# Patient Record
Sex: Female | Born: 1950 | Race: White | Hispanic: No | Marital: Married | Smoking: Never smoker
Health system: Southern US, Community
[De-identification: ages and names within clinical notes are randomized; demographics above are authoritative.]

## PROBLEM LIST (undated history)

## (undated) DIAGNOSIS — I429 Cardiomyopathy, unspecified: Secondary | ICD-10-CM

## (undated) DIAGNOSIS — I4891 Unspecified atrial fibrillation: Secondary | ICD-10-CM

## (undated) DIAGNOSIS — M8580 Other specified disorders of bone density and structure, unspecified site: Secondary | ICD-10-CM

## (undated) DIAGNOSIS — E785 Hyperlipidemia, unspecified: Secondary | ICD-10-CM

## (undated) DIAGNOSIS — I10 Essential (primary) hypertension: Secondary | ICD-10-CM

## (undated) HISTORY — PX: AUGMENTATION MAMMAPLASTY: SUR837

## (undated) HISTORY — DX: Hyperlipidemia, unspecified: E78.5

## (undated) HISTORY — DX: Cardiomyopathy, unspecified: I42.9

## (undated) HISTORY — DX: Essential (primary) hypertension: I10

## (undated) HISTORY — DX: Other specified disorders of bone density and structure, unspecified site: M85.80

## (undated) HISTORY — DX: Unspecified atrial fibrillation: I48.91

## (undated) HISTORY — PX: BREAST EXCISIONAL BIOPSY: SUR124

---

## 1998-09-06 ENCOUNTER — Other Ambulatory Visit: Admission: RE | Admit: 1998-09-06 | Discharge: 1998-09-06 | Payer: Self-pay | Admitting: Obstetrics & Gynecology

## 1999-10-18 ENCOUNTER — Encounter: Admission: RE | Admit: 1999-10-18 | Discharge: 1999-10-18 | Payer: Self-pay | Admitting: Obstetrics & Gynecology

## 1999-10-18 ENCOUNTER — Encounter: Payer: Self-pay | Admitting: Obstetrics & Gynecology

## 1999-11-15 ENCOUNTER — Other Ambulatory Visit: Admission: RE | Admit: 1999-11-15 | Discharge: 1999-11-15 | Payer: Self-pay | Admitting: Obstetrics & Gynecology

## 1999-11-22 ENCOUNTER — Encounter: Payer: Self-pay | Admitting: Obstetrics & Gynecology

## 1999-11-22 ENCOUNTER — Encounter: Admission: RE | Admit: 1999-11-22 | Discharge: 1999-11-22 | Payer: Self-pay | Admitting: Obstetrics & Gynecology

## 2000-10-30 ENCOUNTER — Encounter: Admission: RE | Admit: 2000-10-30 | Discharge: 2000-10-30 | Payer: Self-pay | Admitting: Obstetrics & Gynecology

## 2000-10-30 ENCOUNTER — Encounter: Payer: Self-pay | Admitting: Obstetrics & Gynecology

## 2000-12-04 ENCOUNTER — Other Ambulatory Visit: Admission: RE | Admit: 2000-12-04 | Discharge: 2000-12-04 | Payer: Self-pay | Admitting: Obstetrics & Gynecology

## 2002-01-07 ENCOUNTER — Encounter: Admission: RE | Admit: 2002-01-07 | Discharge: 2002-01-07 | Payer: Self-pay | Admitting: Obstetrics & Gynecology

## 2002-01-07 ENCOUNTER — Encounter: Payer: Self-pay | Admitting: Obstetrics & Gynecology

## 2002-02-25 ENCOUNTER — Other Ambulatory Visit: Admission: RE | Admit: 2002-02-25 | Discharge: 2002-02-25 | Payer: Self-pay | Admitting: Obstetrics & Gynecology

## 2003-03-04 ENCOUNTER — Encounter: Admission: RE | Admit: 2003-03-04 | Discharge: 2003-03-04 | Payer: Self-pay | Admitting: Obstetrics & Gynecology

## 2003-03-09 ENCOUNTER — Other Ambulatory Visit: Admission: RE | Admit: 2003-03-09 | Discharge: 2003-03-09 | Payer: Self-pay | Admitting: Obstetrics & Gynecology

## 2004-03-01 ENCOUNTER — Ambulatory Visit: Payer: Self-pay | Admitting: Internal Medicine

## 2004-03-23 ENCOUNTER — Encounter: Admission: RE | Admit: 2004-03-23 | Discharge: 2004-03-23 | Payer: Self-pay | Admitting: Obstetrics & Gynecology

## 2004-04-05 ENCOUNTER — Other Ambulatory Visit: Admission: RE | Admit: 2004-04-05 | Discharge: 2004-04-05 | Payer: Self-pay | Admitting: Obstetrics & Gynecology

## 2005-04-11 ENCOUNTER — Encounter: Admission: RE | Admit: 2005-04-11 | Discharge: 2005-04-11 | Payer: Self-pay | Admitting: Obstetrics & Gynecology

## 2005-04-19 ENCOUNTER — Other Ambulatory Visit: Admission: RE | Admit: 2005-04-19 | Discharge: 2005-04-19 | Payer: Self-pay | Admitting: Obstetrics & Gynecology

## 2005-12-12 ENCOUNTER — Ambulatory Visit: Payer: Self-pay | Admitting: Internal Medicine

## 2005-12-26 ENCOUNTER — Ambulatory Visit: Payer: Self-pay | Admitting: Internal Medicine

## 2006-02-04 ENCOUNTER — Ambulatory Visit: Payer: Self-pay | Admitting: Internal Medicine

## 2006-03-05 ENCOUNTER — Encounter: Payer: Self-pay | Admitting: Internal Medicine

## 2006-03-05 ENCOUNTER — Ambulatory Visit: Payer: Self-pay | Admitting: Internal Medicine

## 2006-03-05 LAB — HM COLONOSCOPY

## 2006-03-07 ENCOUNTER — Ambulatory Visit: Payer: Self-pay | Admitting: Internal Medicine

## 2006-04-17 ENCOUNTER — Encounter: Admission: RE | Admit: 2006-04-17 | Discharge: 2006-04-17 | Payer: Self-pay | Admitting: Obstetrics & Gynecology

## 2007-05-01 ENCOUNTER — Encounter: Admission: RE | Admit: 2007-05-01 | Discharge: 2007-05-01 | Payer: Self-pay | Admitting: Obstetrics & Gynecology

## 2008-01-07 ENCOUNTER — Ambulatory Visit: Payer: Self-pay | Admitting: Internal Medicine

## 2008-05-19 ENCOUNTER — Encounter: Admission: RE | Admit: 2008-05-19 | Discharge: 2008-05-19 | Payer: Self-pay | Admitting: Obstetrics & Gynecology

## 2008-10-28 ENCOUNTER — Ambulatory Visit: Payer: Self-pay | Admitting: Internal Medicine

## 2008-10-28 DIAGNOSIS — M25559 Pain in unspecified hip: Secondary | ICD-10-CM | POA: Insufficient documentation

## 2009-06-09 ENCOUNTER — Encounter: Admission: RE | Admit: 2009-06-09 | Discharge: 2009-06-09 | Payer: Self-pay | Admitting: Obstetrics & Gynecology

## 2010-04-25 NOTE — Procedures (Signed)
Summary: Colonoscopy Report/[facility]  Colonoscopy Report/[facility]   Imported By: Maryln Gottron 09/09/2009 08:34:36  _____________________________________________________________________  External Attachment:    Type:   Image     Comment:   External Document

## 2010-05-04 ENCOUNTER — Other Ambulatory Visit: Payer: Self-pay | Admitting: Obstetrics & Gynecology

## 2010-05-04 DIAGNOSIS — Z1231 Encounter for screening mammogram for malignant neoplasm of breast: Secondary | ICD-10-CM

## 2010-06-14 ENCOUNTER — Ambulatory Visit
Admission: RE | Admit: 2010-06-14 | Discharge: 2010-06-14 | Disposition: A | Discharge status: Home / Self Care | Payer: BC Managed Care – PPO | Source: Ambulatory Visit | Attending: Obstetrics & Gynecology | Admitting: Obstetrics & Gynecology

## 2010-06-14 DIAGNOSIS — Z1231 Encounter for screening mammogram for malignant neoplasm of breast: Secondary | ICD-10-CM

## 2010-08-11 NOTE — Assessment & Plan Note (Signed)
[facility]                              [facility] OFFICE NOTE   Shelly Bowers, Shelly Bowers                    MRN:          [id number]  DATE:12/26/2005                            DOB:          12/25/1950    A 60 year old female seen today for a wellness exam.  She enjoys excellent  health and receives gynecologic care from Dr. Aldona Bar.  Does have a history of  mild hypercholesterolemia, and statin therapy has been offered in the past.   Past medical history is fairly unremarkable.  Has had a breast adenoma  resected and a childhood tonsillectomy, otherwise no hospital admissions.  She is on hormone replacement therapy.   FAMILY HISTORY:  Noncontributory.  Both parents are still doing reasonably  well.  One brother remains healthy.   SOCIAL HISTORY:  Married.  No children.  Nonsmoker.   PHYSICAL EXAMINATION:  GENERAL:  Exam revealed a well-developed, mildly  overweight female.  VITAL SIGNS:  Blood pressure 130/90.  Home blood pressure monitoring by her  husband, who has hypertension, is usually in a low-normal range.  HEENT:  Fundi, ears, nose, and throat clear.  NECK:  No bruits or adenopathy.  CHEST:  Clear.  BREASTS:  Negative.  CARDIOVASCULAR:  Normal heart sounds.  No murmurs.  ABDOMEN:  Benign.  No organomegaly.  No bruits.  EXTREMITIES:  Full peripheral pulses.   IMPRESSION:  Mild hypercholesterolemia, mild obesity, menopausal syndrome.   DISPOSITION:  Options were discussed, including statin therapy.  This she  will consider.  I have also set her up for a screening colonoscopy.            ______________________________  Gordy Savers, MD     PFK/MedQ  DD:  12/26/2005  DT:  12/27/2005  Job #:  [id number]

## 2011-03-07 ENCOUNTER — Ambulatory Visit (INDEPENDENT_AMBULATORY_CARE_PROVIDER_SITE_OTHER): Payer: BC Managed Care – PPO | Admitting: Internal Medicine

## 2011-03-07 DIAGNOSIS — Z23 Encounter for immunization: Secondary | ICD-10-CM

## 2011-03-14 ENCOUNTER — Ambulatory Visit (INDEPENDENT_AMBULATORY_CARE_PROVIDER_SITE_OTHER): Payer: BC Managed Care – PPO | Admitting: Internal Medicine

## 2011-03-14 ENCOUNTER — Encounter: Payer: Self-pay | Admitting: Internal Medicine

## 2011-03-14 VITALS — BP 110/70 | Temp 98.5°F | Ht 67.0 in | Wt 175.0 lb

## 2011-03-14 DIAGNOSIS — Z0000 Encounter for general adult medical examination without abnormal findings: Secondary | ICD-10-CM

## 2011-03-14 DIAGNOSIS — M19049 Primary osteoarthritis, unspecified hand: Secondary | ICD-10-CM

## 2011-03-14 DIAGNOSIS — Z2911 Encounter for prophylactic immunotherapy for respiratory syncytial virus (RSV): Secondary | ICD-10-CM

## 2011-03-14 DIAGNOSIS — Z23 Encounter for immunization: Secondary | ICD-10-CM

## 2011-03-14 MED ORDER — TRIAMCINOLONE ACETONIDE 0.1 % EX CREA: TOPICAL_CREAM | Freq: Two times a day (BID) | CUTANEOUS | Status: AC

## 2011-03-14 MED ORDER — TRIAMCINOLONE ACETONIDE 0.1 % EX CREA: TOPICAL_CREAM | Freq: Two times a day (BID) | CUTANEOUS | Status: DC

## 2011-03-14 NOTE — Patient Instructions (Signed)
Limit your sodium (Salt) intake    It is important that you exercise regularly, at least 20 minutes 3 to 4 times per week.  If you develop chest pain or shortness of breath seek  medical attention.  Return in one year for follow-up  Annual gynecologic followup

## 2011-03-15 ENCOUNTER — Other Ambulatory Visit (INDEPENDENT_AMBULATORY_CARE_PROVIDER_SITE_OTHER): Payer: BC Managed Care – PPO

## 2011-03-15 ENCOUNTER — Encounter: Payer: Self-pay | Admitting: Internal Medicine

## 2011-03-15 DIAGNOSIS — Z0000 Encounter for general adult medical examination without abnormal findings: Secondary | ICD-10-CM | POA: Insufficient documentation

## 2011-03-15 LAB — CBC WITH DIFFERENTIAL/PLATELET
Basophils Absolute: 0 10*3/uL (ref 0.0–0.1)
Basophils Relative: 0.6 % (ref 0.0–3.0)
Eosinophils Absolute: 0.2 10*3/uL (ref 0.0–0.7)
Eosinophils Relative: 4.1 % (ref 0.0–5.0)
HCT: 37.4 % (ref 36.0–46.0)
Hemoglobin: 12.7 g/dL (ref 12.0–15.0)
Lymphocytes Relative: 33.6 % (ref 12.0–46.0)
Lymphs Abs: 1.6 10*3/uL (ref 0.7–4.0)
MCHC: 34 g/dL (ref 30.0–36.0)
MCV: 98.9 fl (ref 78.0–100.0)
Monocytes Absolute: 0.4 10*3/uL (ref 0.1–1.0)
Monocytes Relative: 8.1 % (ref 3.0–12.0)
Neutro Abs: 2.5 10*3/uL (ref 1.4–7.7)
Neutrophils Relative %: 53.6 % (ref 43.0–77.0)
Platelets: 189 10*3/uL (ref 150.0–400.0)
RBC: 3.78 Mil/uL — ABNORMAL LOW (ref 3.87–5.11)
RDW: 13.6 % (ref 11.5–14.6)
WBC: 4.7 10*3/uL (ref 4.5–10.5)

## 2011-03-15 LAB — LIPID PANEL
Cholesterol: 296 mg/dL — ABNORMAL HIGH (ref 0–200)
HDL: 65.1 mg/dL (ref >39.00)
Total CHOL/HDL Ratio: 5
Triglycerides: 119 mg/dL (ref 0.0–149.0)
VLDL: 23.8 mg/dL (ref 0.0–40.0)

## 2011-03-15 LAB — COMPREHENSIVE METABOLIC PANEL
ALT: 18 U/L (ref 0–35)
AST: 21 U/L (ref 0–37)
Albumin: 4.4 g/dL (ref 3.5–5.2)
Alkaline Phosphatase: 57 U/L (ref 39–117)
BUN: 16 mg/dL (ref 6–23)
CO2: 26 mEq/L (ref 19–32)
Calcium: 9.5 mg/dL (ref 8.4–10.5)
Chloride: 107 mEq/L (ref 96–112)
Creatinine, Ser: 0.7 mg/dL (ref 0.4–1.2)
GFR: 88.39 mL/min (ref >60.00)
Glucose, Bld: 88 mg/dL (ref 70–99)
Potassium: 4.5 mEq/L (ref 3.5–5.1)
Sodium: 141 mEq/L (ref 135–145)
Total Bilirubin: 0.8 mg/dL (ref 0.3–1.2)
Total Protein: 6.9 g/dL (ref 6.0–8.3)

## 2011-03-15 LAB — LDL CHOLESTEROL, DIRECT: Direct LDL: 198.4 mg/dL

## 2011-03-15 LAB — TSH: TSH: 1.39 u[IU]/mL (ref 0.35–5.50)

## 2011-03-15 NOTE — Progress Notes (Signed)
  Subjective:    Patient ID: Shelly Bowers, female    DOB: 08/24/1950, 60 y.o.   MRN: [id number]  HPI 60 year old patient who is seen today for followup she has a history of left hip pain which has been stable. She has been treated with Celebrex in the past she does obtain regular gynecologic followup. No major concerns or complaints today      Review of Systems  Constitutional: Negative.   HENT: Negative for hearing loss, congestion, sore throat, rhinorrhea, dental problem, sinus pressure and tinnitus.   Eyes: Negative for pain, discharge and visual disturbance.  Respiratory: Negative for cough and shortness of breath.   Cardiovascular: Negative for chest pain, palpitations and leg swelling.  Gastrointestinal: Negative for nausea, vomiting, abdominal pain, diarrhea, constipation, blood in stool and abdominal distention.  Genitourinary: Negative for dysuria, urgency, frequency, hematuria, flank pain, vaginal bleeding, vaginal discharge, difficulty urinating, vaginal pain and pelvic pain.  Musculoskeletal: Negative for joint swelling, arthralgias and gait problem.  Skin: Negative for rash.  Neurological: Negative for dizziness, syncope, speech difficulty, weakness, numbness and headaches.  Hematological: Negative for adenopathy.  Psychiatric/Behavioral: Negative for behavioral problems, dysphoric mood and agitation. The patient is not nervous/anxious.        Objective:   Physical Exam  Constitutional: She is oriented to person, place, and time. She appears well-developed and well-nourished.  HENT:  Head: Normocephalic.  Right Ear: External ear normal.  Left Ear: External ear normal.  Mouth/Throat: Oropharynx is clear and moist.  Eyes: Conjunctivae and EOM are normal. Pupils are equal, round, and reactive to light.  Neck: Normal range of motion. Neck supple. No thyromegaly present.  Cardiovascular: Normal rate, regular rhythm, normal heart sounds and intact distal pulses.     Pulmonary/Chest: Effort normal and breath sounds normal.  Abdominal: Soft. Bowel sounds are normal. She exhibits no mass. There is no tenderness.  Musculoskeletal: Normal range of motion.  Lymphadenopathy:    She has no cervical adenopathy.  Neurological: She is alert and oriented to person, place, and time.  Skin: Skin is warm and dry. No rash noted.  Psychiatric: She has a normal mood and affect. Her behavior is normal.          Assessment & Plan:  Health maintenance exam We'll continue gynecologic followup Celebrex refilled Return here in one year or when necessary

## 2011-03-16 NOTE — Progress Notes (Signed)
Quick Note:  Attempt to call- VM - LMTCB if question- gave dr. Vernon Prey instructions and repeat lab in4 months ______

## 2011-05-28 ENCOUNTER — Other Ambulatory Visit: Payer: Self-pay | Admitting: Obstetrics & Gynecology

## 2011-05-28 DIAGNOSIS — Z1231 Encounter for screening mammogram for malignant neoplasm of breast: Secondary | ICD-10-CM

## 2011-06-20 ENCOUNTER — Ambulatory Visit
Admission: RE | Admit: 2011-06-20 | Discharge: 2011-06-20 | Disposition: A | Discharge status: Home / Self Care | Payer: BC Managed Care – PPO | Source: Ambulatory Visit | Attending: Obstetrics & Gynecology | Admitting: Obstetrics & Gynecology

## 2011-06-20 DIAGNOSIS — Z1231 Encounter for screening mammogram for malignant neoplasm of breast: Secondary | ICD-10-CM

## 2011-11-01 ENCOUNTER — Telehealth: Payer: Self-pay | Admitting: Internal Medicine

## 2011-11-01 DIAGNOSIS — L03039 Cellulitis of unspecified toe: Secondary | ICD-10-CM

## 2011-11-01 NOTE — Telephone Encounter (Signed)
Patient called stating that she need a referral to a Podiatrist as her great toenails are separating from her nail bed and there is some fluid present. Please advise.

## 2011-11-01 NOTE — Telephone Encounter (Signed)
Order done

## 2012-01-23 ENCOUNTER — Ambulatory Visit (INDEPENDENT_AMBULATORY_CARE_PROVIDER_SITE_OTHER): Payer: BC Managed Care – PPO | Admitting: Internal Medicine

## 2012-01-23 DIAGNOSIS — Z23 Encounter for immunization: Secondary | ICD-10-CM

## 2012-01-24 ENCOUNTER — Ambulatory Visit: Payer: BC Managed Care – PPO | Admitting: Internal Medicine

## 2012-06-30 ENCOUNTER — Other Ambulatory Visit: Payer: Self-pay

## 2012-06-30 DIAGNOSIS — Z1231 Encounter for screening mammogram for malignant neoplasm of breast: Secondary | ICD-10-CM

## 2012-07-24 ENCOUNTER — Ambulatory Visit
Admission: RE | Admit: 2012-07-24 | Discharge: 2012-07-24 | Disposition: A | Discharge status: Home / Self Care | Payer: BC Managed Care – PPO | Source: Ambulatory Visit

## 2012-07-24 DIAGNOSIS — Z1231 Encounter for screening mammogram for malignant neoplasm of breast: Secondary | ICD-10-CM

## 2013-02-12 ENCOUNTER — Ambulatory Visit (INDEPENDENT_AMBULATORY_CARE_PROVIDER_SITE_OTHER): Payer: BC Managed Care – PPO

## 2013-02-12 DIAGNOSIS — Z23 Encounter for immunization: Secondary | ICD-10-CM

## 2013-04-14 ENCOUNTER — Other Ambulatory Visit (INDEPENDENT_AMBULATORY_CARE_PROVIDER_SITE_OTHER): Payer: Medicare Other

## 2013-04-14 DIAGNOSIS — I10 Essential (primary) hypertension: Secondary | ICD-10-CM

## 2013-04-14 DIAGNOSIS — E785 Hyperlipidemia, unspecified: Secondary | ICD-10-CM

## 2013-04-14 DIAGNOSIS — E039 Hypothyroidism, unspecified: Secondary | ICD-10-CM

## 2013-04-14 LAB — BASIC METABOLIC PANEL
BUN: 13 mg/dL (ref 6–23)
CO2: 29 mEq/L (ref 19–32)
Calcium: 9.8 mg/dL (ref 8.4–10.5)
Chloride: 102 mEq/L (ref 96–112)
Creatinine, Ser: 0.7 mg/dL (ref 0.4–1.2)
GFR: 83.71 mL/min (ref >60.00)
Glucose, Bld: 90 mg/dL (ref 70–99)
Potassium: 5.5 mEq/L — ABNORMAL HIGH (ref 3.5–5.1)
Sodium: 140 mEq/L (ref 135–145)

## 2013-04-14 LAB — POCT URINALYSIS DIPSTICK
Bilirubin, UA: NEGATIVE
Blood, UA: NEGATIVE
Glucose, UA: NEGATIVE
Ketones, UA: NEGATIVE
Nitrite, UA: NEGATIVE
Protein, UA: NEGATIVE
Spec Grav, UA: 1.015
Urobilinogen, UA: 1
pH, UA: 7

## 2013-04-14 LAB — CBC WITH DIFFERENTIAL/PLATELET
Basophils Absolute: 0 10*3/uL (ref 0.0–0.1)
Basophils Relative: 0.4 % (ref 0.0–3.0)
Eosinophils Absolute: 0.1 10*3/uL (ref 0.0–0.7)
Eosinophils Relative: 2.8 % (ref 0.0–5.0)
HCT: 40.7 % (ref 36.0–46.0)
Hemoglobin: 13.7 g/dL (ref 12.0–15.0)
Lymphocytes Relative: 30.7 % (ref 12.0–46.0)
Lymphs Abs: 1.4 10*3/uL (ref 0.7–4.0)
MCHC: 33.7 g/dL (ref 30.0–36.0)
MCV: 99.2 fl (ref 78.0–100.0)
Monocytes Absolute: 0.5 10*3/uL (ref 0.1–1.0)
Monocytes Relative: 10.6 % (ref 3.0–12.0)
Neutro Abs: 2.5 10*3/uL (ref 1.4–7.7)
Neutrophils Relative %: 55.5 % (ref 43.0–77.0)
Platelets: 172 10*3/uL (ref 150.0–400.0)
RBC: 4.1 Mil/uL (ref 3.87–5.11)
RDW: 13.4 % (ref 11.5–14.6)
WBC: 4.6 10*3/uL (ref 4.5–10.5)

## 2013-04-14 LAB — TSH: TSH: 1.51 u[IU]/mL (ref 0.35–5.50)

## 2013-04-14 LAB — LIPID PANEL
Cholesterol: 265 mg/dL — ABNORMAL HIGH (ref 0–200)
HDL: 57.9 mg/dL (ref >39.00)
Total CHOL/HDL Ratio: 5
Triglycerides: 130 mg/dL (ref 0.0–149.0)
VLDL: 26 mg/dL (ref 0.0–40.0)

## 2013-04-14 LAB — HEPATIC FUNCTION PANEL
ALT: 28 U/L (ref 0–35)
AST: 30 U/L (ref 0–37)
Albumin: 4.3 g/dL (ref 3.5–5.2)
Alkaline Phosphatase: 101 U/L (ref 39–117)
Bilirubin, Direct: 0.1 mg/dL (ref 0.0–0.3)
Total Bilirubin: 1.1 mg/dL (ref 0.3–1.2)
Total Protein: 7.4 g/dL (ref 6.0–8.3)

## 2013-04-14 LAB — LDL CHOLESTEROL, DIRECT: Direct LDL: 191.9 mg/dL

## 2013-04-20 ENCOUNTER — Encounter: Payer: Self-pay | Admitting: Internal Medicine

## 2013-04-20 ENCOUNTER — Ambulatory Visit (INDEPENDENT_AMBULATORY_CARE_PROVIDER_SITE_OTHER): Payer: BC Managed Care – PPO | Admitting: Internal Medicine

## 2013-04-20 VITALS — BP 122/80 | HR 63 | Temp 98.5°F | Resp 20 | Ht 64.75 in | Wt 168.0 lb

## 2013-04-20 DIAGNOSIS — M25559 Pain in unspecified hip: Secondary | ICD-10-CM

## 2013-04-20 DIAGNOSIS — Z0000 Encounter for general adult medical examination without abnormal findings: Secondary | ICD-10-CM

## 2013-04-20 DIAGNOSIS — E785 Hyperlipidemia, unspecified: Secondary | ICD-10-CM | POA: Insufficient documentation

## 2013-04-20 NOTE — Progress Notes (Signed)
Pre-visit discussion using our clinic review tool. No additional management support is needed unless otherwise documented below in the visit note.  

## 2013-04-20 NOTE — Progress Notes (Signed)
Subjective:    Patient ID: Shelly Bowers, female    DOB: 1950-08-15, 63 y.o.   MRN: [id number]  HPI 63 -year-old patient who is seen today for a preventive health examination. She is followed annually by Dr. Stann Mainland and also by orthopedic surgery. Her main complaint today is worsening left hip pain. She does have a history of osteoarthritis. She remains on Celebrex daily.  Colonoscopy 07/08/05  Family history father died at age 80 history of COPD and atrial fibrillation Mother age 81 with senile dementia one brother age 73 with atrial fibrillation  History reviewed. No pertinent past medical history.  History   Social History  . Marital Status: Married    Spouse Name: N/A    Number of Children: N/A  . Years of Education: N/A   Occupational History  . Not on file.   Social History Main Topics  . Smoking status: Never Smoker   . Smokeless tobacco: Never Used  . Alcohol Use: Yes  . Drug Use: Not on file  . Sexual Activity: Not on file   Other Topics Concern  . Not on file   Social History Narrative  . No narrative on file    History reviewed. No pertinent past surgical history.  No family history on file.  Allergies  Allergen Reactions  . Codeine Sulfate     REACTION: unspecified    Current Outpatient Prescriptions on File Prior to Visit  Medication Sig Dispense Refill  . CELEBREX 200 MG capsule Take 200 mg by mouth 2 (two) times daily as needed.       Marland Kitchen ibuprofen (ADVIL,MOTRIN) 200 MG tablet Take 200 mg by mouth every 6 (six) hours as needed.         No current facility-administered medications on file prior to visit.    BP 122/80  Pulse 63  Temp(Src) 98.5 F (36.9 C) (Oral)  Resp 20  Ht 5' 4.75" (1.645 m)  Wt 168 lb (76.204 kg)  BMI 28.16 kg/m2  SpO2 97%  1. Risk factors, based on past  M,S,F history- no cardiovascular risk factors except for her dyslipidemia  2.  Physical activities:  Limited by arthritis especially left hip pain  3.   Depression/mood: No history depression and mood disorder  4.  Hearing: No deficits  5.  ADL's: Independent in all aspects of daily living  6.  Fall risk: Increase to 2 left hip pain  7.  Home safety: No problems identified  8.  Height weight, and visual acuity; height and weight stable no change in visual acuity 9.  Counseling: Heart healthy diet and regular exercise regimen encouraged  10. Lab orders based on risk factors: Laboratory profile reviewed. Dyslipidemia and possible treatment discussed at length  11. Referral : Followup orthopedics an annual gynecologic examinations  12. Care plan: As above. Patient does not wish to start statin therapy at this time  13. Cognitive assessment: Alert and oriented with normal affect. No cognitive dysfunction       Review of Systems  Constitutional: Negative.  Negative for fever, appetite change, fatigue and unexpected weight change.  HENT: Negative for congestion, dental problem, ear pain, hearing loss, mouth sores, nosebleeds, rhinorrhea, sinus pressure, sore throat, tinnitus, trouble swallowing and voice change.   Eyes: Negative for photophobia, pain, discharge, redness and visual disturbance.  Respiratory: Negative for cough, chest tightness and shortness of breath.   Cardiovascular: Negative for chest pain, palpitations and leg swelling.  Gastrointestinal: Negative for nausea, vomiting, abdominal pain, diarrhea, constipation,  blood in stool, abdominal distention and rectal pain.  Genitourinary: Negative for dysuria, urgency, frequency, hematuria, flank pain, vaginal bleeding, vaginal discharge, difficulty urinating, genital sores, vaginal pain, menstrual problem and pelvic pain.  Musculoskeletal: Positive for arthralgias, gait problem and joint swelling. Negative for back pain and neck stiffness.  Skin: Negative for rash.  Neurological: Negative for dizziness, syncope, speech difficulty, weakness, light-headedness, numbness and  headaches.  Hematological: Negative for adenopathy. Does not bruise/bleed easily.  Psychiatric/Behavioral: Negative for suicidal ideas, behavioral problems, self-injury, dysphoric mood and agitation. The patient is not nervous/anxious.        Objective:   Physical Exam  Constitutional: She is oriented to person, place, and time. She appears well-developed and well-nourished.  HENT:  Head: Normocephalic and atraumatic.  Right Ear: External ear normal.  Left Ear: External ear normal.  Mouth/Throat: Oropharynx is clear and moist.  Eyes: Conjunctivae and EOM are normal. Pupils are equal, round, and reactive to light.  Neck: Normal range of motion. Neck supple. No JVD present. No thyromegaly present.  Cardiovascular: Normal rate, regular rhythm, normal heart sounds and intact distal pulses.   No murmur heard. Pulmonary/Chest: Effort normal and breath sounds normal. She has no wheezes. She has no rales.  Abdominal: Soft. Bowel sounds are normal. She exhibits no distension and no mass. There is no tenderness. There is no rebound and no guarding.  Musculoskeletal: Normal range of motion. She exhibits no edema and no tenderness.  Lymphadenopathy:    She has no cervical adenopathy.  Neurological: She is alert and oriented to person, place, and time. She has normal reflexes. No cranial nerve deficit. She exhibits normal muscle tone. Coordination normal.  Skin: Skin is warm and dry. No rash noted.  Psychiatric: She has a normal mood and affect. Her behavior is normal.          Assessment & Plan:  Health maintenance exam We'll continue gynecologic followup Celebrex refilled Return here in one year or when necessary Dyslipidemia.  The option of starting statin therapy discussed with LDL cholesterol greater than 190. She wishes to defer at this time

## 2013-04-20 NOTE — Patient Instructions (Addendum)
It is important that you exercise regularly, at least 20 minutes 3 to 4 times per week.  If you develop chest pain or shortness of breath seek  medical attention.  Return in one year for follow-up Osteoarthritis Osteoarthritis is a disease that causes soreness and swelling (inflammation) of a joint. It occurs when the cartilage at the affected joint wears down. Cartilage acts as a cushion, covering the ends of bones where they meet to form a joint. Osteoarthritis is the most common form of arthritis. It often occurs in older people. The joints affected most often by this condition include those in the:  Ends of the fingers.  Thumbs.  Neck.  Lower back.  Knees.  Hips. CAUSES  Over time, the cartilage that covers the ends of bones begins to wear away. This causes bone to rub on bone, producing pain and stiffness in the affected joints.  RISK FACTORS Certain factors can increase your chances of having osteoarthritis, including:  Older age.  Excessive body weight.  Overuse of joints. SIGNS AND SYMPTOMS   Pain, swelling, and stiffness in the joint.  Over time, the joint may lose its normal shape.  Small deposits of bone (osteophytes) may grow on the edges of the joint.  Bits of bone or cartilage can break off and float inside the joint space. This may cause more pain and damage. DIAGNOSIS  Your health care provider will do a physical exam and ask about your symptoms. Various tests may be ordered, such as:  X-rays of the affected joint.  An MRI scan.  Blood tests to rule out other types of arthritis.  Joint fluid tests. This involves using a needle to draw fluid from the joint and examining the fluid under a microscope. TREATMENT  Goals of treatment are to control pain and improve joint function. Treatment plans may include:  A prescribed exercise program that allows for rest and joint relief.  A weight control plan.  Pain relief techniques, such as:  Properly  applied heat and cold.  Electric pulses delivered to nerve endings under the skin (transcutaneous electrical nerve stimulation, TENS).  Massage.  Certain nutritional supplements.  Medicines to control pain, such as:  Acetaminophen.  Nonsteroidal anti-inflammatory drugs (NSAIDs), such as naproxen.  Narcotic or central-acting agents, such as tramadol.  Corticosteroids. These can be given orally or as an injection.  Surgery to reposition the bones and relieve pain (osteotomy) or to remove loose pieces of bone and cartilage. Joint replacement may be needed in advanced states of osteoarthritis. HOME CARE INSTRUCTIONS   Only take over-the-counter or prescription medicines as directed by your health care provider. Take all medicines exactly as instructed.  Maintain a healthy weight. Follow your health care provider's instructions for weight control. This may include dietary instructions.  Exercise as directed. Your health care provider can recommend specific types of exercise. These may include:  Strengthening exercises These are done to strengthen the muscles that support joints affected by arthritis. They can be performed with weights or with exercise bands to add resistance.  Aerobic activities These are exercises, such as brisk walking or low-impact aerobics, that get your heart pumping.  Range-of-motion activities These keep your joints limber.  Balance and agility exercises These help you maintain daily living skills.  Rest your affected joints as directed by your health care provider.  Follow up with your health care provider as directed. SEEK MEDICAL CARE IF:   Your skin turns red.  You develop a rash in addition to  your joint pain.  You have worsening joint pain. SEEK IMMEDIATE MEDICAL CARE IF:  You have a significant loss of weight or appetite.  You have a fever along with joint or muscle aches.  You have night sweats. [place]  of Arthritis and Musculoskeletal and Skin Diseases: www.niams.SouthExposed.es [employer] on Aging: http://kim-miller.com/ American College of Rheumatology: www.rheumatology.org Document Released: 03/12/2005 Document Revised: 12/31/2012 Document Reviewed: 11/17/2012 [facility] Patient Information 2014 [place], [place].

## 2013-06-10 ENCOUNTER — Other Ambulatory Visit: Payer: Self-pay

## 2013-06-10 DIAGNOSIS — Z1231 Encounter for screening mammogram for malignant neoplasm of breast: Secondary | ICD-10-CM

## 2013-06-17 ENCOUNTER — Ambulatory Visit (INDEPENDENT_AMBULATORY_CARE_PROVIDER_SITE_OTHER): Payer: BC Managed Care – PPO | Admitting: *Deleted

## 2013-06-17 DIAGNOSIS — Z23 Encounter for immunization: Secondary | ICD-10-CM

## 2013-07-29 ENCOUNTER — Encounter (INDEPENDENT_AMBULATORY_CARE_PROVIDER_SITE_OTHER): Payer: Self-pay

## 2013-07-29 ENCOUNTER — Ambulatory Visit
Admission: RE | Admit: 2013-07-29 | Discharge: 2013-07-29 | Disposition: A | Discharge status: Home / Self Care | Payer: BC Managed Care – PPO | Source: Ambulatory Visit

## 2013-07-29 DIAGNOSIS — Z1231 Encounter for screening mammogram for malignant neoplasm of breast: Secondary | ICD-10-CM

## 2013-09-11 ENCOUNTER — Ambulatory Visit (INDEPENDENT_AMBULATORY_CARE_PROVIDER_SITE_OTHER): Payer: BC Managed Care – PPO | Admitting: Internal Medicine

## 2013-09-11 ENCOUNTER — Telehealth: Payer: Self-pay | Admitting: Internal Medicine

## 2013-09-11 ENCOUNTER — Encounter: Payer: Self-pay | Admitting: Internal Medicine

## 2013-09-11 VITALS — BP 179/91 | HR 66 | Temp 99.2°F | Ht 64.75 in | Wt 172.0 lb

## 2013-09-11 DIAGNOSIS — T148 Other injury of unspecified body region: Secondary | ICD-10-CM

## 2013-09-11 DIAGNOSIS — W57XXXA Bitten or stung by nonvenomous insect and other nonvenomous arthropods, initial encounter: Secondary | ICD-10-CM

## 2013-09-11 NOTE — Telephone Encounter (Signed)
Call back attempted at 1645 on 6-19, Pt was already at Office waiting to be seen.

## 2013-09-11 NOTE — Progress Notes (Signed)
   Subjective:    Patient ID: Shelly Bowers, female    DOB: [birth year], 63 y.o.   MRN: [id number]  HPI  63 year old patient, who presents with a chief complaint of numerous insect bites, primarily over the anterior abdominal area, and upper inner thighs.  Her husband has had similar issues.  They have done some research and believes these are caused by mites.  She was concerned about a lesion in the umbilical area.  The lesions are quite pruritic  No past medical history on file.  History   Social History  . Marital Status: Married    Spouse Name: N/A    Number of Children: N/A  . Years of Education: N/A   Occupational History  . Not on file.   Social History Main Topics  . Smoking status: Never Smoker   . Smokeless tobacco: Never Used  . Alcohol Use: Yes     Comment: occ  . Drug Use: No  . Sexual Activity: Not on file   Other Topics Concern  . Not on file   Social History Narrative  . No narrative on file    No past surgical history on file.  No family history on file.  Allergies  Allergen Reactions  . Codeine Sulfate     REACTION: unspecified    Current Outpatient Prescriptions on File Prior to Visit  Medication Sig Dispense Refill  . CELEBREX 200 MG capsule Take 200 mg by mouth 2 (two) times daily as needed.       Marland Kitchen ibuprofen (ADVIL,MOTRIN) 200 MG tablet Take 200 mg by mouth every 6 (six) hours as needed.         No current facility-administered medications on file prior to visit.    BP 179/91  Pulse 66  Temp(Src) 99.2 F (37.3 C)  Ht 5' 4.75" (1.645 m)  Wt 172 lb (78.019 kg)  BMI 28.83 kg/m2       Review of Systems  Skin: Positive for rash.       Objective:   Physical Exam  Constitutional: She appears well-nourished. No distress.  Skin:  Scattered erythematous maculopapular lesions with some scaling in and around the umbilicus and upper inner thighs           Assessment & Plan:   Multiple insect bites.  Local skin care  discussed

## 2013-09-11 NOTE — Progress Notes (Signed)
Pre visit review using our clinic review tool, if applicable. No additional management support is needed unless otherwise documented below in the visit note. 

## 2013-09-11 NOTE — Telephone Encounter (Signed)
Noted  

## 2013-09-11 NOTE — Patient Instructions (Signed)
Call or return to clinic prn if these symptoms worsen or fail to improve as anticipated.

## 2013-12-04 ENCOUNTER — Ambulatory Visit (INDEPENDENT_AMBULATORY_CARE_PROVIDER_SITE_OTHER): Payer: BC Managed Care – PPO | Admitting: Physician Assistant

## 2013-12-04 ENCOUNTER — Encounter: Payer: Self-pay | Admitting: Physician Assistant

## 2013-12-04 VITALS — BP 100/80 | HR 72 | Temp 98.9°F | Resp 18 | Wt 168.1 lb

## 2013-12-04 DIAGNOSIS — A088 Other specified intestinal infections: Secondary | ICD-10-CM

## 2013-12-04 DIAGNOSIS — A084 Viral intestinal infection, unspecified: Secondary | ICD-10-CM

## 2013-12-04 NOTE — Progress Notes (Signed)
Subjective:    Patient ID: Shelly Bowers, female    DOB: [birth year], 62 y.o.   MRN: [id number]  Diarrhea  This is a new problem. The current episode started yesterday. The problem occurs more than 10 times per day. The problem has been unchanged. The stool consistency is described as watery. The patient states that diarrhea awakens her from sleep. Associated symptoms include abdominal pain (general, cramping, from upper to lower, moving around, relieved with BM.), bloating, chills, a fever (102 F last night, took ibuprofen, which resolved temperature), headaches (dull headache), myalgias and sweats. Pertinent negatives include no arthralgias, coughing, increased  flatus, URI, vomiting or weight loss. Nothing aggravates the symptoms. There are no known risk factors. She has tried increased fluids (ibuprofen, gingerale) for the symptoms. The treatment provided mild relief. There is no history of bowel resection, inflammatory bowel disease, irritable bowel syndrome, malabsorption, a recent abdominal surgery or short gut syndrome.      Review of Systems  Constitutional: Positive for fever (102 F last night, took ibuprofen, which resolved temperature) and chills. Negative for weight loss.  Respiratory: Negative for cough and shortness of breath.   Cardiovascular: Negative for chest pain.  Gastrointestinal: Positive for abdominal pain (general, cramping, from upper to lower, moving around, relieved with BM.), diarrhea and bloating. Negative for nausea, vomiting and flatus.  Musculoskeletal: Positive for myalgias. Negative for arthralgias.  Neurological: Positive for headaches (dull headache). Negative for syncope.  All other systems reviewed and are negative.    History reviewed. No pertinent past medical history.  History   Social History  . Marital Status: Married    Spouse Name: N/A    Number of Children: N/A  . Years of Education: N/A   Occupational History  . Not on file.    Social History Main Topics  . Smoking status: Never Smoker   . Smokeless tobacco: Never Used  . Alcohol Use: Yes     Comment: occ  . Drug Use: No  . Sexual Activity: Not on file   Other Topics Concern  . Not on file   Social History Narrative  . No narrative on file    History reviewed. No pertinent past surgical history.  No family history on file.  Allergies  Allergen Reactions  . Codeine Sulfate     REACTION: unspecified    Current Outpatient Prescriptions on File Prior to Visit  Medication Sig Dispense Refill  . CELEBREX 200 MG capsule Take 200 mg by mouth 2 (two) times daily as needed.       Marland Kitchen ibuprofen (ADVIL,MOTRIN) 200 MG tablet Take 200 mg by mouth every 6 (six) hours as needed.         No current facility-administered medications on file prior to visit.    EXAM: BP 100/80  Pulse 72  Temp(Src) 98.9 F (37.2 C) (Oral)  Resp 18  Wt 168 lb 1.6 oz (76.25 kg)     Objective:   Physical Exam  Nursing note and vitals reviewed. Constitutional: She is oriented to person, place, and time. She appears well-developed and well-nourished. No distress.  HENT:  Head: Normocephalic and atraumatic.  Eyes: Conjunctivae and EOM are normal.  Neck: Normal range of motion.  Cardiovascular: Normal rate, regular rhythm and intact distal pulses.   Pulmonary/Chest: Effort normal and breath sounds normal. No respiratory distress. She has no wheezes. She has no rales. She exhibits no tenderness.  Abdominal: Soft. Bowel sounds are normal. She exhibits no distension and no mass. There  is no tenderness. There is no rebound and no guarding.  Musculoskeletal: She exhibits no edema.  Neurological: She is alert and oriented to person, place, and time.  Skin: Skin is warm and dry. She is not diaphoretic. No pallor.  Psychiatric: She has a normal mood and affect. Her behavior is normal. Judgment and thought content normal.     Lab Results  Component Value Date   WBC 4.6 04/14/2013    HGB 13.7 04/14/2013   HCT 40.7 04/14/2013   PLT 172.0 04/14/2013   GLUCOSE 90 04/14/2013   CHOL 265* 04/14/2013   TRIG 130.0 04/14/2013   HDL 57.90 04/14/2013   LDLDIRECT 191.9 04/14/2013   ALT 28 04/14/2013   AST 30 04/14/2013   NA 140 04/14/2013   K 5.5* 04/14/2013   CL 102 04/14/2013   CREATININE 0.7 04/14/2013   BUN 13 04/14/2013   CO2 29 04/14/2013   TSH 1.51 04/14/2013        Assessment & Plan:  Shelly Bowers was seen today for diarrhea.  Diagnoses and associated orders for this visit:  Viral enteritis Comments: BRAT diet, Push fluids, electrolytes, okay to try immodium. Rest. Watchful waiting.    Return precautions provided, and patient handout on diarrhea.  Plan to follow up as needed, or for worsening or persistent symptoms despite treatment.  Patient Instructions  Continue to use Tylenol or ibuprofen to help your fever symptoms.  You can try using over-the-counter Imodium for a few days to help your diarrhea symptoms.  Make sure that you pushing fluid hydration with water and also occasionally makes an electrolyte solution.  Try to maintain a bland diet such as the brat diet.  If emergency symptoms discussed during visit developed, seek medical attention immediately.  Followup as needed, or for worsening or persistent symptoms despite treatment.

## 2013-12-04 NOTE — Patient Instructions (Addendum)
Continue to use Tylenol or ibuprofen to help your fever symptoms.  You can try using over-the-counter Imodium for a few days to help your diarrhea symptoms.  Make sure that you pushing fluid hydration with water and also occasionally makes an electrolyte solution.  Try to maintain a bland diet such as the brat diet.  If emergency symptoms discussed during visit developed, seek medical attention immediately.  Followup as needed, or for worsening or persistent symptoms despite treatment.    Diarrhea Diarrhea is watery poop (stool). It can make you feel weak, tired, thirsty, or give you a dry mouth (signs of dehydration). Watery poop is a sign of another problem, most often an infection. It often lasts 2-3 days. It can last longer if it is a sign of something serious. Take care of yourself as told by your doctor. HOME CARE   Drink 1 cup (8 ounces) of fluid each time you have watery poop.  Do not drink the following fluids:  Those that contain simple sugars (fructose, glucose, galactose, lactose, sucrose, maltose).  Sports drinks.  Fruit juices.  Whole milk products.  Sodas.  Drinks with caffeine (coffee, tea, soda) or alcohol.  Oral rehydration solution may be used if the doctor says it is okay. You may make your own solution. Follow this recipe:   - teaspoon table salt.   teaspoon baking soda.   teaspoon salt substitute containing potassium chloride.  1 tablespoons sugar.  1 liter (34 ounces) of water.  Avoid the following foods:  High fiber foods, such as raw fruits and vegetables.  Nuts, seeds, and whole grain breads and cereals.   Those that are sweetened with sugar alcohols (xylitol, sorbitol, mannitol).  Try eating the following foods:  Starchy foods, such as rice, toast, pasta, low-sugar cereal, oatmeal, baked potatoes, crackers, and bagels.  Bananas.  Applesauce.  Eat probiotic-rich foods, such as yogurt and milk products that are fermented.  Wash  your hands well after each time you have watery poop.  Only take medicine as told by your doctor.  Take a warm bath to help lessen burning or pain from having watery poop. GET HELP RIGHT AWAY IF:   You cannot drink fluids without throwing up (vomiting).  You keep throwing up.  You have blood in your poop, or your poop looks black and tarry.  You do not pee (urinate) in 6-8 hours, or there is only a small amount of very dark pee.  You have belly (abdominal) pain that gets worse or stays in the same spot (localizes).  You are weak, dizzy, confused, or light-headed.  You have a very bad headache.  Your watery poop gets worse or does not get better.  You have a fever or lasting symptoms for more than 2-3 days.  You have a fever and your symptoms suddenly get worse. MAKE SURE YOU:   Understand these instructions.  Will watch your condition.  Will get help right away if you are not doing well or get worse. Document Released: 08/29/2007 Document Revised: 07/27/2013 Document Reviewed: 11/18/2011 [facility] Patient Information 2015 [place], [place]. This information is not intended to replace advice given to you by your health care provider. Make sure you discuss any questions you have with your health care provider.

## 2014-01-27 ENCOUNTER — Ambulatory Visit (INDEPENDENT_AMBULATORY_CARE_PROVIDER_SITE_OTHER): Payer: BC Managed Care – PPO | Admitting: *Deleted

## 2014-01-27 ENCOUNTER — Ambulatory Visit: Payer: BC Managed Care – PPO | Admitting: Internal Medicine

## 2014-01-27 DIAGNOSIS — Z23 Encounter for immunization: Secondary | ICD-10-CM

## 2014-04-28 ENCOUNTER — Other Ambulatory Visit: Payer: BC Managed Care – PPO

## 2014-04-29 ENCOUNTER — Other Ambulatory Visit (INDEPENDENT_AMBULATORY_CARE_PROVIDER_SITE_OTHER): Payer: BLUE CROSS/BLUE SHIELD

## 2014-04-29 DIAGNOSIS — Z0000 Encounter for general adult medical examination without abnormal findings: Secondary | ICD-10-CM

## 2014-04-29 LAB — BASIC METABOLIC PANEL
BUN: 11 mg/dL (ref 6–23)
CO2: 28 mEq/L (ref 19–32)
Calcium: 9.5 mg/dL (ref 8.4–10.5)
Chloride: 105 mEq/L (ref 96–112)
Creatinine, Ser: 0.69 mg/dL (ref 0.40–1.20)
GFR: 90.45 mL/min (ref >60.00)
Glucose, Bld: 86 mg/dL (ref 70–99)
Potassium: 4.2 mEq/L (ref 3.5–5.1)
Sodium: 140 mEq/L (ref 135–145)

## 2014-04-29 LAB — POCT URINALYSIS DIP (MANUAL ENTRY)
Bilirubin, UA: NEGATIVE
Blood, UA: NEGATIVE
Glucose, UA: NEGATIVE
Ketones, POC UA: NEGATIVE
Nitrite, UA: NEGATIVE
Protein Ur, POC: NEGATIVE
Spec Grav, UA: 1.015
Urobilinogen, UA: 0.2
pH, UA: 6

## 2014-04-29 LAB — LIPID PANEL
Cholesterol: 247 mg/dL — ABNORMAL HIGH (ref 0–200)
HDL: 61.2 mg/dL (ref >39.00)
LDL Cholesterol: 159 mg/dL — ABNORMAL HIGH (ref 0–99)
NonHDL: 185.8
Total CHOL/HDL Ratio: 4
Triglycerides: 133 mg/dL (ref 0.0–149.0)
VLDL: 26.6 mg/dL (ref 0.0–40.0)

## 2014-04-29 LAB — CBC WITH DIFFERENTIAL/PLATELET
Basophils Absolute: 0 10*3/uL (ref 0.0–0.1)
Basophils Relative: 0.4 % (ref 0.0–3.0)
Eosinophils Absolute: 0.1 10*3/uL (ref 0.0–0.7)
Eosinophils Relative: 2.5 % (ref 0.0–5.0)
HCT: 37.3 % (ref 36.0–46.0)
Hemoglobin: 12.7 g/dL (ref 12.0–15.0)
Lymphocytes Relative: 31 % (ref 12.0–46.0)
Lymphs Abs: 1.5 10*3/uL (ref 0.7–4.0)
MCHC: 33.9 g/dL (ref 30.0–36.0)
MCV: 98.8 fl (ref 78.0–100.0)
Monocytes Absolute: 0.5 10*3/uL (ref 0.1–1.0)
Monocytes Relative: 10.7 % (ref 3.0–12.0)
Neutro Abs: 2.7 10*3/uL (ref 1.4–7.7)
Neutrophils Relative %: 55.4 % (ref 43.0–77.0)
Platelets: 163 10*3/uL (ref 150.0–400.0)
RBC: 3.78 Mil/uL — ABNORMAL LOW (ref 3.87–5.11)
RDW: 13.6 % (ref 11.5–15.5)
WBC: 4.8 10*3/uL (ref 4.0–10.5)

## 2014-04-29 LAB — HEPATIC FUNCTION PANEL
ALT: 14 U/L (ref 0–35)
AST: 17 U/L (ref 0–37)
Albumin: 4.2 g/dL (ref 3.5–5.2)
Alkaline Phosphatase: 68 U/L (ref 39–117)
Bilirubin, Direct: 0.1 mg/dL (ref 0.0–0.3)
Total Bilirubin: 0.8 mg/dL (ref 0.2–1.2)
Total Protein: 6.7 g/dL (ref 6.0–8.3)

## 2014-04-29 LAB — TSH: TSH: 1.63 u[IU]/mL (ref 0.35–4.50)

## 2014-05-07 ENCOUNTER — Encounter: Payer: Medicare Other | Admitting: Internal Medicine

## 2014-06-15 ENCOUNTER — Ambulatory Visit (INDEPENDENT_AMBULATORY_CARE_PROVIDER_SITE_OTHER): Payer: BLUE CROSS/BLUE SHIELD | Admitting: Internal Medicine

## 2014-06-15 ENCOUNTER — Encounter: Payer: Self-pay | Admitting: Internal Medicine

## 2014-06-15 VITALS — BP 150/86 | HR 66 | Temp 98.4°F | Resp 18 | Ht 64.5 in | Wt 172.0 lb

## 2014-06-15 DIAGNOSIS — E785 Hyperlipidemia, unspecified: Secondary | ICD-10-CM

## 2014-06-15 DIAGNOSIS — Z0000 Encounter for general adult medical examination without abnormal findings: Secondary | ICD-10-CM

## 2014-06-15 NOTE — Patient Instructions (Signed)
Limit your sodium (Salt) intake    It is important that you exercise regularly, at least 20 minutes 3 to 4 times per week.  If you develop chest pain or shortness of breath seek  medical attention.  Return in one year for follow-up   Please check your blood pressure on a regular basis.  If it is consistently greater than 150/90, please make an office appointment.   

## 2014-06-15 NOTE — Progress Notes (Signed)
Subjective:    Patient ID: Shelly Bowers, female    DOB: 09-Dec-1950, 64 y.o.   MRN: [id number]  HPI   Subjective:    Patient ID: Shelly Bowers, female    DOB: 04-03-1950, 64 y.o.   MRN: [id number]  HPI 80 -year-old patient who is seen today for a preventive health examination. She is followed annually by Dr. Stann Mainland and also by orthopedic surgery. Her main complaint today is bilateral foot pain.  She does have a history of osteoarthritis. She remains on Celebrex daily.  Colonoscopy Jul 13, 2005  Family history father died at age 68 history of COPD and atrial fibrillation Mother age 65 with senile dementia one brother age 21 with atrial fibrillation  No past medical history on file.  History   Social History  . Marital Status: Married    Spouse Name: N/A  . Number of Children: N/A  . Years of Education: N/A   Occupational History  . Not on file.   Social History Main Topics  . Smoking status: Never Smoker   . Smokeless tobacco: Never Used  . Alcohol Use: Yes     Comment: occ  . Drug Use: No  . Sexual Activity: Not on file   Other Topics Concern  . Not on file   Social History Narrative    No past surgical history on file.  No family history on file.  Allergies  Allergen Reactions  . Codeine Sulfate     REACTION: unspecified    Current Outpatient Prescriptions on File Prior to Visit  Medication Sig Dispense Refill  . CELEBREX 200 MG capsule Take 200 mg by mouth 2 (two) times daily as needed.     Marland Kitchen ibuprofen (ADVIL,MOTRIN) 200 MG tablet Take 200 mg by mouth every 6 (six) hours as needed.       No current facility-administered medications on file prior to visit.    BP 150/86 mmHg  Pulse 66  Temp(Src) 98.4 F (36.9 C) (Oral)  Resp 18  Ht 5' 4.5" (1.638 m)  Wt 172 lb (78.019 kg)  BMI 29.08 kg/m2  SpO2 98%  1. Risk factors, based on past  M,S,F history- no cardiovascular risk factors except for her dyslipidemia  2.  Physical activities:  Limited by  arthritis especially left hip pain  3.  Depression/mood: No history depression and mood disorder  4.  Hearing: No deficits  5.  ADL's: Independent in all aspects of daily living  6.  Fall risk: Increase to 2 left hip pain  7.  Home safety: No problems identified  8.  Height weight, and visual acuity; height and weight stable no change in visual acuity 9.  Counseling: Heart healthy diet and regular exercise regimen encouraged  10. Lab orders based on risk factors: Laboratory profile reviewed. Dyslipidemia and possible treatment discussed at length  11. Referral : Followup orthopedics an annual gynecologic examinations  12. Care plan: Continue heart healthy diet and efforts at weight loss  13. Cognitive assessment: Alert and oriented with normal affect. No cognitive dysfunction  14.  Preventive services will include annual health assessment and also annual gynecologic evaluation. Annual  Eye examination encouraged..   Patient was provided with a written and personalized care plan  15.  Provider list includes primary care ophthalmology gynecology and orthopedic surgery      Review of Systems  Constitutional: Negative.  Negative for fever, appetite change, fatigue and unexpected weight change.  HENT: Negative for congestion, dental problem, ear pain, hearing loss, mouth  sores, nosebleeds, rhinorrhea, sinus pressure, sore throat, tinnitus, trouble swallowing and voice change.   Eyes: Negative for photophobia, pain, discharge, redness and visual disturbance.  Respiratory: Negative for cough, chest tightness and shortness of breath.   Cardiovascular: Negative for chest pain, palpitations and leg swelling.  Gastrointestinal: Negative for nausea, vomiting, abdominal pain, diarrhea, constipation, blood in stool, abdominal distention and rectal pain.  Genitourinary: Negative for dysuria, urgency, frequency, hematuria, flank pain, vaginal bleeding, vaginal discharge, difficulty urinating,  genital sores, vaginal pain, menstrual problem and pelvic pain.  Musculoskeletal: Positive for arthralgias, gait problem and joint swelling. Negative for back pain and neck stiffness.  Skin: Negative for rash.  Neurological: Negative for dizziness, syncope, speech difficulty, weakness, light-headedness, numbness and headaches.  Hematological: Negative for adenopathy. Does not bruise/bleed easily.  Psychiatric/Behavioral: Negative for suicidal ideas, behavioral problems, self-injury, dysphoric mood and agitation. The patient is not nervous/anxious.        Objective:   Physical Exam  Constitutional: She is oriented to person, place, and time. She appears well-developed and well-nourished.  HENT:  Head: Normocephalic and atraumatic.  Right Ear: External ear normal.  Left Ear: External ear normal.  Mouth/Throat: Oropharynx is clear and moist.  Eyes: Conjunctivae and EOM are normal. Pupils are equal, round, and reactive to light.  Neck: Normal range of motion. Neck supple. No JVD present. No thyromegaly present.  Cardiovascular: Normal rate, regular rhythm, normal heart sounds and intact distal pulses.   No murmur heard. Pulmonary/Chest: Effort normal and breath sounds normal. She has no wheezes. She has no rales.  Abdominal: Soft. Bowel sounds are normal. She exhibits no distension and no mass. There is no tenderness. There is no rebound and no guarding.  Musculoskeletal: Normal range of motion. She exhibits no edema and no tenderness.  Lymphadenopathy:    She has no cervical adenopathy.  Neurological: She is alert and oriented to person, place, and time. She has normal reflexes. No cranial nerve deficit. She exhibits normal muscle tone. Coordination normal.  Skin: Skin is warm and dry. No rash noted.  Psychiatric: She has a normal mood and affect. Her behavior is normal.          Assessment & Plan:  Health maintenance exam We'll continue gynecologic followup Celebrex  refilled Return here in one year or when necessary Dyslipidemia.  Lipid profile has improved over the years   Review of Systems    as above Objective:   Physical Exam  As above      Assessment & Plan:  As above

## 2014-07-02 ENCOUNTER — Other Ambulatory Visit: Payer: Self-pay

## 2014-07-02 DIAGNOSIS — Z1231 Encounter for screening mammogram for malignant neoplasm of breast: Secondary | ICD-10-CM

## 2014-07-07 ENCOUNTER — Ambulatory Visit (INDEPENDENT_AMBULATORY_CARE_PROVIDER_SITE_OTHER): Payer: BLUE CROSS/BLUE SHIELD | Admitting: *Deleted

## 2014-07-07 DIAGNOSIS — Z23 Encounter for immunization: Secondary | ICD-10-CM

## 2014-08-03 ENCOUNTER — Ambulatory Visit
Admission: RE | Admit: 2014-08-03 | Discharge: 2014-08-03 | Disposition: A | Discharge status: Home / Self Care | Payer: BLUE CROSS/BLUE SHIELD | Source: Ambulatory Visit

## 2014-08-03 DIAGNOSIS — Z1231 Encounter for screening mammogram for malignant neoplasm of breast: Secondary | ICD-10-CM

## 2014-09-20 ENCOUNTER — Ambulatory Visit: Payer: BLUE CROSS/BLUE SHIELD | Admitting: Podiatry

## 2014-10-05 ENCOUNTER — Encounter: Payer: Self-pay | Admitting: Podiatry

## 2014-10-05 ENCOUNTER — Ambulatory Visit (INDEPENDENT_AMBULATORY_CARE_PROVIDER_SITE_OTHER): Payer: Medicare HMO | Admitting: Podiatry

## 2014-10-05 ENCOUNTER — Ambulatory Visit (INDEPENDENT_AMBULATORY_CARE_PROVIDER_SITE_OTHER): Payer: Medicare HMO

## 2014-10-05 VITALS — BP 146/85 | HR 55 | Resp 12

## 2014-10-05 DIAGNOSIS — R52 Pain, unspecified: Secondary | ICD-10-CM

## 2014-10-05 DIAGNOSIS — M779 Enthesopathy, unspecified: Secondary | ICD-10-CM | POA: Diagnosis not present

## 2014-10-05 DIAGNOSIS — R609 Edema, unspecified: Secondary | ICD-10-CM

## 2014-10-05 MED ORDER — TRIAMCINOLONE ACETONIDE 10 MG/ML IJ SUSP
10.0000 mg | Freq: Once | INTRAMUSCULAR | Status: AC
  Administered 2014-10-05: 10 mg

## 2014-10-05 NOTE — Progress Notes (Signed)
   Subjective:    Patient ID: Shelly Bowers, female    DOB: 23-Jun-1950, 64 y.o.   MRN: [id number]  HPI Patient has swelling and inflammation in L foot which she has had for 5 months. Pain is primarily on the top of foot/ankle and it hurts patient when she bends her foot.   Review of Systems  Skin: Positive for color change.  All other systems reviewed and are negative.      Objective:   Physical Exam        Assessment & Plan:

## 2014-10-05 NOTE — Progress Notes (Signed)
Subjective:     Patient ID: Shelly Bowers, female   DOB: 11-02-1950, 64 y.o.   MRN: [id number]  HPI patient presents stating she's been having some pain in the outside of her left ankle for about 5 months and does not remember specific injury. Patient states that this is been going on and getting gradually worse   Review of Systems  All other systems reviewed and are negative.      Objective:   Physical Exam  Constitutional: She is oriented to person, place, and time.  Cardiovascular: Intact distal pulses.   Musculoskeletal: Normal range of motion.  Neurological: She is oriented to person, place, and time.  Skin: Skin is warm.  Nursing note and vitals reviewed.  Neurovascular status intact muscle strength adequate range of motion within normal limits with patient noted to have pain in the left lateral foot around the peroneal brevis insertion base of fifth metatarsal and slightly dorsal to this area with +1 pitting edema noted    Assessment:     Neurovascular status intact muscle strength adequate with range of motion subtalar midtarsal joint within normal limits. Patient's noted to have inflammation in the lateral side of the left foot around the base of the fifth metatarsal with +1 pitting edema surrounding this area    Plan:     H&P and x-rays reviewed with patient. Injected the lateral complex and base of peroneal 3 mg Kenalog 5 mill grams Xylocaine and dispensed a compressive stocking for reducing the edema

## 2015-02-25 ENCOUNTER — Ambulatory Visit (INDEPENDENT_AMBULATORY_CARE_PROVIDER_SITE_OTHER): Payer: Medicare HMO

## 2015-02-25 DIAGNOSIS — Z23 Encounter for immunization: Secondary | ICD-10-CM

## 2015-04-18 ENCOUNTER — Other Ambulatory Visit: Payer: Self-pay | Admitting: Physical Medicine and Rehabilitation

## 2015-04-18 DIAGNOSIS — M545 Low back pain: Secondary | ICD-10-CM

## 2015-04-23 ENCOUNTER — Inpatient Hospital Stay: Admission: RE | Admit: 2015-04-23 | Payer: Medicare HMO | Source: Ambulatory Visit

## 2015-04-28 ENCOUNTER — Ambulatory Visit
Admission: RE | Admit: 2015-04-28 | Discharge: 2015-04-28 | Disposition: A | Discharge status: Home / Self Care | Payer: Medicare HMO | Source: Ambulatory Visit | Attending: Physical Medicine and Rehabilitation | Admitting: Physical Medicine and Rehabilitation

## 2015-04-28 DIAGNOSIS — M545 Low back pain: Secondary | ICD-10-CM

## 2015-04-29 ENCOUNTER — Other Ambulatory Visit: Payer: Medicare HMO

## 2015-06-10 ENCOUNTER — Other Ambulatory Visit: Payer: Self-pay

## 2015-06-10 ENCOUNTER — Other Ambulatory Visit (INDEPENDENT_AMBULATORY_CARE_PROVIDER_SITE_OTHER): Payer: Medicare HMO

## 2015-06-10 DIAGNOSIS — Z1231 Encounter for screening mammogram for malignant neoplasm of breast: Secondary | ICD-10-CM

## 2015-06-10 DIAGNOSIS — Z0000 Encounter for general adult medical examination without abnormal findings: Secondary | ICD-10-CM | POA: Diagnosis not present

## 2015-06-10 LAB — POC URINALSYSI DIPSTICK (AUTOMATED)
Bilirubin, UA: NEGATIVE
Blood, UA: NEGATIVE
Glucose, UA: NEGATIVE
Ketones, UA: NEGATIVE
Leukocytes, UA: NEGATIVE
Nitrite, UA: NEGATIVE
Protein, UA: NEGATIVE
Spec Grav, UA: 1.015
Urobilinogen, UA: 0.2
pH, UA: 6.5

## 2015-06-10 LAB — HEPATIC FUNCTION PANEL
ALT: 20 U/L (ref 0–35)
AST: 22 U/L (ref 0–37)
Albumin: 4.3 g/dL (ref 3.5–5.2)
Alkaline Phosphatase: 78 U/L (ref 39–117)
Bilirubin, Direct: 0.2 mg/dL (ref 0.0–0.3)
Total Bilirubin: 0.7 mg/dL (ref 0.2–1.2)
Total Protein: 6.8 g/dL (ref 6.0–8.3)

## 2015-06-10 LAB — LIPID PANEL
Cholesterol: 281 mg/dL — ABNORMAL HIGH (ref 0–200)
HDL: 69.5 mg/dL (ref >39.00)
LDL Cholesterol: 180 mg/dL — ABNORMAL HIGH (ref 0–99)
NonHDL: 211.82
Total CHOL/HDL Ratio: 4
Triglycerides: 159 mg/dL — ABNORMAL HIGH (ref 0.0–149.0)
VLDL: 31.8 mg/dL (ref 0.0–40.0)

## 2015-06-10 LAB — CBC WITH DIFFERENTIAL/PLATELET
Basophils Absolute: 0 10*3/uL (ref 0.0–0.1)
Basophils Relative: 0.6 % (ref 0.0–3.0)
Eosinophils Absolute: 0.1 10*3/uL (ref 0.0–0.7)
Eosinophils Relative: 2.2 % (ref 0.0–5.0)
HCT: 38.2 % (ref 36.0–46.0)
Hemoglobin: 12.9 g/dL (ref 12.0–15.0)
Lymphocytes Relative: 35.9 % (ref 12.0–46.0)
Lymphs Abs: 1.5 10*3/uL (ref 0.7–4.0)
MCHC: 33.9 g/dL (ref 30.0–36.0)
MCV: 100.7 fl — ABNORMAL HIGH (ref 78.0–100.0)
Monocytes Absolute: 0.5 10*3/uL (ref 0.1–1.0)
Monocytes Relative: 10.8 % (ref 3.0–12.0)
Neutro Abs: 2.1 10*3/uL (ref 1.4–7.7)
Neutrophils Relative %: 50.5 % (ref 43.0–77.0)
Platelets: 195 10*3/uL (ref 150.0–400.0)
RBC: 3.79 Mil/uL — ABNORMAL LOW (ref 3.87–5.11)
RDW: 13.9 % (ref 11.5–15.5)
WBC: 4.2 10*3/uL (ref 4.0–10.5)

## 2015-06-10 LAB — BASIC METABOLIC PANEL
BUN: 10 mg/dL (ref 6–23)
CO2: 26 mEq/L (ref 19–32)
Calcium: 9.6 mg/dL (ref 8.4–10.5)
Chloride: 102 mEq/L (ref 96–112)
Creatinine, Ser: 0.74 mg/dL (ref 0.40–1.20)
GFR: 83.16 mL/min (ref >60.00)
Glucose, Bld: 87 mg/dL (ref 70–99)
Potassium: 4.1 mEq/L (ref 3.5–5.1)
Sodium: 139 mEq/L (ref 135–145)

## 2015-06-10 LAB — TSH: TSH: 2.7 u[IU]/mL (ref 0.35–4.50)

## 2015-06-17 ENCOUNTER — Ambulatory Visit (INDEPENDENT_AMBULATORY_CARE_PROVIDER_SITE_OTHER): Payer: Medicare HMO | Admitting: Internal Medicine

## 2015-06-17 ENCOUNTER — Encounter: Payer: Self-pay | Admitting: Internal Medicine

## 2015-06-17 VITALS — BP 150/90 | HR 64 | Temp 98.1°F | Resp 20 | Ht 64.5 in | Wt 182.0 lb

## 2015-06-17 DIAGNOSIS — Z0000 Encounter for general adult medical examination without abnormal findings: Secondary | ICD-10-CM

## 2015-06-17 DIAGNOSIS — E785 Hyperlipidemia, unspecified: Secondary | ICD-10-CM | POA: Diagnosis not present

## 2015-06-17 NOTE — Progress Notes (Signed)
Pre visit review using our clinic review tool, if applicable. No additional management support is needed unless otherwise documented below in the visit note. 

## 2015-06-17 NOTE — Patient Instructions (Signed)
It is important that you exercise regularly, at least 20 minutes 3 to 4 times per week.  If you develop chest pain or shortness of breath seek  medical attention.  Limit your sodium (Salt) intake  Return in one year for follow-up  Fat and Cholesterol Restricted Diet High levels of fat and cholesterol in your blood may lead to various health problems, such as diseases of the heart, blood vessels, gallbladder, liver, and pancreas. Fats are concentrated sources of energy that come in various forms. Certain types of fat, including saturated fat, may be harmful in excess. Cholesterol is a substance needed by your body in small amounts. Your body makes all the cholesterol it needs. Excess cholesterol comes from the food you eat. When you have high levels of cholesterol and saturated fat in your blood, health problems can develop because the excess fat and cholesterol will gather along the walls of your blood vessels, causing them to narrow. Choosing the right foods will help you control your intake of fat and cholesterol. This will help keep the levels of these substances in your blood within normal limits and reduce your risk of disease. WHAT IS MY PLAN? Your health care provider recommends that you:  Get no more than __________ % of the total calories in your daily diet from fat.  Limit your intake of saturated fat to less than ______% of your total calories each day.  Limit the amount of cholesterol in your diet to less than _________mg per day. WHAT TYPES OF FAT SHOULD I CHOOSE?  Choose healthy fats more often. Choose monounsaturated and polyunsaturated fats, such as olive and canola oil, flaxseeds, walnuts, almonds, and seeds.  Eat more omega-3 fats. Good choices include salmon, mackerel, sardines, tuna, flaxseed oil, and ground flaxseeds. Aim to eat fish at least two times a week.  Limit saturated fats. Saturated fats are primarily found in animal products, such as meats, butter, and cream.  Plant sources of saturated fats include palm oil, palm kernel oil, and coconut oil.  Avoid foods with partially hydrogenated oils in them. These contain trans fats. Examples of foods that contain trans fats are stick margarine, some tub margarines, cookies, crackers, and other baked goods. WHAT GENERAL GUIDELINES DO I NEED TO FOLLOW? These guidelines for healthy eating will help you control your intake of fat and cholesterol:  Check food labels carefully to identify foods with trans fats or high amounts of saturated fat.  Fill one half of your plate with vegetables and green salads.  Fill one fourth of your plate with whole grains. Look for the word "whole" as the first word in the ingredient list.  Fill one fourth of your plate with lean protein foods.  Limit fruit to two servings a day. Choose fruit instead of juice.  Eat more foods that contain soluble fiber. Examples of foods that contain this type of fiber are apples, broccoli, carrots, beans, peas, and barley. Aim to get 20-30 g of fiber per day.  Eat more home-cooked food and less restaurant, buffet, and fast food.  Limit or avoid alcohol.  Limit foods high in starch and sugar.  Limit fried foods.  Cook foods using methods other than frying. Baking, boiling, grilling, and broiling are all great options.  Lose weight if you are overweight. Losing just 5-10% of your initial body weight can help your overall health and prevent diseases such as diabetes and heart disease. WHAT FOODS CAN I EAT? Grains Whole grains, such as whole wheat or  whole grain breads, crackers, cereals, and pasta. Unsweetened oatmeal, bulgur, barley, quinoa, or brown rice. Corn or whole wheat flour tortillas. Vegetables Fresh or frozen vegetables (raw, steamed, roasted, or grilled). Green salads. Fruits All fresh, canned (in natural juice), or frozen fruits. Meat and Other Protein Products Ground beef (85% or leaner), grass-fed beef, or beef trimmed of  fat. Skinless chicken or [place]. Ground chicken or [place]. Pork trimmed of fat. All fish and seafood. Eggs. Dried beans, peas, or lentils. Unsalted nuts or seeds. Unsalted canned or dry beans. Dairy Low-fat dairy products, such as skim or 1% milk, 2% or reduced-fat cheeses, low-fat ricotta or cottage cheese, or plain low-fat yogurt. Fats and Oils Tub margarines without trans fats. Light or reduced-fat mayonnaise and salad dressings. Avocado. Olive, canola, sesame, or safflower oils. Natural peanut or almond butter (choose ones without added sugar and oil). The items listed above may not be a complete list of recommended foods or beverages. Contact your dietitian for more options. WHAT FOODS ARE NOT RECOMMENDED? Grains White bread. White pasta. White rice. Cornbread. Bagels, pastries, and croissants. Crackers that contain trans fat. Vegetables White potatoes. Corn. Creamed or fried vegetables. Vegetables in a cheese sauce. Fruits Dried fruits. Canned fruit in light or heavy syrup. Fruit juice. Meat and Other Protein Products Fatty cuts of meat. Ribs, chicken wings, bacon, sausage, bologna, salami, chitterlings, fatback, hot dogs, bratwurst, and packaged luncheon meats. Liver and organ meats. Dairy Whole or 2% milk, cream, half-and-half, and cream cheese. Whole milk cheeses. Whole-fat or sweetened yogurt. Full-fat cheeses. Nondairy creamers and whipped toppings. Processed cheese, cheese spreads, or cheese curds. Sweets and Desserts Corn syrup, sugars, honey, and molasses. Candy. Jam and jelly. Syrup. Sweetened cereals. Cookies, pies, cakes, donuts, muffins, and ice cream. Fats and Oils Butter, stick margarine, lard, shortening, ghee, or bacon fat. Coconut, palm kernel, or palm oils. Beverages Alcohol. Sweetened drinks (such as sodas, lemonade, and fruit drinks or punches). The items listed above may not be a complete list of foods and beverages to avoid. Contact your dietitian for more  information.   This information is not intended to replace advice given to you by your health care provider. Make sure you discuss any questions you have with your health care provider.   Document Released: 03/12/2005 Document Revised: 04/02/2014 Document Reviewed: 06/10/2013 Elsevier Interactive Patient Education [employer]. Menopause is a normal process in which your reproductive ability comes to an end. This process happens gradually over a span of months to years, usually between the ages of 70 and 7. Menopause is complete when you have missed 12 consecutive menstrual periods. It is important to talk with your health care provider about some of the most common conditions that affect postmenopausal women, such as heart disease, cancer, and bone loss (osteoporosis). Adopting a healthy lifestyle and getting preventive care can help to promote your health and wellness. Those actions can also lower your chances of developing some of these common conditions. WHAT SHOULD I KNOW ABOUT MENOPAUSE? During menopause, you may experience a number of symptoms, such as:  Moderate-to-severe hot flashes.  Night sweats.  Decrease in sex drive.  Mood swings.  Headaches.  Tiredness.  Irritability.  Memory problems.  Insomnia. Choosing to treat or not to treat menopausal changes is an individual decision that you make with your health care provider. WHAT SHOULD I KNOW ABOUT HORMONE REPLACEMENT THERAPY AND SUPPLEMENTS? Hormone therapy products are effective for treating symptoms that are associated with menopause, such as hot flashes and  night sweats. Hormone replacement carries certain risks, especially as you become older. If you are thinking about using estrogen or estrogen with progestin treatments, discuss the benefits and risks with your health care provider. WHAT SHOULD I KNOW ABOUT HEART DISEASE AND STROKE? Heart disease, heart attack, and stroke become more likely as you age. This may  be due, in part, to the hormonal changes that your body experiences during menopause. These can affect how your body processes dietary fats, triglycerides, and cholesterol. Heart attack and stroke are both medical emergencies. There are many things that you can do to help prevent heart disease and stroke:  Have your blood pressure checked at least every 1-2 years. High blood pressure causes heart disease and increases the risk of stroke.  If you are 53-32 years old, ask your health care provider if you should take aspirin to prevent a heart attack or a stroke.  Do not use any tobacco products, including cigarettes, chewing tobacco, or electronic cigarettes. If you need help quitting, ask your health care provider.  It is important to eat a healthy diet and maintain a healthy weight.  Be sure to include plenty of vegetables, fruits, low-fat dairy products, and lean protein.  Avoid eating foods that are high in solid fats, added sugars, or salt (sodium).  Get regular exercise. This is one of the most important things that you can do for your health.  Try to exercise for at least 150 minutes each week. The type of exercise that you do should increase your heart rate and make you sweat. This is known as moderate-intensity exercise.  Try to do strengthening exercises at least twice each week. Do these in addition to the moderate-intensity exercise.  Know your numbers.Ask your health care provider to check your cholesterol and your blood glucose. Continue to have your blood tested as directed by your health care provider. WHAT SHOULD I KNOW ABOUT CANCER SCREENING? There are several types of cancer. Take the following steps to reduce your risk and to catch any cancer development as early as possible. Breast Cancer  Practice breast self-awareness.  This means understanding how your breasts normally appear and feel.  It also means doing regular breast self-exams. Let your health care provider  know about any changes, no matter how small.  If you are 14 or older, have a clinician do a breast exam (clinical breast exam or CBE) every year. Depending on your age, family history, and medical history, it may be recommended that you also have a yearly breast X-ray (mammogram).  If you have a family history of breast cancer, talk with your health care provider about genetic screening.  If you are at high risk for breast cancer, talk with your health care provider about having an MRI and a mammogram every year.  Breast cancer (BRCA) gene test is recommended for women who have family members with BRCA-related cancers. Results of the assessment will determine the need for genetic counseling and BRCA1 and for BRCA2 testing. BRCA-related cancers include these types:  Breast. This occurs in males or females.  Ovarian.  Tubal. This may also be called fallopian tube cancer.  Cancer of the abdominal or pelvic lining (peritoneal cancer).  Prostate.  Pancreatic. Cervical, Uterine, and Ovarian Cancer Your health care provider may recommend that you be screened regularly for cancer of the pelvic organs. These include your ovaries, uterus, and vagina. This screening involves a pelvic exam, which includes checking for microscopic changes to the surface of your cervix (  Pap test).  For women ages 21-65, health care providers may recommend a pelvic exam and a Pap test every three years. For women ages 17-65, they may recommend the Pap test and pelvic exam, combined with testing for human papilloma virus (HPV), every five years. Some types of HPV increase your risk of cervical cancer. Testing for HPV may also be done on women of any age who have unclear Pap test results.  Other health care providers may not recommend any screening for nonpregnant women who are considered low risk for pelvic cancer and have no symptoms. Ask your health care provider if a screening pelvic exam is right for you.  If you  have had past treatment for cervical cancer or a condition that could lead to cancer, you need Pap tests and screening for cancer for at least 20 years after your treatment. If Pap tests have been discontinued for you, your risk factors (such as having a new sexual partner) need to be reassessed to determine if you should start having screenings again. Some women have medical problems that increase the chance of getting cervical cancer. In these cases, your health care provider may recommend that you have screening and Pap tests more often.  If you have a family history of uterine cancer or ovarian cancer, talk with your health care provider about genetic screening.  If you have vaginal bleeding after reaching menopause, tell your health care provider.  There are currently no reliable tests available to screen for ovarian cancer. Lung Cancer Lung cancer screening is recommended for adults 42-62 years old who are at high risk for lung cancer because of a history of smoking. A yearly low-dose CT scan of the lungs is recommended if you:  Currently smoke.  Have a history of at least 30 pack-years of smoking and you currently smoke or have quit within the past 15 years. A pack-year is smoking an average of one pack of cigarettes per day for one year. Yearly screening should:  Continue until it has been 15 years since you quit.  Stop if you develop a health problem that would prevent you from having lung cancer treatment. Colorectal Cancer  This type of cancer can be detected and can often be prevented.  Routine colorectal cancer screening usually begins at age 27 and continues through age 22.  If you have risk factors for colon cancer, your health care provider may recommend that you be screened at an earlier age.  If you have a family history of colorectal cancer, talk with your health care provider about genetic screening.  Your health care provider may also recommend using home test kits to  check for hidden blood in your stool.  A small camera at the end of a tube can be used to examine your colon directly (sigmoidoscopy or colonoscopy). This is done to check for the earliest forms of colorectal cancer.  Direct examination of the colon should be repeated every 5-10 years until age 49. However, if early forms of precancerous polyps or small growths are found or if you have a family history or genetic risk for colorectal cancer, you may need to be screened more often. Skin Cancer  Check your skin from head to toe regularly.  Monitor any moles. Be sure to tell your health care provider:  About any new moles or changes in moles, especially if there is a change in a mole's shape or color.  If you have a mole that is larger than the size  of a pencil eraser.  If any of your family members has a history of skin cancer, especially at a young age, talk with your health care provider about genetic screening.  Always use sunscreen. Apply sunscreen liberally and repeatedly throughout the day.  Whenever you are outside, protect yourself by wearing long sleeves, pants, a wide-brimmed hat, and sunglasses. WHAT SHOULD I KNOW ABOUT OSTEOPOROSIS? Osteoporosis is a condition in which bone destruction happens more quickly than new bone creation. After menopause, you may be at an increased risk for osteoporosis. To help prevent osteoporosis or the bone fractures that can happen because of osteoporosis, the following is recommended:  If you are 52-64 years old, get at least 1,000 mg of calcium and at least 600 mg of vitamin D per day.  If you are older than age 80 but younger than age 87, get at least 1,200 mg of calcium and at least 600 mg of vitamin D per day.  If you are older than age 107, get at least 1,200 mg of calcium and at least 800 mg of vitamin D per day. Smoking and excessive alcohol intake increase the risk of osteoporosis. Eat foods that are rich in calcium and vitamin D, and do  weight-bearing exercises several times each week as directed by your health care provider. WHAT SHOULD I KNOW ABOUT HOW MENOPAUSE AFFECTS [place]? Depression may occur at any age, but it is more common as you become older. Common symptoms of depression include:  Low or sad mood.  Changes in sleep patterns.  Changes in appetite or eating patterns.  Feeling an overall lack of motivation or enjoyment of activities that you previously enjoyed.  Frequent crying spells. Talk with your health care provider if you think that you are experiencing depression. WHAT SHOULD I KNOW ABOUT IMMUNIZATIONS? It is important that you get and maintain your immunizations. These include:  Tetanus, diphtheria, and pertussis (Tdap) booster vaccine.  Influenza every year before the flu season begins.  Pneumonia vaccine.  Shingles vaccine. Your health care provider may also recommend other immunizations.   This information is not intended to replace advice given to you by your health care provider. Make sure you discuss any questions you have with your health care provider.   Document Released: 05/04/2005 Document Revised: 04/02/2014 Document Reviewed: 11/12/2013 Elsevier Interactive Patient Education [employer].

## 2015-06-17 NOTE — Progress Notes (Signed)
Subjective:    Patient ID: Shelly Bowers, female    DOB: Jul 29, 1950, 65 y.o.   MRN: [id number]  HPI    Subjective:    Patient ID: Shelly Bowers, female    DOB: May 21, 1950, 65 y.o.   MRN: [id number]  HPI 65 -year-old patient who is seen today for a preventive health examination. She is followed annually by Dr. Stann Mainland and also by orthopedic surgery. She does have a history of osteoarthritis. She remains on Celebrex daily.  Colonoscopy July 20, 2005  Family history father died at age 26 history of COPD and atrial fibrillation Mother age 47 with senile dementia one brother age 41 with atrial fibrillation  No past medical history on file.  Social History   Social History  . Marital Status: Married    Spouse Name: N/A  . Number of Children: N/A  . Years of Education: N/A   Occupational History  . Not on file.   Social History Main Topics  . Smoking status: Never Smoker   . Smokeless tobacco: Never Used  . Alcohol Use: Yes     Comment: occ  . Drug Use: No  . Sexual Activity: Not on file   Other Topics Concern  . Not on file   Social History Narrative    No past surgical history on file.  No family history on file.  Allergies  Allergen Reactions  . Codeine Sulfate     REACTION: unspecified    Current Outpatient Prescriptions on File Prior to Visit  Medication Sig Dispense Refill  . CELEBREX 200 MG capsule Take 200 mg by mouth 2 (two) times daily as needed.     Marland Kitchen ibuprofen (ADVIL,MOTRIN) 200 MG tablet Take 200 mg by mouth every 6 (six) hours as needed.       No current facility-administered medications on file prior to visit.    BP 150/90 mmHg  Pulse 64  Temp(Src) 98.1 F (36.7 C) (Oral)  Resp 20  Ht 5' 4.5" (1.638 m)  Wt 182 lb (82.555 kg)  BMI 30.77 kg/m2  SpO2 98%  1. Risk factors, based on past  M,S,F history- no cardiovascular risk factors except for her dyslipidemia  2.  Physical activities:  Limited by arthritis especially left hip pain  3.   Depression/mood: No history depression and mood disorder  4.  Hearing: No deficits  5.  ADL's: Independent in all aspects of daily living  6.  Fall risk: Increase to 2 left hip pain  7.  Home safety: No problems identified  8.  Height weight, and visual acuity; height and weight stable no change in visual acuity 9.  Counseling: Heart healthy diet and regular exercise regimen encouraged  10. Lab orders based on risk factors: Laboratory profile reviewed. Dyslipidemia and possible treatment discussed at length  11. Referral : Followup orthopedics an annual gynecologic examinations  12. Care plan: Continue heart healthy diet and efforts at weight loss  13. Cognitive assessment: Alert and oriented with normal affect. No cognitive dysfunction  14.  Preventive services will include annual health assessment and also annual gynecologic evaluation. Annual  Eye examination encouraged..   Patient was provided with a written and personalized care plan  15.  Provider list includes primary care ophthalmology gynecology and orthopedic surgery      Review of Systems  Constitutional: Negative.  Negative for fever, appetite change, fatigue and unexpected weight change.  HENT: Negative for congestion, dental problem, ear pain, hearing loss, mouth sores, nosebleeds, rhinorrhea, sinus pressure, sore throat,  tinnitus, trouble swallowing and voice change.   Eyes: Negative for photophobia, pain, discharge, redness and visual disturbance.  Respiratory: Negative for cough, chest tightness and shortness of breath.   Cardiovascular: Negative for chest pain, palpitations and leg swelling.  Gastrointestinal: Negative for nausea, vomiting, abdominal pain, diarrhea, constipation, blood in stool, abdominal distention and rectal pain.  Genitourinary: Negative for dysuria, urgency, frequency, hematuria, flank pain, vaginal bleeding, vaginal discharge, difficulty urinating, genital sores, vaginal pain, menstrual  problem and pelvic pain.  Musculoskeletal: Positive for arthralgias, gait problem and joint swelling. Negative for back pain and neck stiffness.  Skin: Negative for rash.  Neurological: Negative for dizziness, syncope, speech difficulty, weakness, light-headedness, numbness and headaches.  Hematological: Negative for adenopathy. Does not bruise/bleed easily.  Psychiatric/Behavioral: Negative for suicidal ideas, behavioral problems, self-injury, dysphoric mood and agitation. The patient is not nervous/anxious.        Objective:   Physical Exam  Constitutional: She is oriented to person, place, and time. She appears well-developed and well-nourished.  HENT:  Head: Normocephalic and atraumatic.  Right Ear: External ear normal.  Left Ear: External ear normal.  Mouth/Throat: Oropharynx is clear and moist.  Eyes: Conjunctivae and EOM are normal. Pupils are equal, round, and reactive to light.  Neck: Normal range of motion. Neck supple. No JVD present. No thyromegaly present.  Cardiovascular: Normal rate, regular rhythm, normal heart sounds and intact distal pulses.   No murmur heard. Pulmonary/Chest: Effort normal and breath sounds normal. She has no wheezes. She has no rales.  Abdominal: Soft. Bowel sounds are normal. She exhibits no distension and no mass. There is no tenderness. There is no rebound and no guarding.  Musculoskeletal: Normal range of motion. She exhibits no edema and no tenderness.  Lymphadenopathy:    She has no cervical adenopathy.  Neurological: She is alert and oriented to person, place, and time. She has normal reflexes. No cranial nerve deficit. She exhibits normal muscle tone. Coordination normal.  Skin: Skin is warm and dry. No rash noted.  Psychiatric: She has a normal mood and affect. Her behavior is normal.          Assessment & Plan:  Health maintenance exam We'll continue gynecologic followup Celebrex refilled Return here in one year or when  necessary Dyslipidemia.  We'll continue heart healthy diet  Review of Systems     as above Objective:   Physical Exam  Constitutional:  Blood pressure 130/72  Musculoskeletal:  Osteoarthritic changes involving the small joints of the hands    As above      Assessment & Plan:  As above

## 2015-07-11 ENCOUNTER — Ambulatory Visit (INDEPENDENT_AMBULATORY_CARE_PROVIDER_SITE_OTHER): Payer: Medicare HMO | Admitting: Adult Health

## 2015-07-11 ENCOUNTER — Telehealth: Payer: Self-pay | Admitting: Internal Medicine

## 2015-07-11 ENCOUNTER — Encounter: Payer: Self-pay | Admitting: Adult Health

## 2015-07-11 VITALS — BP 142/80 | Temp 98.0°F | Wt 180.0 lb

## 2015-07-11 DIAGNOSIS — R079 Chest pain, unspecified: Secondary | ICD-10-CM

## 2015-07-11 DIAGNOSIS — R002 Palpitations: Secondary | ICD-10-CM | POA: Diagnosis not present

## 2015-07-11 NOTE — Progress Notes (Signed)
   Subjective:    Patient ID: Shelly Bowers, female    DOB: Dec 26, 1950, 65 y.o.   MRN: [id number]  HPI  65 year old female who presents to the office today for palpitations and tightness in her chest. The first episode was Sunday April 2nd and this was palpitations without tightness, the palpitations woke her out of a sound sleep.   The last episode was last night and she woke up around 2 am with palpitations and chest tightness. This lasted about two hours. She has not had any episodes since. She denies any radiating pain, nausea, or numbness during these events.    Her father had a history of a fib.   Drinks two glasses of coffee in the morning but no other caffeine during the day.    Review of Systems  Constitutional: Negative.   HENT: Negative.   Respiratory: Positive for chest tightness. Negative for cough, shortness of breath and wheezing.   Cardiovascular: Positive for palpitations. Negative for leg swelling.  All other systems reviewed and are negative.  No past medical history on file.  Social History   Social History  . Marital Status: Married    Spouse Name: N/A  . Number of Children: N/A  . Years of Education: N/A   Occupational History  . Not on file.   Social History Main Topics  . Smoking status: Never Smoker   . Smokeless tobacco: Never Used  . Alcohol Use: Yes     Comment: occ  . Drug Use: No  . Sexual Activity: Not on file   Other Topics Concern  . Not on file   Social History Narrative    No past surgical history on file.  No family history on file.  Allergies  Allergen Reactions  . Codeine Sulfate     REACTION: unspecified    Current Outpatient Prescriptions on File Prior to Visit  Medication Sig Dispense Refill  . CELEBREX 200 MG capsule Take 200 mg by mouth 2 (two) times daily as needed.     Marland Kitchen ibuprofen (ADVIL,MOTRIN) 200 MG tablet Take 200 mg by mouth every 6 (six) hours as needed.       No current facility-administered  medications on file prior to visit.    BP 142/80 mmHg  Temp(Src) 98 F (36.7 C) (Oral)  Wt 180 lb (81.647 kg)       Objective:   Physical Exam  Constitutional: She is oriented to person, place, and time. She appears well-developed and well-nourished. No distress.  Cardiovascular: Normal rate, regular rhythm, normal heart sounds and intact distal pulses.  Exam reveals no gallop and no friction rub.   No murmur heard. Pulmonary/Chest: Effort normal and breath sounds normal. No respiratory distress. She has no wheezes. She has no rales. She exhibits no tenderness.  Neurological: She is alert and oriented to person, place, and time.  Skin: Skin is warm and dry. No rash noted. She is not diaphoretic. No erythema. No pallor.  Psychiatric: She has a normal mood and affect. Her behavior is normal. Judgment and thought content normal.  Nursing note and vitals reviewed.      Assessment & Plan:   1. Palpitations - EKG 12-Lead- NSR, Rate 64. Left atrial enlargement. There were no other EKGs to compare this to.  - Echocardiogram; Future - Holter monitor - 48 hour; Future - Follow up with any additional chest pain/palpitation.  - Consider referral to cardiology and/or stress test.   Dorothyann Peng, NP

## 2015-07-11 NOTE — Telephone Encounter (Signed)
Pt has appt with Tommi Rumps today at 2:45.

## 2015-07-11 NOTE — Patient Instructions (Addendum)
It was great meeting you today!  As discussed I think you are having palpitations.   I have ordered a holter monitor and echo cardiogram.   Follow up with Dr. Raliegh Ip if needed  Palpitations A palpitation is the feeling that your heartbeat is irregular or is faster than normal. It may feel like your heart is fluttering or skipping a beat. Palpitations are usually not a serious problem. However, in some cases, you may need further medical evaluation. CAUSES  Palpitations can be caused by:  Smoking.  Caffeine or other stimulants, such as diet pills or energy drinks.  Alcohol.  Stress and anxiety.  Strenuous physical activity.  Fatigue.  Certain medicines.  Heart disease, especially if you have a history of irregular heart rhythms (arrhythmias), such as atrial fibrillation, atrial flutter, or supraventricular tachycardia.  An improperly working pacemaker or defibrillator. DIAGNOSIS  To find the cause of your palpitations, your health care provider will take your medical history and perform a physical exam. Your health care provider may also have you take a test called an ambulatory electrocardiogram (ECG). An ECG records your heartbeat patterns over a 24-hour period. You may also have other tests, such as:  Transthoracic echocardiogram (TTE). During echocardiography, sound waves are used to evaluate how blood flows through your heart.  Transesophageal echocardiogram (TEE).  Cardiac monitoring. This allows your health care provider to monitor your heart rate and rhythm in real time.  Holter monitor. This is a portable device that records your heartbeat and can help diagnose heart arrhythmias. It allows your health care provider to track your heart activity for several days, if needed.  Stress tests by exercise or by giving medicine that makes the heart beat faster. TREATMENT  Treatment of palpitations depends on the cause of your symptoms and can vary greatly. Most cases of  palpitations do not require any treatment other than time, relaxation, and monitoring your symptoms. Other causes, such as atrial fibrillation, atrial flutter, or supraventricular tachycardia, usually require further treatment. HOME CARE INSTRUCTIONS   Avoid:  Caffeinated coffee, tea, soft drinks, diet pills, and energy drinks.  Chocolate.  Alcohol.  Stop smoking if you smoke.  Reduce your stress and anxiety. Things that can help you relax include:  A method of controlling things in your body, such as your heartbeats, with your mind (biofeedback).  Yoga.  Meditation.  Physical activity such as swimming, jogging, or walking.  Get plenty of rest and sleep. SEEK MEDICAL CARE IF:   You continue to have a fast or irregular heartbeat beyond 24 hours.  Your palpitations occur more often. SEEK IMMEDIATE MEDICAL CARE IF:  You have chest pain or shortness of breath.  You have a severe headache.  You feel dizzy or you faint. MAKE SURE YOU:  Understand these instructions.  Will watch your condition.  Will get help right away if you are not doing well or get worse.   This information is not intended to replace advice given to you by your health care provider. Make sure you discuss any questions you have with your health care provider.   Document Released: 03/09/2000 Document Revised: 03/17/2013 Document Reviewed: 05/11/2011 Elsevier Interactive Patient Education [employer].

## 2015-07-11 NOTE — Telephone Encounter (Signed)
Pt can be seen at 4:00 pm you will have to have Amanda or Normal open the slot.

## 2015-07-11 NOTE — Telephone Encounter (Signed)
Pt concern about tightness of chest have had 2 different episodes- 4/1 6:00am and 4/15 2:00am took baby ASA (heart health ASA).  Can we work this pt in today?

## 2015-07-12 LAB — COLOGUARD: Cologuard: NEGATIVE

## 2015-07-26 ENCOUNTER — Ambulatory Visit (HOSPITAL_COMMUNITY): Payer: Medicare HMO | Attending: Internal Medicine

## 2015-07-26 ENCOUNTER — Other Ambulatory Visit: Payer: Self-pay

## 2015-07-26 DIAGNOSIS — R079 Chest pain, unspecified: Secondary | ICD-10-CM

## 2015-07-26 DIAGNOSIS — I517 Cardiomegaly: Secondary | ICD-10-CM | POA: Insufficient documentation

## 2015-07-26 DIAGNOSIS — I5189 Other ill-defined heart diseases: Secondary | ICD-10-CM | POA: Diagnosis not present

## 2015-07-26 DIAGNOSIS — Z8249 Family history of ischemic heart disease and other diseases of the circulatory system: Secondary | ICD-10-CM | POA: Diagnosis not present

## 2015-07-26 DIAGNOSIS — I340 Nonrheumatic mitral (valve) insufficiency: Secondary | ICD-10-CM | POA: Diagnosis not present

## 2015-07-26 DIAGNOSIS — R002 Palpitations: Secondary | ICD-10-CM | POA: Diagnosis present

## 2015-07-26 DIAGNOSIS — I071 Rheumatic tricuspid insufficiency: Secondary | ICD-10-CM | POA: Insufficient documentation

## 2015-07-28 ENCOUNTER — Encounter: Payer: Self-pay | Admitting: Internal Medicine

## 2015-08-08 ENCOUNTER — Ambulatory Visit
Admission: RE | Admit: 2015-08-08 | Discharge: 2015-08-08 | Disposition: A | Discharge status: Home / Self Care | Payer: Medicare HMO | Source: Ambulatory Visit

## 2015-08-08 DIAGNOSIS — Z1231 Encounter for screening mammogram for malignant neoplasm of breast: Secondary | ICD-10-CM

## 2015-09-06 ENCOUNTER — Ambulatory Visit (INDEPENDENT_AMBULATORY_CARE_PROVIDER_SITE_OTHER): Payer: Medicare HMO | Admitting: Internal Medicine

## 2015-09-06 ENCOUNTER — Encounter: Payer: Self-pay | Admitting: Internal Medicine

## 2015-09-06 VITALS — BP 130/82 | HR 62 | Temp 98.6°F | Ht 64.5 in | Wt 176.0 lb

## 2015-09-06 DIAGNOSIS — G4700 Insomnia, unspecified: Secondary | ICD-10-CM | POA: Diagnosis not present

## 2015-09-06 MED ORDER — TEMAZEPAM 15 MG PO CAPS: 15.0000 mg | ORAL_CAPSULE | Freq: Every evening | ORAL | Status: DC | PRN

## 2015-09-06 NOTE — Progress Notes (Signed)
   Subjective:    Patient ID: Shelly Bowers, female    DOB: 08-14-1950, 65 y.o.   MRN: [id number]  HPI  65 year old patient who presents with a two-week history of insomnia.  She occasionally has difficulty falling asleep but frequent awakening is her main concern.  At times she is bothered by pain in the left hip area. She generally tries to go to sleep between 10 PM and 10:30 and often sleeps till 8:00 or later.  She was employed.  She basically got a Tate half hours of sleep per night.  No daytime napping.  No excessive caffeine load.  No stressors that she is aware of  No past medical history on file.   Social History   Social History  . Marital Status: Married    Spouse Name: N/A  . Number of Children: N/A  . Years of Education: N/A   Occupational History  . Not on file.   Social History Main Topics  . Smoking status: Never Smoker   . Smokeless tobacco: Never Used  . Alcohol Use: Yes     Comment: occ  . Drug Use: No  . Sexual Activity: Not on file   Other Topics Concern  . Not on file   Social History Narrative    No past surgical history on file.  No family history on file.  Allergies  Allergen Reactions  . Codeine Sulfate     REACTION: unspecified    Current Outpatient Prescriptions on File Prior to Visit  Medication Sig Dispense Refill  . CELEBREX 200 MG capsule Take 200 mg by mouth 2 (two) times daily as needed.     Marland Kitchen ibuprofen (ADVIL,MOTRIN) 200 MG tablet Take 200 mg by mouth every 6 (six) hours as needed.       No current facility-administered medications on file prior to visit.    BP 130/82 mmHg  Pulse 62  Temp(Src) 98.6 F (37 C) (Oral)  Ht 5' 4.5" (1.638 m)  Wt 176 lb (79.833 kg)  BMI 29.75 kg/m2  SpO2 97%     Review of Systems  Constitutional: Negative.   HENT: Negative for congestion, dental problem, hearing loss, rhinorrhea, sinus pressure, sore throat and tinnitus.   Eyes: Negative for pain, discharge and visual disturbance.    Respiratory: Negative for cough and shortness of breath.   Cardiovascular: Negative for chest pain, palpitations and leg swelling.  Gastrointestinal: Negative for nausea, vomiting, abdominal pain, diarrhea, constipation, blood in stool and abdominal distention.  Genitourinary: Negative for dysuria, urgency, frequency, hematuria, flank pain, vaginal bleeding, vaginal discharge, difficulty urinating, vaginal pain and pelvic pain.  Musculoskeletal: Negative for joint swelling, arthralgias and gait problem.  Skin: Negative for rash.  Neurological: Negative for dizziness, syncope, speech difficulty, weakness, numbness and headaches.  Hematological: Negative for adenopathy.  Psychiatric/Behavioral: Positive for sleep disturbance. Negative for behavioral problems, dysphoric mood and agitation. The patient is not nervous/anxious.        Objective:   Physical Exam  Constitutional: She appears well-developed and well-nourished. No distress.  Blood pressure well controlled          Assessment & Plan:   Insomnia.  Sleep hygiene issues discussed.  She will attempt to limit her sleep cycle to 8 hours Will treat with temazepam which has been helpful over the past couple of weeks Trial of melatonin  Nyoka Cowden, MD

## 2015-09-06 NOTE — Progress Notes (Signed)
Pre visit review using our clinic review tool, if applicable. No additional management support is needed unless otherwise documented below in the visit note. 

## 2015-09-06 NOTE — Patient Instructions (Signed)
Insomnia Insomnia is a sleep disorder that makes it difficult to fall asleep or to stay asleep. Insomnia can cause tiredness (fatigue), low energy, difficulty concentrating, mood swings, and poor performance at work or school.  There are three different ways to classify insomnia:  Difficulty falling asleep.  Difficulty staying asleep.  Waking up too early in the morning. Any type of insomnia can be long-term (chronic) or short-term (acute). Both are common. Short-term insomnia usually lasts for three months or less. Chronic insomnia occurs at least three times a week for longer than three months. CAUSES  Insomnia may be caused by another condition, situation, or substance, such as:  Anxiety.  Certain medicines.  Gastroesophageal reflux disease (GERD) or other gastrointestinal conditions.  Asthma or other breathing conditions.  Restless legs syndrome, sleep apnea, or other sleep disorders.  Chronic pain.  Menopause. This may include hot flashes.  Stroke.  Abuse of alcohol, tobacco, or illegal drugs.  Depression.  Caffeine.   Neurological disorders, such as Alzheimer disease.  An overactive thyroid (hyperthyroidism). The cause of insomnia may not be known. RISK FACTORS Risk factors for insomnia include:  Gender. Women are more commonly affected than men.  Age. Insomnia is more common as you get older.  Stress. This may involve your professional or personal life.  Income. Insomnia is more common in people with lower income.  Lack of exercise.   Irregular work schedule or night shifts.  Traveling between different time zones. SIGNS AND SYMPTOMS If you have insomnia, trouble falling asleep or trouble staying asleep is the main symptom. This may lead to other symptoms, such as:  Feeling fatigued.  Feeling nervous about going to sleep.  Not feeling rested in the morning.  Having trouble concentrating.  Feeling irritable, anxious, or depressed. TREATMENT   Treatment for insomnia depends on the cause. If your insomnia is caused by an underlying condition, treatment will focus on addressing the condition. Treatment may also include:   Medicines to help you sleep.  Counseling or therapy.  Lifestyle adjustments. HOME CARE INSTRUCTIONS   Take medicines only as directed by your health care provider.  Keep regular sleeping and waking hours. Avoid naps.  Keep a sleep diary to help you and your health care provider figure out what could be causing your insomnia. Include:   When you sleep.  When you wake up during the night.  How well you sleep.   How rested you feel the next day.  Any side effects of medicines you are taking.  What you eat and drink.   Make your bedroom a comfortable place where it is easy to fall asleep:  Put up shades or special blackout curtains to block light from outside.  Use a white noise machine to block noise.  Keep the temperature cool.   Exercise regularly as directed by your health care provider. Avoid exercising right before bedtime.  Use relaxation techniques to manage stress. Ask your health care provider to suggest some techniques that may work well for you. These may include:  Breathing exercises.  Routines to release muscle tension.  Visualizing peaceful scenes.  Cut back on alcohol, caffeinated beverages, and cigarettes, especially close to bedtime. These can disrupt your sleep.  Do not overeat or eat spicy foods right before bedtime. This can lead to digestive discomfort that can make it hard for you to sleep.  Limit screen use before bedtime. This includes:  Watching TV.  Using your smartphone, tablet, and computer.  Stick to a routine. This   can help you fall asleep faster. Try to do a quiet activity, brush your teeth, and go to bed at the same time each night.  Get out of bed if you are still awake after 15 minutes of trying to sleep. Keep the lights down, but try reading or  doing a quiet activity. When you feel sleepy, go back to bed.  Make sure that you drive carefully. Avoid driving if you feel very sleepy.  Keep all follow-up appointments as directed by your health care provider. This is important. SEEK MEDICAL CARE IF:   You are tired throughout the day or have trouble in your daily routine due to sleepiness.  You continue to have sleep problems or your sleep problems get worse. SEEK IMMEDIATE MEDICAL CARE IF:   You have serious thoughts about hurting yourself or someone else.   This information is not intended to replace advice given to you by your health care provider. Make sure you discuss any questions you have with your health care provider.   Document Released: 03/09/2000 Document Revised: 12/01/2014 Document Reviewed: 12/11/2013 Elsevier Interactive Patient Education 2016 Elsevier Inc[employer]  

## 2015-12-27 ENCOUNTER — Encounter (HOSPITAL_BASED_OUTPATIENT_CLINIC_OR_DEPARTMENT_OTHER): Payer: Self-pay | Admitting: *Deleted

## 2015-12-27 ENCOUNTER — Encounter (HOSPITAL_BASED_OUTPATIENT_CLINIC_OR_DEPARTMENT_OTHER): Payer: Self-pay | Admitting: Anesthesiology

## 2015-12-27 ENCOUNTER — Ambulatory Visit (HOSPITAL_BASED_OUTPATIENT_CLINIC_OR_DEPARTMENT_OTHER)
Admission: RE | Admit: 2015-12-27 | Discharge: 2015-12-27 | Disposition: A | Discharge status: Home / Self Care | Payer: Medicare HMO | Source: Ambulatory Visit | Attending: Orthopedic Surgery | Admitting: Orthopedic Surgery

## 2015-12-27 ENCOUNTER — Encounter (HOSPITAL_BASED_OUTPATIENT_CLINIC_OR_DEPARTMENT_OTHER)
Admission: RE | Disposition: A | Discharge status: Home / Self Care | Payer: Self-pay | Source: Ambulatory Visit | Attending: Orthopedic Surgery

## 2015-12-27 DIAGNOSIS — L03012 Cellulitis of left finger: Secondary | ICD-10-CM | POA: Insufficient documentation

## 2015-12-27 DIAGNOSIS — L980 Pyogenic granuloma: Secondary | ICD-10-CM | POA: Insufficient documentation

## 2015-12-27 HISTORY — PX: INCISION AND DRAINAGE ABSCESS: SHX5864

## 2015-12-27 SURGERY — INCISION AND DRAINAGE, ABSCESS
Anesthesia: LOCAL | Laterality: Left

## 2015-12-27 MED ORDER — HYDROCODONE-ACETAMINOPHEN 5-325 MG PO TABS: 1.0000 | ORAL_TABLET | Freq: Four times a day (QID) | ORAL | 0 refills | Status: DC | PRN

## 2015-12-27 MED ORDER — LIDOCAINE HCL (PF) 1 % IJ SOLN
INTRAMUSCULAR | Status: AC
  Filled 2015-12-27: qty 30

## 2015-12-27 MED ORDER — BUPIVACAINE HCL (PF) 0.25 % IJ SOLN
INTRAMUSCULAR | Status: DC | PRN
  Administered 2015-12-27: 3.5 mL

## 2015-12-27 MED ORDER — LIDOCAINE HCL (PF) 1 % IJ SOLN
INTRAMUSCULAR | Status: DC | PRN
  Administered 2015-12-27: 3.5 mL

## 2015-12-27 MED ORDER — BUPIVACAINE HCL (PF) 0.25 % IJ SOLN
INTRAMUSCULAR | Status: AC
  Filled 2015-12-27: qty 30

## 2015-12-27 SURGICAL SUPPLY — 51 items
BAG DECANTER FOR FLEXI CONT (MISCELLANEOUS) IMPLANT
BLADE MINI RND TIP GREEN BEAV (BLADE) IMPLANT
BLADE SURG 15 STRL LF DISP TIS (BLADE) ×1 IMPLANT
BLADE SURG 15 STRL SS (BLADE) ×2
BNDG CMPR 9X4 STRL LF SNTH (GAUZE/BANDAGES/DRESSINGS)
BNDG COHESIVE 1X5 TAN STRL LF (GAUZE/BANDAGES/DRESSINGS) ×1 IMPLANT
BNDG COHESIVE 3X5 TAN STRL LF (GAUZE/BANDAGES/DRESSINGS) IMPLANT
BNDG ESMARK 4X9 LF (GAUZE/BANDAGES/DRESSINGS) IMPLANT
BNDG GAUZE ELAST 4 BULKY (GAUZE/BANDAGES/DRESSINGS) IMPLANT
CHLORAPREP W/TINT 26ML (MISCELLANEOUS) ×1 IMPLANT
CORDS BIPOLAR (ELECTRODE) ×2 IMPLANT
COVER BACK TABLE 60X90IN (DRAPES) ×2 IMPLANT
COVER MAYO STAND STRL (DRAPES) ×2 IMPLANT
CUFF TOURNIQUET SINGLE 18IN (TOURNIQUET CUFF) ×1 IMPLANT
DRAIN PENROSE 1/2X12 LTX STRL (WOUND CARE) ×1 IMPLANT
DRAPE EXTREMITY T 121X128X90 (DRAPE) ×2 IMPLANT
DRAPE SURG 17X23 STRL (DRAPES) ×2 IMPLANT
GAUZE PACKING IODOFORM 1/4X15 (GAUZE/BANDAGES/DRESSINGS) IMPLANT
GAUZE SPONGE 4X4 12PLY STRL (GAUZE/BANDAGES/DRESSINGS) ×2 IMPLANT
GAUZE XEROFORM 1X8 LF (GAUZE/BANDAGES/DRESSINGS) ×2 IMPLANT
GLOVE BIOGEL PI IND STRL 7.0 (GLOVE) IMPLANT
GLOVE BIOGEL PI IND STRL 8.5 (GLOVE) ×1 IMPLANT
GLOVE BIOGEL PI INDICATOR 7.0 (GLOVE) ×1
GLOVE BIOGEL PI INDICATOR 8.5 (GLOVE) ×1
GLOVE ECLIPSE 6.5 STRL STRAW (GLOVE) ×1 IMPLANT
GLOVE SURG ORTHO 8.0 STRL STRW (GLOVE) ×2 IMPLANT
GLOVE SURG SS PI 7.0 STRL IVOR (GLOVE) ×1 IMPLANT
GOWN STRL REUS W/ TWL LRG LVL3 (GOWN DISPOSABLE) ×1 IMPLANT
GOWN STRL REUS W/TWL LRG LVL3 (GOWN DISPOSABLE) ×2
GOWN STRL REUS W/TWL XL LVL3 (GOWN DISPOSABLE) ×2 IMPLANT
LOOP VESSEL MAXI BLUE (MISCELLANEOUS) IMPLANT
NDL PRECISIONGLIDE 27X1.5 (NEEDLE) IMPLANT
NEEDLE PRECISIONGLIDE 27X1.5 (NEEDLE) IMPLANT
NS IRRIG 1000ML POUR BTL (IV SOLUTION) ×2 IMPLANT
PACK BASIN DAY SURGERY FS (CUSTOM PROCEDURE TRAY) ×2 IMPLANT
PAD CAST 3X4 CTTN HI CHSV (CAST SUPPLIES) IMPLANT
PADDING CAST ABS 4INX4YD NS (CAST SUPPLIES) ×1
PADDING CAST ABS COTTON 4X4 ST (CAST SUPPLIES) ×1 IMPLANT
PADDING CAST COTTON 3X4 STRL (CAST SUPPLIES)
SPLINT PLASTER CAST XFAST 3X15 (CAST SUPPLIES) IMPLANT
SPLINT PLASTER XTRA FASTSET 3X (CAST SUPPLIES)
STOCKINETTE 4X48 STRL (DRAPES) ×2 IMPLANT
SUT ETHILON 4 0 PS 2 18 (SUTURE) ×1 IMPLANT
SWAB COLLECTION DEVICE MRSA (MISCELLANEOUS) IMPLANT
SWAB CULTURE ESWAB REG 1ML (MISCELLANEOUS) IMPLANT
SYR BULB 3OZ (MISCELLANEOUS) ×2 IMPLANT
SYR CONTROL 10ML LL (SYRINGE) IMPLANT
TOWEL OR 17X24 6PK STRL BLUE (TOWEL DISPOSABLE) ×2 IMPLANT
TRAY DSU PREP LF (CUSTOM PROCEDURE TRAY) ×1 IMPLANT
TUBE FEEDING 5FR 15 INCH (TUBING) IMPLANT
UNDERPAD 30X30 (UNDERPADS AND DIAPERS) ×2 IMPLANT

## 2015-12-27 NOTE — Brief Op Note (Signed)
12/27/2015  4:11 PM  PATIENT:  Shelly Bowers  65 y.o. female  PRE-OPERATIVE DIAGNOSIS:  infected left thumb  POST-OPERATIVE DIAGNOSIS:  * No post-op diagnosis entered *  PROCEDURE:  Procedure(s): INCISION AND DRAINAGE ABSCESS (Left)  SURGEON:  Surgeon(s) and Role:    * Daryll Brod, MD - Primary  PHYSICIAN ASSISTANT:   ASSISTANTS: none   ANESTHESIA:   local  EBL:  No intake/output data recorded.  BLOOD ADMINISTERED:none  DRAINS: none   LOCAL MEDICATIONS USED:  BUPIVICAINE  and XYLOCAINE   SPECIMEN:  Excision  DISPOSITION OF SPECIMEN:  PATHOLOGY  COUNTS:  YES  TOURNIQUET:  * No tourniquets in log *  DICTATION: .Other Dictation: Dictation Number [id number]  PLAN OF CARE: Discharge to home after PACU  PATIENT DISPOSITION:  PACU - hemodynamically stable.

## 2015-12-27 NOTE — H&P (Signed)
  Shelly Bowers is an 65 y.o. female.   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 She states that this is been present for 2 weeks. She states that she may have a foreign body in the ulnar aspect of the thumb or may have pulled back a cuticle. She has had no previous medical treatment for this predicament. She had telephoned our office today concerned about the progression of the thumb pain and swelling and requested to be seen.  Past medical history reveals allergy to codeine with a history of rash. Her only medication is Celebrex that she takes 1 or 2 p.o. q. day as needed. Does have a history of arthritis. There is no history of hypertension diabetes or thyroid disease or heart disease. Only surgery was bilateral great toe surgery and left small finger surgery. The patient does not smoke. She consumes 1-2 alcohol based beverages per day.(Dasnoit)      History reviewed. No pertinent past medical history.  History reviewed. No pertinent surgical history.  History reviewed. No pertinent family history. Social History:  reports that she has never smoked. She has never used smokeless tobacco. She reports that she drinks alcohol. She reports that she does not use drugs.  Allergies:  Allergies  Allergen Reactions  . Codeine Sulfate     REACTION: unspecified    Medications Prior to Admission  Medication Sig Dispense Refill  . CELEBREX 200 MG capsule Take 200 mg by mouth 2 (two) times daily as needed.     Marland Kitchen ibuprofen (ADVIL,MOTRIN) 200 MG tablet Take 200 mg by mouth every 6 (six) hours as needed.      . temazepam (RESTORIL) 15 MG capsule Take 1 capsule (15 mg total) by mouth at bedtime as needed for sleep. 30 capsule 0    No results found for this or any previous visit (from the past 48 hour(s)).  No results found.   Pertinent items  are noted in HPI.  Blood pressure (!) 176/78, pulse 67, temperature 98.6 F (37 C), temperature source Oral, resp. rate 18, SpO2 99 %.  General appearance: alert, cooperative and appears stated age Head: Normocephalic, without obvious abnormality Neck: no JVD Resp: clear to auscultation bilaterally Cardio: regular rate and rhythm, S1, S2 normal, no murmur, click, rub or gallop GI: soft, non-tender; bowel sounds normal; no masses,  no organomegaly Extremities: mass left thumb Pulses: 2+ and symmetric Skin: Skin color, texture, turgor normal. No rashes or lesions Neurologic: Grossly normal Incision/Wound: thumb  Assessment/Plan Pyogenic granuloma left thumb Plan: excision with partial nail removal  Shelly Bowers R 12/27/2015, 1:23 PM

## 2015-12-27 NOTE — Discharge Instructions (Signed)

## 2015-12-27 NOTE — Op Note (Signed)
Dictation Number [id number]

## 2015-12-28 ENCOUNTER — Encounter (HOSPITAL_BASED_OUTPATIENT_CLINIC_OR_DEPARTMENT_OTHER): Payer: Self-pay | Admitting: Orthopedic Surgery

## 2015-12-28 NOTE — Op Note (Signed)
NAMEOASIS, FREDRICH NO.:  [id number]  MEDICAL RECORD NO.:  [id number]  LOCATION:                                 FACILITY:  PHYSICIAN:  Daryll Brod, M.D.       DATE OF BIRTH:  06-24-1950  DATE OF PROCEDURE:  12/27/2015 DATE OF DISCHARGE:                              OPERATIVE REPORT   PREOPERATIVE DIAGNOSIS:  Pyogenic granuloma and paronychia of the left thumb.  POSTOPERATIVE DIAGNOSIS:  Pyogenic granuloma and paronychia of the left thumb.  OPERATION:  Removal of partial nail with excision of pyogenic granuloma and closure of left thumb.  SURGEON:  Daryll Brod, M.D.  ANESTHESIA:  Metacarpal block.  HISTORY:  The patient is a 65 year old female with a history of a hangnail which she attempted to remove.  This has grown into a mass on the ulnar aspect of her nail with significant pain and overgrowing of the nail plate.  The pressure causes increased discomfort for her.  She is advised to have this excised along the portion of the nail plate. Pre, peri and postoperative course have been discussed along with risks and complications.  She is aware there is no guarantee with the surgery, possibility of infection, recurrence of injury to arteries, nerves, tendons, incomplete relief of symptoms and dystrophy and recurrence. Preoperative area the patient is seen, the extremity marked by both patient and surgeon.  PROCEDURE IN DETAIL:  The patient was brought to the operating room, where a metacarpal block was given with 0.25% bupivacaine and 1% Xylocaine both without epinephrine, 7 mL was used.  She was prepped using Betadine scrub and solution with the left arm free.  A Penrose drain was used for tourniquet control at the base of the thumb after adequate anesthesia was afforded.  This allowed undercutting of the nail plate with a periosteal elevator.  This allowed the ulnar aspect of the nail plate to be elevated and removed.  The pyogenic granuloma was  then excised.  The area was irrigated, the nail fold was repaired with interrupted 6-0 chromic sutures.  The size of the pyogenic granuloma was approximately 1.5 cm in length.  The patient tolerated the procedure well.  A sterile compressive dressing and splint was applied.  She was taken to the recovery room for observation in satisfactory condition. She will be discharged home to return to the [facility] in 1 week on hydrocodone and Norco 5/325.          ______________________________ Daryll Brod, M.D.    GK/MEDQ  D:  12/27/2015  T:  12/28/2015  Job:  [id number]

## 2016-01-04 DIAGNOSIS — L929 Granulomatous disorder of the skin and subcutaneous tissue, unspecified: Secondary | ICD-10-CM | POA: Insufficient documentation

## 2016-01-11 ENCOUNTER — Ambulatory Visit (INDEPENDENT_AMBULATORY_CARE_PROVIDER_SITE_OTHER): Payer: Medicare HMO

## 2016-01-11 DIAGNOSIS — Z23 Encounter for immunization: Secondary | ICD-10-CM

## 2016-03-06 ENCOUNTER — Encounter: Payer: Self-pay | Admitting: Internal Medicine

## 2016-03-28 ENCOUNTER — Ambulatory Visit (INDEPENDENT_AMBULATORY_CARE_PROVIDER_SITE_OTHER): Payer: Medicare HMO | Admitting: Podiatry

## 2016-03-28 ENCOUNTER — Encounter: Payer: Self-pay | Admitting: Podiatry

## 2016-03-28 ENCOUNTER — Ambulatory Visit (INDEPENDENT_AMBULATORY_CARE_PROVIDER_SITE_OTHER): Payer: Medicare HMO

## 2016-03-28 VITALS — BP 169/82 | HR 61 | Resp 16

## 2016-03-28 DIAGNOSIS — M722 Plantar fascial fibromatosis: Secondary | ICD-10-CM

## 2016-03-28 DIAGNOSIS — R600 Localized edema: Secondary | ICD-10-CM

## 2016-03-28 DIAGNOSIS — S99921A Unspecified injury of right foot, initial encounter: Secondary | ICD-10-CM

## 2016-03-28 MED ORDER — TRIAMCINOLONE ACETONIDE 10 MG/ML IJ SUSP
10.0000 mg | Freq: Once | INTRAMUSCULAR | Status: AC
  Administered 2016-03-28: 10 mg

## 2016-03-29 NOTE — Progress Notes (Signed)
Subjective:     Patient ID: Quinteria Woodmansee, female   DOB: 12/23/1950, 66 y.o.   MRN: [id number]  HPI patient states her right arch is been hurting and she thinks she traumatized it in the last several months   Review of Systems  All other systems reviewed and are negative.      Objective:   Physical Exam  Constitutional: She is oriented to person, place, and time.  Cardiovascular: Intact distal pulses.   Musculoskeletal: Normal range of motion.  Neurological: She is oriented to person, place, and time.  Skin: Skin is warm.  Nursing note and vitals reviewed.  neurovascular status intact muscle strength adequate and inflammation of the distal part of the plantar fascial right with fluid buildup. Patient's found have good digital perfusion is well oriented 3 and no indications of tear of the fascia     Assessment:     Inflamed plantar fascial right distal portion secondary to probable injury with chronic nature to condition and at this time    Plan:     H&P condition reviewed and careful injection administered distal and applied fascial brace and gave instructions on heat ice therapy for this area. Reappoint several weeks to recheck or earlier if needed  X-rays were negative for signs that there is a fracture or a bony contusion

## 2016-04-25 ENCOUNTER — Ambulatory Visit (INDEPENDENT_AMBULATORY_CARE_PROVIDER_SITE_OTHER): Payer: Medicare HMO | Admitting: Podiatry

## 2016-04-25 ENCOUNTER — Encounter: Payer: Self-pay | Admitting: Podiatry

## 2016-04-25 DIAGNOSIS — S99921A Unspecified injury of right foot, initial encounter: Secondary | ICD-10-CM

## 2016-04-25 DIAGNOSIS — M722 Plantar fascial fibromatosis: Secondary | ICD-10-CM | POA: Diagnosis not present

## 2016-04-26 NOTE — Progress Notes (Addendum)
Subjective:     Patient ID: Shelly Bowers, female   DOB: 1950/11/15, 66 y.o.   MRN: [id number]  HPI patient states she injured her right foot and also has plantar fascial symptomatology right   Review of Systems     Objective:   Physical Exam Neurological intact vascular intact with patient found to have edema in the lateral side right foot secondary to injury and mild to moderate discomfort plantar heel    Assessment:     Edematous process with plantar fasciitis    Plan:     Advised on physical therapy ice therapy and dispensed stocking to control compression and edema. Reappoint as needed  X-ray indicated no signs of fracture or other bone pathology

## 2016-05-15 ENCOUNTER — Ambulatory Visit (INDEPENDENT_AMBULATORY_CARE_PROVIDER_SITE_OTHER): Payer: Medicare HMO | Admitting: Family Medicine

## 2016-05-15 ENCOUNTER — Encounter: Payer: Self-pay | Admitting: Family Medicine

## 2016-05-15 ENCOUNTER — Ambulatory Visit (INDEPENDENT_AMBULATORY_CARE_PROVIDER_SITE_OTHER)
Admission: RE | Admit: 2016-05-15 | Discharge: 2016-05-15 | Disposition: A | Discharge status: Home / Self Care | Payer: Medicare HMO | Source: Ambulatory Visit | Attending: Family Medicine | Admitting: Family Medicine

## 2016-05-15 VITALS — BP 140/78 | HR 83 | Temp 98.5°F | Ht 64.5 in | Wt 175.9 lb

## 2016-05-15 DIAGNOSIS — R05 Cough: Secondary | ICD-10-CM

## 2016-05-15 DIAGNOSIS — J988 Other specified respiratory disorders: Secondary | ICD-10-CM

## 2016-05-15 DIAGNOSIS — R059 Cough, unspecified: Secondary | ICD-10-CM

## 2016-05-15 MED ORDER — BENZONATATE 100 MG PO CAPS: 100.0000 mg | ORAL_CAPSULE | Freq: Three times a day (TID) | ORAL | 0 refills | Status: DC | PRN

## 2016-05-15 MED ORDER — PREDNISONE 10 MG PO TABS: ORAL_TABLET | ORAL | 0 refills | Status: DC

## 2016-05-15 NOTE — Progress Notes (Signed)
HPI:  Acute visit for URI: -started: 3-4 days ago -symptoms: congestion, sore throat, deep cough, felt had rattle or wheeze in chest at times -denies:fever, SOB, NVD, tooth pain, ear pain -has tried:  nothing -sick contacts/travel/risks: no reported flu, strep or tick exposure    ROS: See pertinent positives and negatives per HPI.  No past medical history on file.  Past Surgical History:  Procedure Laterality Date  . INCISION AND DRAINAGE ABSCESS Left 12/27/2015   Procedure: INCISION AND DRAINAGE ABSCESS;  Surgeon: Daryll Brod, MD;  Location: [facility];  Service: Orthopedics;  Laterality: Left;    No family history on file.  Social History   Social History  . Marital status: Married    Spouse name: N/A  . Number of children: N/A  . Years of education: N/A   Social History Main Topics  . Smoking status: Never Smoker  . Smokeless tobacco: Never Used  . Alcohol use Yes     Comment: occ  . Drug use: No  . Sexual activity: Not Asked   Other Topics Concern  . None   Social History Narrative  . None     Current Outpatient Prescriptions:  .  CELEBREX 200 MG capsule, Take 200 mg by mouth 2 (two) times daily as needed. , Disp: , Rfl:  .  ibuprofen (ADVIL,MOTRIN) 200 MG tablet, Take 200 mg by mouth every 6 (six) hours as needed.  , Disp: , Rfl:  .  temazepam (RESTORIL) 15 MG capsule, Take 1 capsule (15 mg total) by mouth at bedtime as needed for sleep., Disp: 30 capsule, Rfl: 0 .  benzonatate (TESSALON) 100 MG capsule, Take 1 capsule (100 mg total) by mouth 3 (three) times daily as needed for cough., Disp: 20 capsule, Rfl: 0 .  predniSONE (DELTASONE) 10 MG tablet, 40mg  (4 tablets) x2 days, then 20mg  (2 tablets) x2 days, then 10mg  (1 tab) x 2 days, Disp: 14 tablet, Rfl: 0  EXAM:  Vitals:   05/15/16 1028  BP: 140/78  Pulse: 83  Temp: 98.5 F (36.9 C)    Body mass index is 29.73 kg/m.  GENERAL: vitals reviewed and listed above, alert, oriented,  appears well hydrated and in no acute distress  HEENT: atraumatic, conjunttiva clear, no obvious abnormalities on inspection of external nose and ears, normal appearance of ear canals and TMs, clear nasal congestion, mild post oropharyngeal erythema with PND, no tonsillar edema or exudate, no sinus TTP  NECK: no obvious masses on inspection  LUNGS: clear to auscultation bilaterally, no wheezes, rales or rhonchi, good air movement  CV: HRRR, no peripheral edema  MS: moves all extremities without noticeable abnormality  PSYCH: pleasant and cooperative, no obvious depression or anxiety  ASSESSMENT AND PLAN:  Discussed the following assessment and plan:  Cough - Plan: DG Chest 2 View  Respiratory infection   We discussed potential etiologies, with VURI vs bronchitis being most likely. CXR to exclude other, tessalon for cough, prednisone if not improving.  We discussed treatment side effects, likely course, antibiotic misuse, transmission, and signs of developing a serious illness.  -of course, we advised to return or notify a doctor immediately if symptoms worsen or persist or new concerns arise.    Patient Instructions  BEFORE YOU LEAVE: -xray sheet  Go get the xray today  Plenty of fluids and rest  Tessalon for cough as needed  Prednisone taper per instructions if any further wheezing or if cough continues  Seek care immediately if worsening,  new symptoms, struggling to breath or not improving over the next several days    Colin Benton R., DO

## 2016-05-15 NOTE — Patient Instructions (Signed)
BEFORE YOU LEAVE: -xray sheet  Go get the xray today  Plenty of fluids and rest  Tessalon for cough as needed  Prednisone taper per instructions if any further wheezing or if cough continues  Seek care immediately if worsening, new symptoms, struggling to breath or not improving over the next several days

## 2016-05-15 NOTE — Progress Notes (Signed)
Pre visit review using our clinic review tool, if applicable. No additional management support is needed unless otherwise documented below in the visit note. 

## 2016-05-16 ENCOUNTER — Telehealth: Payer: Self-pay | Admitting: Internal Medicine

## 2016-05-16 NOTE — Telephone Encounter (Signed)
Pt informed that xray results are normal. Pt verbalized understanding

## 2016-05-16 NOTE — Telephone Encounter (Signed)
Pt would like to have her xray results. °

## 2016-06-26 ENCOUNTER — Other Ambulatory Visit: Payer: Medicare HMO

## 2016-07-02 ENCOUNTER — Other Ambulatory Visit: Payer: Self-pay | Admitting: Obstetrics & Gynecology

## 2016-07-02 DIAGNOSIS — Z1231 Encounter for screening mammogram for malignant neoplasm of breast: Secondary | ICD-10-CM

## 2016-07-03 ENCOUNTER — Encounter: Payer: Medicare HMO | Admitting: Internal Medicine

## 2016-07-18 ENCOUNTER — Telehealth: Payer: Self-pay | Admitting: *Deleted

## 2016-07-18 NOTE — Telephone Encounter (Signed)
Pt asked if she could send a picture to Dr. Paulla Dolly showing the swelling in the right foot and wanted to know if she needed to continue the compression sock. Pt states she is now having pain between the 4th and 5th toes of the right foot, and not in the arch. I left message informing pt she could send a picture to my email and I would give to Dr. Paulla Dolly, but I was concerned that she may have a soft corn, or hole or macerated skin between the two toes, if so then I instructed pt to perform epsom salt 1/2 cup in 1 qt warm water and place plain dry gauze between the toes, and we would see her Monday.

## 2016-07-23 ENCOUNTER — Ambulatory Visit (INDEPENDENT_AMBULATORY_CARE_PROVIDER_SITE_OTHER): Payer: Medicare HMO | Admitting: Podiatry

## 2016-07-23 ENCOUNTER — Ambulatory Visit (INDEPENDENT_AMBULATORY_CARE_PROVIDER_SITE_OTHER): Payer: Medicare HMO

## 2016-07-23 DIAGNOSIS — M84374A Stress fracture, right foot, initial encounter for fracture: Secondary | ICD-10-CM

## 2016-07-23 DIAGNOSIS — R52 Pain, unspecified: Secondary | ICD-10-CM

## 2016-07-23 NOTE — Progress Notes (Signed)
Subjective:    Patient ID: Shelly Bowers, female   DOB: 66 y.o.   MRN: [id number]   HPI patient presents stating she is developed a lot of pain in the dorsum of her right foot after working furniture marked and was quite active on her foot    ROS      Objective:  Physical Exam Neurovascular status intact with quite a bit of discomfort on the dorsal lateral aspect of the right foot around the fourth metatarsal distal shaft    Assessment:    Possibility for stress fracture versus inflammatory condition     Plan:   X-ray reviewed with patient and at this time applied Unna boot Ace wrap to reduce the swelling and went ahead and advised on elevation surgical shoe usage and reappoint 2 weeks or earlier if needed  X-ray was not conclusive yet as to whether or not there may be stress fracture present

## 2016-07-25 ENCOUNTER — Ambulatory Visit (INDEPENDENT_AMBULATORY_CARE_PROVIDER_SITE_OTHER): Payer: Medicare HMO | Admitting: Internal Medicine

## 2016-07-25 ENCOUNTER — Encounter: Payer: Self-pay | Admitting: Internal Medicine

## 2016-07-25 VITALS — BP 164/90 | HR 64 | Temp 98.0°F | Ht 65.5 in | Wt 180.4 lb

## 2016-07-25 DIAGNOSIS — Z0000 Encounter for general adult medical examination without abnormal findings: Secondary | ICD-10-CM

## 2016-07-25 DIAGNOSIS — E785 Hyperlipidemia, unspecified: Secondary | ICD-10-CM

## 2016-07-25 LAB — CBC WITH DIFFERENTIAL/PLATELET
Basophils Absolute: 0 10*3/uL (ref 0.0–0.1)
Basophils Relative: 0.4 % (ref 0.0–3.0)
Eosinophils Absolute: 0.1 10*3/uL (ref 0.0–0.7)
Eosinophils Relative: 2.5 % (ref 0.0–5.0)
HCT: 40.2 % (ref 36.0–46.0)
Hemoglobin: 13.4 g/dL (ref 12.0–15.0)
Lymphocytes Relative: 34.1 % (ref 12.0–46.0)
Lymphs Abs: 1.7 10*3/uL (ref 0.7–4.0)
MCHC: 33.4 g/dL (ref 30.0–36.0)
MCV: 102.3 fl — ABNORMAL HIGH (ref 78.0–100.0)
Monocytes Absolute: 0.4 10*3/uL (ref 0.1–1.0)
Monocytes Relative: 9.1 % (ref 3.0–12.0)
Neutro Abs: 2.6 10*3/uL (ref 1.4–7.7)
Neutrophils Relative %: 53.9 % (ref 43.0–77.0)
Platelets: 193 10*3/uL (ref 150.0–400.0)
RBC: 3.93 Mil/uL (ref 3.87–5.11)
RDW: 13.7 % (ref 11.5–15.5)
WBC: 4.8 10*3/uL (ref 4.0–10.5)

## 2016-07-25 LAB — COMPREHENSIVE METABOLIC PANEL
ALT: 14 U/L (ref 0–35)
AST: 17 U/L (ref 0–37)
Albumin: 4.4 g/dL (ref 3.5–5.2)
Alkaline Phosphatase: 68 U/L (ref 39–117)
BUN: 14 mg/dL (ref 6–23)
CO2: 30 mEq/L (ref 19–32)
Calcium: 9.7 mg/dL (ref 8.4–10.5)
Chloride: 103 mEq/L (ref 96–112)
Creatinine, Ser: 0.75 mg/dL (ref 0.40–1.20)
GFR: 81.6 mL/min (ref >60.00)
Glucose, Bld: 93 mg/dL (ref 70–99)
Potassium: 4.6 mEq/L (ref 3.5–5.1)
Sodium: 141 mEq/L (ref 135–145)
Total Bilirubin: 0.7 mg/dL (ref 0.2–1.2)
Total Protein: 6.6 g/dL (ref 6.0–8.3)

## 2016-07-25 LAB — LIPID PANEL
Cholesterol: 277 mg/dL — ABNORMAL HIGH (ref 0–200)
HDL: 64.5 mg/dL (ref >39.00)
LDL Cholesterol: 173 mg/dL — ABNORMAL HIGH (ref 0–99)
NonHDL: 212.36
Total CHOL/HDL Ratio: 4
Triglycerides: 197 mg/dL — ABNORMAL HIGH (ref 0.0–149.0)
VLDL: 39.4 mg/dL (ref 0.0–40.0)

## 2016-07-25 LAB — TSH: TSH: 2.88 u[IU]/mL (ref 0.35–4.50)

## 2016-07-25 MED ORDER — TEMAZEPAM 15 MG PO CAPS: 15.0000 mg | ORAL_CAPSULE | Freq: Every evening | ORAL | 0 refills | Status: DC | PRN

## 2016-07-25 MED ORDER — CELECOXIB 200 MG PO CAPS: 200.0000 mg | ORAL_CAPSULE | Freq: Two times a day (BID) | ORAL | 2 refills | Status: DC | PRN

## 2016-07-25 NOTE — Progress Notes (Signed)
Subjective:    Patient ID: Shelly Bowers, female    DOB: [birth year], 66 y.o.   MRN: [id number]  HPI  66 year old patient who is seen today for an annual health assessment as well as Medicare wellness visit.  She has a history of mild dyslipidemia. Doing quite well today.  She does have a history of insomnia, but this has resolved.  She does have osteoarthritis and does use Celebrex  No past medical history on file.   Social History   Social History  . Marital status: Married    Spouse name: N/A  . Number of children: N/A  . Years of education: N/A   Occupational History  . Not on file.   Social History Main Topics  . Smoking status: Never Smoker  . Smokeless tobacco: Never Used  . Alcohol use Yes     Comment: occ  . Drug use: No  . Sexual activity: Not on file   Other Topics Concern  . Not on file   Social History Narrative  . No narrative on file    Past Surgical History:  Procedure Laterality Date  . INCISION AND DRAINAGE ABSCESS Left 12/27/2015   Procedure: INCISION AND DRAINAGE ABSCESS;  Surgeon: Shelly Brod, MD;  Location: [facility];  Service: Orthopedics;  Laterality: Left;    No family history on file.  Allergies  Allergen Reactions  . Codeine Nausea Only  . Codeine Sulfate     REACTION: unspecified  . Hydrocodone Nausea And Vomiting    Current Outpatient Prescriptions on File Prior to Visit  Medication Sig Dispense Refill  . ibuprofen (ADVIL,MOTRIN) 200 MG tablet Take 200 mg by mouth every 6 (six) hours as needed.       No current facility-administered medications on file prior to visit.     BP (!) 164/90 (BP Location: Left Arm, Patient Position: Sitting, Cuff Size: Normal)   Pulse 64   Temp 98 F (36.7 C) (Oral)   Ht 5' 5.5" (1.664 m)   Wt 180 lb 6.4 oz (81.8 kg)   SpO2 98%   BMI 29.56 kg/m   Medicare wellness visit  1. Risk factors, based on past  M,S,F history.  Cardio vascular risk factors include a history of  dyslipidemia, only  2.  Physical activities:remains quite active.  Does participated in yoga and [place]  3.  Depression/mood:no history of major depression or mood disorder  4.  Hearing:no deficits  5.  ADL's:independent  6.  Fall risk:low  7.  Home safety:no problems identified  8.  Height weight, and visual acuity;height and weight stable no change in visual acuity.    9.  Counseling:continue active lifestyle.  Encourage weight loss  10. Lab orders based on risk factors:laboratory profile will be reviewed, including lipid profile 11. Referral :not appropriate at this time  12. Care plan:continue efforts at aggressive risk factor modification  13. Cognitive assessment: alert and oriented with normal affect no cognitive dysfunction  14. Screening: Patient provided with a written and personalized 5-10 year screening schedule in the AVS.    15. Provider List Update: primary care radiology GI    Review of Systems  Constitutional: Negative.   HENT: Negative for congestion, dental problem, hearing loss, rhinorrhea, sinus pressure, sore throat and tinnitus.   Eyes: Negative for pain, discharge and visual disturbance.  Respiratory: Negative for cough and shortness of breath.   Cardiovascular: Negative for chest pain, palpitations and leg swelling.  Gastrointestinal: Negative for abdominal distention,  abdominal pain, blood in stool, constipation, diarrhea, nausea and vomiting.  Genitourinary: Negative for difficulty urinating, dysuria, flank pain, frequency, hematuria, pelvic pain, urgency, vaginal bleeding, vaginal discharge and vaginal pain.  Musculoskeletal: Negative for arthralgias, gait problem and joint swelling.  Skin: Negative for rash.  Neurological: Negative for dizziness, syncope, speech difficulty, weakness, numbness and headaches.  Hematological: Negative for adenopathy.  Psychiatric/Behavioral: Negative for agitation, behavioral problems and dysphoric mood.  The patient is not nervous/anxious.        Objective:   Physical Exam  Constitutional: She is oriented to person, place, and time. She appears well-developed and well-nourished.  HENT:  Head: Normocephalic and atraumatic.  Right Ear: External ear normal.  Left Ear: External ear normal.  Mouth/Throat: Oropharynx is clear and moist.  Eyes: Conjunctivae and EOM are normal.  Neck: Normal range of motion. Neck supple. No JVD present. No thyromegaly present.  Cardiovascular: Normal rate, regular rhythm, normal heart sounds and intact distal pulses.   No murmur heard. Pulmonary/Chest: Effort normal and breath sounds normal. She has no wheezes. She has no rales.  Abdominal: Soft. Bowel sounds are normal. She exhibits no distension and no mass. There is no tenderness. There is no rebound and no guarding.  Musculoskeletal: Normal range of motion. She exhibits no edema or tenderness.  Right ankle wrapped  Neurological: She is alert and oriented to person, place, and time. She has normal reflexes. No cranial nerve deficit. She exhibits normal muscle tone. Coordination normal.  Skin: Skin is warm and dry. No rash noted.  Psychiatric: She has a normal mood and affect. Her behavior is normal.          Assessment & Plan:   Preventive health examination.  Last colonoscopy 2007; Cologuard 2017.  Consider repeat 1 year Medicare wellness visit Dyslipidemia.  We'll review a lipid profile Exogenous obesity.  Weight loss encouraged  Follow-up one year or as needed  Shelly Bowers

## 2016-07-25 NOTE — Patient Instructions (Addendum)
Limit your sodium (Salt) intake    It is important that you exercise regularly, at least 20 minutes 3 to 4 times per week.  If you develop chest pain or shortness of breath seek  medical attention.  You need to lose weight.  Consider a lower calorie diet and regular exercise.   Health Maintenance for Postmenopausal Women Menopause is a normal process in which your reproductive ability comes to an end. This process happens gradually over a span of months to years, usually between the ages of 46 and 45. Menopause is complete when you have missed 12 consecutive menstrual periods. It is important to talk with your health care provider about some of the most common conditions that affect postmenopausal women, such as heart disease, cancer, and bone loss (osteoporosis). Adopting a healthy lifestyle and getting preventive care can help to promote your health and wellness. Those actions can also lower your chances of developing some of these common conditions. What should I know about menopause? During menopause, you may experience a number of symptoms, such as:  Moderate-to-severe hot flashes.  Night sweats.  Decrease in sex drive.  Mood swings.  Headaches.  Tiredness.  Irritability.  Memory problems.  Insomnia. Choosing to treat or not to treat menopausal changes is an individual decision that you make with your health care provider. What should I know about hormone replacement therapy and supplements? Hormone therapy products are effective for treating symptoms that are associated with menopause, such as hot flashes and night sweats. Hormone replacement carries certain risks, especially as you become older. If you are thinking about using estrogen or estrogen with progestin treatments, discuss the benefits and risks with your health care provider. What should I know about heart disease and stroke? Heart disease, heart attack, and stroke become more likely as you age. This may be due, in  part, to the hormonal changes that your body experiences during menopause. These can affect how your body processes dietary fats, triglycerides, and cholesterol. Heart attack and stroke are both medical emergencies. There are many things that you can do to help prevent heart disease and stroke:  Have your blood pressure checked at least every 1-2 years. High blood pressure causes heart disease and increases the risk of stroke.  If you are 90-18 years old, ask your health care provider if you should take aspirin to prevent a heart attack or a stroke.  Do not use any tobacco products, including cigarettes, chewing tobacco, or electronic cigarettes. If you need help quitting, ask your health care provider.  It is important to eat a healthy diet and maintain a healthy weight.  Be sure to include plenty of vegetables, fruits, low-fat dairy products, and lean protein.  Avoid eating foods that are high in solid fats, added sugars, or salt (sodium).  Get regular exercise. This is one of the most important things that you can do for your health.  Try to exercise for at least 150 minutes each week. The type of exercise that you do should increase your heart rate and make you sweat. This is known as moderate-intensity exercise.  Try to do strengthening exercises at least twice each week. Do these in addition to the moderate-intensity exercise.  Know your numbers.Ask your health care provider to check your cholesterol and your blood glucose. Continue to have your blood tested as directed by your health care provider. What should I know about cancer screening? There are several types of cancer. Take the following steps to reduce your  risk and to catch any cancer development as early as possible. Breast Cancer  Practice breast self-awareness.  This means understanding how your breasts normally appear and feel.  It also means doing regular breast self-exams. Let your health care provider know about  any changes, no matter how small.  If you are 59 or older, have a clinician do a breast exam (clinical breast exam or CBE) every year. Depending on your age, family history, and medical history, it may be recommended that you also have a yearly breast X-ray (mammogram).  If you have a family history of breast cancer, talk with your health care provider about genetic screening.  If you are at high risk for breast cancer, talk with your health care provider about having an MRI and a mammogram every year.  Breast cancer (BRCA) gene test is recommended for women who have family members with BRCA-related cancers. Results of the assessment will determine the need for genetic counseling and BRCA1 and for BRCA2 testing. BRCA-related cancers include these types:  Breast. This occurs in males or females.  Ovarian.  Tubal. This may also be called fallopian tube cancer.  Cancer of the abdominal or pelvic lining (peritoneal cancer).  Prostate.  Pancreatic. Cervical, Uterine, and Ovarian Cancer  Your health care provider may recommend that you be screened regularly for cancer of the pelvic organs. These include your ovaries, uterus, and vagina. This screening involves a pelvic exam, which includes checking for microscopic changes to the surface of your cervix (Pap test).  For women ages 21-65, health care providers may recommend a pelvic exam and a Pap test every three years. For women ages 53-65, they may recommend the Pap test and pelvic exam, combined with testing for human papilloma virus (HPV), every five years. Some types of HPV increase your risk of cervical cancer. Testing for HPV may also be done on women of any age who have unclear Pap test results.  Other health care providers may not recommend any screening for nonpregnant women who are considered low risk for pelvic cancer and have no symptoms. Ask your health care provider if a screening pelvic exam is right for you.  If you have had past  treatment for cervical cancer or a condition that could lead to cancer, you need Pap tests and screening for cancer for at least 20 years after your treatment. If Pap tests have been discontinued for you, your risk factors (such as having a new sexual partner) need to be reassessed to determine if you should start having screenings again. Some women have medical problems that increase the chance of getting cervical cancer. In these cases, your health care provider may recommend that you have screening and Pap tests more often.  If you have a family history of uterine cancer or ovarian cancer, talk with your health care provider about genetic screening.  If you have vaginal bleeding after reaching menopause, tell your health care provider.  There are currently no reliable tests available to screen for ovarian cancer. Lung Cancer  Lung cancer screening is recommended for adults 28-37 years old who are at high risk for lung cancer because of a history of smoking. A yearly low-dose CT scan of the lungs is recommended if you:  Currently smoke.  Have a history of at least 30 pack-years of smoking and you currently smoke or have quit within the past 15 years. A pack-year is smoking an average of one pack of cigarettes per day for one year. Yearly screening  should:  Continue until it has been 15 years since you quit.  Stop if you develop a health problem that would prevent you from having lung cancer treatment. Colorectal Cancer  This type of cancer can be detected and can often be prevented.  Routine colorectal cancer screening usually begins at age 40 and continues through age 75.  If you have risk factors for colon cancer, your health care provider may recommend that you be screened at an earlier age.  If you have a family history of colorectal cancer, talk with your health care provider about genetic screening.  Your health care provider may also recommend using home test kits to check for  hidden blood in your stool.  A small camera at the end of a tube can be used to examine your colon directly (sigmoidoscopy or colonoscopy). This is done to check for the earliest forms of colorectal cancer.  Direct examination of the colon should be repeated every 5-10 years until age 30. However, if early forms of precancerous polyps or small growths are found or if you have a family history or genetic risk for colorectal cancer, you may need to be screened more often. Skin Cancer  Check your skin from head to toe regularly.  Monitor any moles. Be sure to tell your health care provider:  About any new moles or changes in moles, especially if there is a change in a mole's shape or color.  If you have a mole that is larger than the size of a pencil eraser.  If any of your family members has a history of skin cancer, especially at a young age, talk with your health care provider about genetic screening.  Always use sunscreen. Apply sunscreen liberally and repeatedly throughout the day.  Whenever you are outside, protect yourself by wearing long sleeves, pants, a wide-brimmed hat, and sunglasses. What should I know about osteoporosis? Osteoporosis is a condition in which bone destruction happens more quickly than new bone creation. After menopause, you may be at an increased risk for osteoporosis. To help prevent osteoporosis or the bone fractures that can happen because of osteoporosis, the following is recommended:  If you are 2-43 years old, get at least 1,000 mg of calcium and at least 600 mg of vitamin D per day.  If you are older than age 48 but younger than age 73, get at least 1,200 mg of calcium and at least 600 mg of vitamin D per day.  If you are older than age 35, get at least 1,200 mg of calcium and at least 800 mg of vitamin D per day. Smoking and excessive alcohol intake increase the risk of osteoporosis. Eat foods that are rich in calcium and vitamin D, and do weight-bearing  exercises several times each week as directed by your health care provider. What should I know about how menopause affects my mental health? Depression may occur at any age, but it is more common as you become older. Common symptoms of depression include:  Low or sad mood.  Changes in sleep patterns.  Changes in appetite or eating patterns.  Feeling an overall lack of motivation or enjoyment of activities that you previously enjoyed.  Frequent crying spells. Talk with your health care provider if you think that you are experiencing depression. What should I know about immunizations? It is important that you get and maintain your immunizations. These include:  Tetanus, diphtheria, and pertussis (Tdap) booster vaccine.  Influenza every year before the flu season begins.  Pneumonia vaccine.  Shingles vaccine. Your health care provider may also recommend other immunizations. This information is not intended to replace advice given to you by your health care provider. Make sure you discuss any questions you have with your health care provider. Document Released: 05/04/2005 Document Revised: 09/30/2015 Document Reviewed: 12/14/2014 Elsevier Interactive Patient Education  2017 [employer].

## 2016-07-25 NOTE — Progress Notes (Signed)
Pre visit review using our clinic review tool, if applicable. No additional management support is needed unless otherwise documented below in the visit note. 

## 2016-08-06 ENCOUNTER — Ambulatory Visit: Payer: Medicare HMO | Admitting: Podiatry

## 2016-08-08 ENCOUNTER — Ambulatory Visit: Payer: Medicare HMO

## 2016-08-17 ENCOUNTER — Ambulatory Visit
Admission: RE | Admit: 2016-08-17 | Discharge: 2016-08-17 | Disposition: A | Discharge status: Home / Self Care | Payer: Medicare HMO | Source: Ambulatory Visit | Attending: Obstetrics & Gynecology | Admitting: Obstetrics & Gynecology

## 2016-08-17 DIAGNOSIS — Z1231 Encounter for screening mammogram for malignant neoplasm of breast: Secondary | ICD-10-CM | POA: Diagnosis not present

## 2016-09-17 DIAGNOSIS — R69 Illness, unspecified: Secondary | ICD-10-CM | POA: Diagnosis not present

## 2016-11-29 ENCOUNTER — Telehealth: Payer: Self-pay

## 2016-11-29 MED ORDER — MECLIZINE HCL 25 MG PO TABS: 25.0000 mg | ORAL_TABLET | Freq: Three times a day (TID) | ORAL | 0 refills | Status: DC | PRN

## 2016-11-29 NOTE — Telephone Encounter (Signed)
Please call in some meclizine 25 mg #30.  Ask patient take 3 times daily. Office visit tomorrow as scheduled

## 2016-11-29 NOTE — Telephone Encounter (Signed)
Spoke with pt and advised. She will pick up rx tonight and start. She will keep appt tomorrow. Nothing further needed at this time.

## 2016-11-29 NOTE — Telephone Encounter (Signed)
Patient called to report that for several days she has experienced increasing bouts of vertigo. They started at night while she was sleeping, she would wake slightly to turn over in bed and feel very lightheaded and like the room was spinning. She states that it would self-resolve. She has now noticed that even bending over to do things causes the same vertigo symptoms. Today she had bent over twice and had the same symptoms, the second time she also developed sweats and some nausea. She took her blood pressure immediately after second event and it was 171/83 bilateral. She did schedule appt with Dr. Maudie Mercury tomorrow, but wanted to know what you would recommend.   Dr. Raliegh Ip - Please advise. Thanks!

## 2016-11-30 ENCOUNTER — Encounter: Payer: Self-pay | Admitting: Family Medicine

## 2016-11-30 ENCOUNTER — Ambulatory Visit (INDEPENDENT_AMBULATORY_CARE_PROVIDER_SITE_OTHER): Payer: Medicare HMO | Admitting: Family Medicine

## 2016-11-30 VITALS — BP 128/74 | HR 61 | Temp 98.1°F | Ht 65.5 in | Wt 176.8 lb

## 2016-11-30 DIAGNOSIS — R030 Elevated blood-pressure reading, without diagnosis of hypertension: Secondary | ICD-10-CM

## 2016-11-30 DIAGNOSIS — R42 Dizziness and giddiness: Secondary | ICD-10-CM | POA: Diagnosis not present

## 2016-11-30 NOTE — Patient Instructions (Signed)
BEFORE YOU LEAVE: -follow up: 1 month with PCP  -We placed a referral for you as discussed for the vestibular rehab for vertigo. It usually takes about 1-2 weeks to process and schedule this referral. If you have not heard from [place] regarding this appointment in 2 weeks please contact our office.  -use the meclizine only as needed and do not use with other sedating medications  -cut back on alcohol use  -do not drive with vertigo  I am glad you are feeling better! Seek care immediately if worsening, new concerns or you are not improving with treatment.  Dizziness Dizziness is a common problem. It makes you feel unsteady or light-headed. You may feel like you are about to pass out (faint). Dizziness can lead to getting hurt if you stumble or fall. Dizziness can be caused by many things, including:  Medicines.  Not having enough water in your body (dehydration).  Illness.  Follow these instructions at home: Eating and drinking  Drink enough fluid to keep your pee (urine) clear or pale yellow. This helps to keep you from getting dehydrated. Try to drink more clear fluids, such as water.  Do not drink alcohol.  Limit how much caffeine you drink or eat, if your doctor tells you to do that.  Limit how much salt (sodium) you drink or eat, if your doctor tells you to do that. Activity  Avoid making quick movements. ? When you stand up from sitting in a chair, steady yourself until you feel okay. ? In the morning, first sit up on the side of the bed. When you feel okay, stand slowly while you hold onto something. Do this until you know that your balance is fine.  If you need to stand in one place for a long time, move your legs often. Tighten and relax the muscles in your legs while you are standing.  Do not drive or use heavy machinery if you feel dizzy.  Avoid bending down if you feel dizzy. Place items in your home so you can reach them easily without leaning over. Lifestyle  Do  not use any products that contain nicotine or tobacco, such as cigarettes and e-cigarettes. If you need help quitting, ask your doctor.  Try to lower your stress level. You can do this by using methods such as yoga or meditation. Talk with your doctor if you need help. General instructions  Watch your dizziness for any changes.  Take over-the-counter and prescription medicines only as told by your doctor. Talk with your doctor if you think that you are dizzy because of a medicine that you are taking.  Tell a friend or a family member that you are feeling dizzy. If he or she notices any changes in your behavior, have this person call your doctor.  Keep all follow-up visits as told by your doctor. This is important. Contact a doctor if:  Your dizziness does not go away.  Your dizziness or light-headedness gets worse.  You feel sick to your stomach (nauseous).  You have trouble hearing.  You have new symptoms.  You are unsteady on your feet.  You feel like the room is spinning. Get help right away if:  You throw up (vomit) or have watery poop (diarrhea), and you cannot eat or drink anything.  You have trouble: ? Talking. ? Walking. ? Swallowing. ? Using your arms, hands, or legs.  You feel generally weak.  You are not thinking clearly, or you have trouble forming sentences. A  friend or family member may notice this.  You have: ? Chest pain. ? Pain in your belly (abdomen). ? Shortness of breath. ? Sweating.  Your vision changes.  You are bleeding.  You have a very bad headache.  You have neck pain or a stiff neck.  You have a fever. These symptoms may be an emergency. Do not wait to see if the symptoms will go away. Get medical help right away. Call your local emergency services (911 in the [place] Do not drive yourself to the hospital. Summary  Dizziness makes you feel unsteady or light-headed. You may feel like you are about to pass out (faint).  Drink enough  fluid to keep your pee (urine) clear or pale yellow. Do not drink alcohol.  Avoid making quick movements if you feel dizzy.  Watch your dizziness for any changes. This information is not intended to replace advice given to you by your health care provider. Make sure you discuss any questions you have with your health care provider. Document Released: 03/01/2011 Document Revised: 03/29/2016 Document Reviewed: 03/29/2016 Elsevier Interactive Patient Education  2017 [employer].

## 2016-11-30 NOTE — Progress Notes (Signed)
HPI:  Acute visit for vertigo: -intermittent symptoms for 2-3 weeks -Symptoms include brief episodes of vertigo, sometimes accompanied by nausea that are triggered by certain movements - turning the head in bed or bending down while turning her head and -PCP called in meclizine, she has felt better for the last 2 days -She had a bad episode a few days ago and became quite anxious when she checked her blood pressure it was a bit elevated -Denies: fevers, headache, vision changes, weakness, numbness, slurred speech, change in speech, neck pain, falls or syncope, chest pain, shortness of breath -History of palpitations, unchanged  ROS: See pertinent positives and negatives per HPI.  No past medical history on file.  Past Surgical History:  Procedure Laterality Date  . BREAST EXCISIONAL BIOPSY    . INCISION AND DRAINAGE ABSCESS Left 12/27/2015   Procedure: INCISION AND DRAINAGE ABSCESS;  Surgeon: Daryll Brod, MD;  Location: [facility];  Service: Orthopedics;  Laterality: Left;    No family history on file.  Social History   Social History  . Marital status: Married    Spouse name: N/A  . Number of children: N/A  . Years of education: N/A   Social History Main Topics  . Smoking status: Never Smoker  . Smokeless tobacco: Never Used  . Alcohol use Yes     Comment: occ  . Drug use: No  . Sexual activity: Not Asked   Other Topics Concern  . None   Social History Narrative  . None     Current Outpatient Prescriptions:  .  celecoxib (CELEBREX) 200 MG capsule, Take 1 capsule (200 mg total) by mouth 2 (two) times daily as needed., Disp: 90 capsule, Rfl: 2 .  ibuprofen (ADVIL,MOTRIN) 200 MG tablet, Take 200 mg by mouth every 6 (six) hours as needed.  , Disp: , Rfl:  .  meclizine (ANTIVERT) 25 MG tablet, Take 1 tablet (25 mg total) by mouth 3 (three) times daily as needed for dizziness., Disp: 30 tablet, Rfl: 0 .  temazepam (RESTORIL) 15 MG capsule, Take 1 capsule  (15 mg total) by mouth at bedtime as needed for sleep., Disp: 30 capsule, Rfl: 0  EXAM:  Vitals:   11/30/16 1333  BP: 128/74  Pulse: 61  Temp: 98.1 F (36.7 C)    Body mass index is 28.97 kg/m.  GENERAL: vitals reviewed and listed above, alert, oriented, appears well hydrated and in no acute distress  HEENT: atraumatic, conjunttiva clear, no obvious abnormalities on inspection of external nose and ears, normal exam of the ear canals and TMs, inspection of the oropharynx unrevealing  NECK: no obvious masses on inspection, no carotid bruits  LUNGS: clear to auscultation bilaterally, no wheezes, rales or rhonchi, good air movement  CV: HRRR, no peripheral edema  MS: moves all extremities without noticeable abnormality  PSYCH/NEURO: pleasant and cooperative, no obvious depression or anxiety, cranial nerves II through XII grossly intact, finger to nose normal, Marye Round is mildly positive to the left  ASSESSMENT AND PLAN:  Discussed the following assessment and plan:  Vertigo - Plan: Ambulatory referral to Physical Therapy  Elevated BP without diagnosis of hypertension  -we discussed possible serious and likely etiologies, workup and treatment, treatment risks and return precautions - symptoms classic for positional vertigo -after this discussion, Shelly Bowers opted for vestibular rehabilitation, meclizine as needed, no driving with vertigo -Blood pressure looks pretty good today on recheck -follow up advised 1 month to recheck blood pressure and follow up -of  course, we advised Shelly Bowers  to return or notify a doctor immediately if symptoms worsen or persist or new concerns arise.   Patient Instructions  BEFORE YOU LEAVE: -follow up: 1 month with PCP  -We placed a referral for you as discussed for the vestibular rehab for vertigo. It usually takes about 1-2 weeks to process and schedule this referral. If you have not heard from [place] regarding this appointment in 2 weeks please  contact our office.  -use the meclizine only as needed and do not use with other sedating medications  -cut back on alcohol use  -do not drive with vertigo  I am glad you are feeling better! Seek care immediately if worsening, new concerns or you are not improving with treatment.  Dizziness Dizziness is a common problem. It makes you feel unsteady or light-headed. You may feel like you are about to pass out (faint). Dizziness can lead to getting hurt if you stumble or fall. Dizziness can be caused by many things, including:  Medicines.  Not having enough water in your body (dehydration).  Illness.  Follow these instructions at home: Eating and drinking  Drink enough fluid to keep your pee (urine) clear or pale yellow. This helps to keep you from getting dehydrated. Try to drink more clear fluids, such as water.  Do not drink alcohol.  Limit how much caffeine you drink or eat, if your doctor tells you to do that.  Limit how much salt (sodium) you drink or eat, if your doctor tells you to do that. Activity  Avoid making quick movements. ? When you stand up from sitting in a chair, steady yourself until you feel okay. ? In the morning, first sit up on the side of the bed. When you feel okay, stand slowly while you hold onto something. Do this until you know that your balance is fine.  If you need to stand in one place for a long time, move your legs often. Tighten and relax the muscles in your legs while you are standing.  Do not drive or use heavy machinery if you feel dizzy.  Avoid bending down if you feel dizzy. Place items in your home so you can reach them easily without leaning over. Lifestyle  Do not use any products that contain nicotine or tobacco, such as cigarettes and e-cigarettes. If you need help quitting, ask your doctor.  Try to lower your stress level. You can do this by using methods such as yoga or meditation. Talk with your doctor if you need  help. General instructions  Watch your dizziness for any changes.  Take over-the-counter and prescription medicines only as told by your doctor. Talk with your doctor if you think that you are dizzy because of a medicine that you are taking.  Tell a friend or a family member that you are feeling dizzy. If he or she notices any changes in your behavior, have this person call your doctor.  Keep all follow-up visits as told by your doctor. This is important. Contact a doctor if:  Your dizziness does not go away.  Your dizziness or light-headedness gets worse.  You feel sick to your stomach (nauseous).  You have trouble hearing.  You have new symptoms.  You are unsteady on your feet.  You feel like the room is spinning. Get help right away if:  You throw up (vomit) or have watery poop (diarrhea), and you cannot eat or drink anything.  You have trouble: ? Talking. ? Walking. ?  Swallowing. ? Using your arms, hands, or legs.  You feel generally weak.  You are not thinking clearly, or you have trouble forming sentences. A friend or family member may notice this.  You have: ? Chest pain. ? Pain in your belly (abdomen). ? Shortness of breath. ? Sweating.  Your vision changes.  You are bleeding.  You have a very bad headache.  You have neck pain or a stiff neck.  You have a fever. These symptoms may be an emergency. Do not wait to see if the symptoms will go away. Get medical help right away. Call your local emergency services (911 in the [place] Do not drive yourself to the hospital. Summary  Dizziness makes you feel unsteady or light-headed. You may feel like you are about to pass out (faint).  Drink enough fluid to keep your pee (urine) clear or pale yellow. Do not drink alcohol.  Avoid making quick movements if you feel dizzy.  Watch your dizziness for any changes. This information is not intended to replace advice given to you by your health care provider.  Make sure you discuss any questions you have with your health care provider. Document Released: 03/01/2011 Document Revised: 03/29/2016 Document Reviewed: 03/29/2016 Elsevier Interactive Patient Education  2017 [employer]., DO

## 2016-12-13 ENCOUNTER — Encounter: Payer: Self-pay | Admitting: Internal Medicine

## 2016-12-26 ENCOUNTER — Telehealth: Payer: Self-pay | Admitting: *Deleted

## 2016-12-26 DIAGNOSIS — Z87312 Personal history of (healed) stress fracture: Secondary | ICD-10-CM

## 2016-12-26 NOTE — Telephone Encounter (Signed)
Judson Roch called from [facility] wanting to follow up if the patient has had a bone denisty test that was to be done by 01/14/17. Judson Roch states she has sent over a fax. Please advise Judson Roch [phone]

## 2017-01-01 ENCOUNTER — Ambulatory Visit: Payer: Medicare HMO | Admitting: Internal Medicine

## 2017-01-02 NOTE — Telephone Encounter (Signed)
Called pt and left a detailed message stating that a bone density scan order was placed and for her to please schedule the scan.

## 2017-01-02 NOTE — Addendum Note (Signed)
Addended by: Abelardo Diesel on: 01/02/2017 09:01 AM   Modules accepted: Orders

## 2017-01-10 ENCOUNTER — Ambulatory Visit (INDEPENDENT_AMBULATORY_CARE_PROVIDER_SITE_OTHER)
Admission: RE | Admit: 2017-01-10 | Discharge: 2017-01-10 | Disposition: A | Discharge status: Home / Self Care | Payer: Medicare HMO | Source: Ambulatory Visit | Attending: Internal Medicine | Admitting: Internal Medicine

## 2017-01-10 DIAGNOSIS — Z87312 Personal history of (healed) stress fracture: Secondary | ICD-10-CM

## 2017-01-11 ENCOUNTER — Ambulatory Visit: Payer: Medicare HMO | Attending: Family Medicine

## 2017-01-11 DIAGNOSIS — R2689 Other abnormalities of gait and mobility: Secondary | ICD-10-CM | POA: Diagnosis present

## 2017-01-11 DIAGNOSIS — R42 Dizziness and giddiness: Secondary | ICD-10-CM | POA: Diagnosis present

## 2017-01-11 DIAGNOSIS — H8113 Benign paroxysmal vertigo, bilateral: Secondary | ICD-10-CM | POA: Insufficient documentation

## 2017-01-11 NOTE — Therapy (Signed)
[facility] [address] [place], [place], [address] Phone: [phone]   Fax:  [phone]  Physical Therapy Evaluation  Patient Details  Name: Shelly Bowers MRN: [id number] Date of Birth: 1950-04-10 Referring Provider: Dr. Maudie Mercury  Encounter Date: 01/11/2017      PT End of Session - 01/11/17 1011    Visit Number 1   Number of Visits 4   Date for PT Re-Evaluation 02/10/17   Authorization Type [employer] Medicare: no auth per Lattie Haw. G-CODE AND PN EVERY 10TH VISIT.    PT Start Time 0845   PT Stop Time 0929   PT Time Calculation (min) 44 min   Activity Tolerance Patient tolerated treatment well   Behavior During Therapy [facility] for tasks assessed/performed      No past medical history on file.  Past Surgical History:  Procedure Laterality Date  . BREAST EXCISIONAL BIOPSY    . INCISION AND DRAINAGE ABSCESS Left 12/27/2015   Procedure: INCISION AND DRAINAGE ABSCESS;  Surgeon: Daryll Brod, MD;  Location: [facility];  Service: Orthopedics;  Laterality: Left;    There were no vitals filed for this visit.       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 Pt leaned forward and she felt like she was going to pass out but it subsided with a seated rest break. Pt went to MD on 11/30/16 and was given Meclizine, which reduced dizziness. She hasn't taken Meclizine since 11/30/16. Pt's BP was elevated during vertigo episodes, and per pt Dr. Maudie Mercury stated elevated BP was normal during vertigo episode. Prior to onset of vertigo pt woke up with dizziness when turning in bed for 2-3 nights, and one episode of tripping over a rug. Pt still has slight dizziness when turning in bed, but severity has decreased. Pt performs yoga with less dizziness but is slower while performing position changes 2/2 fear of provoking dizziness. Now at its worst  dizziness is 2-3/10 and 0/10 at best. Pt denied illness, falls, accident prior to onset of dizziness. Pt denied HA or pain with vertigo.   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 No/denies            [facility] PT Assessment - 01/11/17 0859      Assessment   Medical Diagnosis Vertigo   Referring Provider Dr. Maudie Mercury   Onset Date/Surgical Date 11/29/16   Hand Dominance Left   Prior Therapy None     Precautions   Precautions None     Restrictions   Weight Bearing Restrictions No     Balance Screen   Has the patient fallen in the past 6 months No   Has the patient had a decrease in activity level because of a fear of falling?  No   Is the patient reluctant to leave their home because of a fear of falling?  No     Home Ecologist residence   Living Arrangements Spouse/significant other   Available Help at Discharge Family   Type of [place] to enter   Entrance Stairs-Number of Steps 2   Entrance Stairs-Rails None   Home Layout One level   [place] bars - tub/shower  Prior Function   Level of Independence Independent   Vocation Retired   Leisure Yoga, [place], stay active, stretch class at the [employer]   Overall Cognitive Status Within Functional Limits for tasks assessed     Observation/Other Assessments   Focus on Therapeutic Outcomes (FOTO)  DHI: 20%: indicates pt perceives dizziness to have a mild impact on functional abilities.      Sensation   Additional Comments Pt reports intermittent L hand N/T. PT encouraged pt to notify MD.      Ambulation/Gait   Ambulation/Gait Yes   Ambulation/Gait Assistance 7: Independent   Ambulation Distance (Feet) 100 Feet   Assistive device None   Gait Pattern Within Functional Limits;Step-through pattern   Ambulation Surface  Level;Indoor   Gait velocity 3.24ft/sec.            Vestibular Assessment - 01/11/17 0904      Vestibular Assessment   General Observation Pt has progressive lenses in glasses.     Symptom Behavior   Type of Dizziness Comment  wooziness and spinning   Frequency of Dizziness Daily   Duration of Dizziness 5-10 sec.   Aggravating Factors Supine to sit;Rolling to left;Lying supine;Rolling to right  L>R rolling   Relieving Factors Rest     Occulomotor Exam   Occulomotor Alignment Normal   Spontaneous Absent   Gaze-induced Absent   Smooth Pursuits Intact   Saccades Intact   Comment Pt denied dizziness. B HIT (-)     Vestibulo-Occular Reflex   VOR 1 Head Only (x 1 viewing) WFL with pt reporting "slight wooziness".      Positional Testing   Dix-Hallpike Dix-Hallpike Right;Dix-Hallpike Left   Horizontal Canal Testing Horizontal Canal Right;Horizontal Canal Left     Dix-Hallpike Right   Dix-Hallpike Right Duration 5/10 for < 10sec. with nystagmus lasting approx. 30 sec.   Dix-Hallpike Right Symptoms Upbeat, right rotatory nystagmus     Dix-Hallpike Left   Dix-Hallpike Left Duration none   Dix-Hallpike Left Symptoms No nystagmus     Horizontal Canal Right   Horizontal Canal Right Duration none   Horizontal Canal Right Symptoms Normal     Horizontal Canal Left   Horizontal Canal Left Duration none   Horizontal Canal Left Symptoms Normal     Positional Sensitivities   Supine to Sitting Lightheadedness        Objective measurements completed on examination: See above findings.           Vestibular Treatment/Exercise - 01/11/17 0923      Vestibular Treatment/Exercise   Vestibular Treatment Provided Canalith Repositioning   Canalith Repositioning Epley Manuever Right      EPLEY MANUEVER RIGHT   Number of Reps  1   Overall Response Symptoms Resolved   Response Details  Pt denied dizziness and no nystagmus noted.                PT Education -  01/11/17 1011    Education provided Yes   Education Details PT discussed exam findings, BPPV, PT POC/duration/frequency.   Person(s) Educated Patient   Methods Explanation   Comprehension Verbalized understanding          PT Short Term Goals - 01/11/17 1015      PT SHORT TERM GOAL #1   Title same as LTGs           PT Long Term Goals - 01/11/17 1015      PT LONG TERM GOAL #1  Title Pt will report 0/10 dizziness during all positional testing to improve safety during ADLs. TARGET DATE FOR ALL LTGS: 02/08/17   Status New     PT LONG TERM GOAL #2   Title Perform FGA and write goal as indicated.    Status New     PT LONG TERM GOAL #3   Title Pt will report full return to all exercise activities without dizziness (pt has not tried Zumba since onset of dizziness), to improve quality of life.    Status New     PT LONG TERM GOAL #4   Title Pt will improve DHI score from 20% to 18% to improve quality of life.    Status New                Plan - 01/11/17 1012    Clinical Impression Statement Pt is a pleasant 66y/o female presenting to OPPT neuro for vertigo. 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 Pt experience R torsional upbeating nystagmus and dizziness during R Dix-Hallpike, which all resolved after R Epley treatment. All other canals negative during positional testing, however, PT will monitor as pt reported MD told pt L side was the affected side in 11/2016. Dizziness was the only impairment noted during exam, PT will monitor balance and assess for other deficits as indicated, as pt reported she tripped over a rug at night but is now back to exercising without issues.  Pt would benefit from skilled PT to improve safety during functional mobility.    History and Personal Factors relevant to plan of care: Pt is active, performs yoga and goes to fitness classes and dance class.   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 Provide VOR for HEP.   Consulted and Agree with Plan of Care Patient      Patient will benefit from skilled therapeutic intervention in order to improve the following deficits and impairments:  Abnormal gait, Dizziness, Decreased balance  Visit Diagnosis: BPPV (benign paroxysmal positional vertigo), bilateral - Plan: PT plan of care cert/re-cert  Other abnormalities of gait and mobility - Plan: PT plan of care cert/re-cert  Dizziness and giddiness - Plan: PT plan of care cert/re-cert      G-Codes - 16/60/63 1017    Functional Assessment Tool Used (Outpatient Only) DHI: 20%   Functional Limitation Self care   Self Care Current Status (K1601) At least 1 percent but less than 20 percent impaired, limited or restricted   Self Care Goal Status (U9323) 0 percent impaired, limited or restricted       Problem List Patient Active Problem List   Diagnosis Date Noted  . Dyslipidemia 04/20/2013  . Encounter for preventive health examination 03/15/2011  . HIP PAIN, LEFT 10/28/2008    Dreanna Kyllo L 01/11/2017, 10:18 AM  [facility] [address] [place], [place], [address] Phone: [phone]   Fax:  [phone]  Name: Shelly Bowers  MRN:  [id number] Date of Birth: March 17, 1951   Geoffry Paradise, PT,DPT 01/11/17 10:19 AM Phone: [phone] Fax: [phone]

## 2017-01-23 ENCOUNTER — Ambulatory Visit: Payer: Medicare HMO

## 2017-02-06 ENCOUNTER — Ambulatory Visit (INDEPENDENT_AMBULATORY_CARE_PROVIDER_SITE_OTHER): Payer: Medicare HMO

## 2017-02-06 DIAGNOSIS — Z23 Encounter for immunization: Secondary | ICD-10-CM | POA: Diagnosis not present

## 2017-03-13 NOTE — Therapy (Signed)
[facility] [address] [place], [place], [address] Phone: [phone]   Fax:  [phone]  Patient Details  Name: Shelly Bowers MRN: [id number] Date of Birth: 1951-02-26 Referring Provider:  No ref. provider found  Encounter Date: 04/10/17   PHYSICAL THERAPY DISCHARGE SUMMARY  Visits from Start of Care: 1  Current functional level related to goals / functional outcomes: PT Short Term Goals - 01/11/17 1015      PT SHORT TERM GOAL #1   Title  same as LTGs      PT Long Term Goals - 01/11/17 1015      PT LONG TERM GOAL #1   Title  Pt will report 0/10 dizziness during all positional testing to improve safety during ADLs. TARGET DATE FOR ALL LTGS: 02/08/17    Status  New      PT LONG TERM GOAL #2   Title  Perform FGA and write goal as indicated.     Status  New      PT LONG TERM GOAL #3   Title  Pt will report full return to all exercise activities without dizziness (pt has not tried Zumba since onset of dizziness), to improve quality of life.     Status  New      PT LONG TERM GOAL #4   Title  Pt will improve DHI score from 20% to 18% to improve quality of life.     Status  New         Remaining deficits: Unknown, as pt did not return after eval.   Education / Equipment: PT POC, frequency, and duration.  Plan: Patient agrees to discharge.  Patient goals were not met. Patient is being discharged due to meeting the stated rehab goals.  ?????        G-Codes - 04-10-2017 1426    Functional Assessment Tool Used (Outpatient Only)  DHI: 20%-same as eval as pt did not come back    Functional Limitation  Self care    Self Care Goal Status ([id number])  0 percent impaired, limited or restricted    Self Care Discharge Status [phone])  At least 1 percent but less than 20 percent impaired, limited or restricted        Rilea Arutyunyan L 04-10-2017, 2:28 PM  [facility] [address] [place], [place], [address] Phone: [phone]   Fax:  [phone]   Geoffry Paradise, PT,DPT 2017-04-10 2:29 PM Phone: [phone] Fax: [phone]

## 2017-04-01 DIAGNOSIS — R69 Illness, unspecified: Secondary | ICD-10-CM | POA: Diagnosis not present

## 2017-04-30 ENCOUNTER — Other Ambulatory Visit: Payer: Self-pay | Admitting: Internal Medicine

## 2017-04-30 DIAGNOSIS — R69 Illness, unspecified: Secondary | ICD-10-CM | POA: Diagnosis not present

## 2017-07-15 ENCOUNTER — Other Ambulatory Visit: Payer: Self-pay | Admitting: Obstetrics & Gynecology

## 2017-07-15 DIAGNOSIS — Z1231 Encounter for screening mammogram for malignant neoplasm of breast: Secondary | ICD-10-CM

## 2017-08-13 DIAGNOSIS — Z6828 Body mass index (BMI) 28.0-28.9, adult: Secondary | ICD-10-CM | POA: Diagnosis not present

## 2017-08-13 DIAGNOSIS — Z01419 Encounter for gynecological examination (general) (routine) without abnormal findings: Secondary | ICD-10-CM | POA: Diagnosis not present

## 2017-08-13 DIAGNOSIS — Z124 Encounter for screening for malignant neoplasm of cervix: Secondary | ICD-10-CM | POA: Diagnosis not present

## 2017-08-13 LAB — HM PAP SMEAR

## 2017-08-20 ENCOUNTER — Ambulatory Visit
Admission: RE | Admit: 2017-08-20 | Discharge: 2017-08-20 | Disposition: A | Discharge status: Home / Self Care | Payer: Medicare HMO | Source: Ambulatory Visit | Attending: Obstetrics & Gynecology | Admitting: Obstetrics & Gynecology

## 2017-08-20 DIAGNOSIS — Z1231 Encounter for screening mammogram for malignant neoplasm of breast: Secondary | ICD-10-CM

## 2017-08-21 ENCOUNTER — Encounter: Payer: Self-pay | Admitting: Internal Medicine

## 2017-08-21 ENCOUNTER — Ambulatory Visit (INDEPENDENT_AMBULATORY_CARE_PROVIDER_SITE_OTHER): Payer: Medicare HMO | Admitting: Internal Medicine

## 2017-08-21 VITALS — BP 160/82 | HR 57 | Temp 98.1°F | Ht 65.5 in | Wt 180.0 lb

## 2017-08-21 DIAGNOSIS — N951 Menopausal and female climacteric states: Secondary | ICD-10-CM | POA: Diagnosis not present

## 2017-08-21 DIAGNOSIS — E785 Hyperlipidemia, unspecified: Secondary | ICD-10-CM | POA: Diagnosis not present

## 2017-08-21 DIAGNOSIS — M5430 Sciatica, unspecified side: Secondary | ICD-10-CM | POA: Diagnosis not present

## 2017-08-21 DIAGNOSIS — Z0000 Encounter for general adult medical examination without abnormal findings: Secondary | ICD-10-CM

## 2017-08-21 DIAGNOSIS — M150 Primary generalized (osteo)arthritis: Secondary | ICD-10-CM | POA: Diagnosis not present

## 2017-08-21 DIAGNOSIS — M8949 Other hypertrophic osteoarthropathy, multiple sites: Secondary | ICD-10-CM

## 2017-08-21 DIAGNOSIS — M159 Polyosteoarthritis, unspecified: Secondary | ICD-10-CM

## 2017-08-21 LAB — COMPREHENSIVE METABOLIC PANEL
ALT: 17 U/L (ref 0–35)
AST: 21 U/L (ref 0–37)
Albumin: 4.5 g/dL (ref 3.5–5.2)
Alkaline Phosphatase: 71 U/L (ref 39–117)
BUN: 15 mg/dL (ref 6–23)
CO2: 28 mEq/L (ref 19–32)
Calcium: 9.6 mg/dL (ref 8.4–10.5)
Chloride: 102 mEq/L (ref 96–112)
Creatinine, Ser: 0.73 mg/dL (ref 0.40–1.20)
GFR: 83.92 mL/min (ref >60.00)
Glucose, Bld: 94 mg/dL (ref 70–99)
Potassium: 4.2 mEq/L (ref 3.5–5.1)
Sodium: 139 mEq/L (ref 135–145)
Total Bilirubin: 0.9 mg/dL (ref 0.2–1.2)
Total Protein: 6.9 g/dL (ref 6.0–8.3)

## 2017-08-21 LAB — CBC WITH DIFFERENTIAL/PLATELET
Basophils Absolute: 0 10*3/uL (ref 0.0–0.1)
Basophils Relative: 0.6 % (ref 0.0–3.0)
Eosinophils Absolute: 0.1 10*3/uL (ref 0.0–0.7)
Eosinophils Relative: 2.6 % (ref 0.0–5.0)
HCT: 39.1 % (ref 36.0–46.0)
Hemoglobin: 13.1 g/dL (ref 12.0–15.0)
Lymphocytes Relative: 35.6 % (ref 12.0–46.0)
Lymphs Abs: 1.7 10*3/uL (ref 0.7–4.0)
MCHC: 33.4 g/dL (ref 30.0–36.0)
MCV: 102.6 fl — ABNORMAL HIGH (ref 78.0–100.0)
Monocytes Absolute: 0.5 10*3/uL (ref 0.1–1.0)
Monocytes Relative: 9.4 % (ref 3.0–12.0)
Neutro Abs: 2.5 10*3/uL (ref 1.4–7.7)
Neutrophils Relative %: 51.8 % (ref 43.0–77.0)
Platelets: 176 10*3/uL (ref 150.0–400.0)
RBC: 3.81 Mil/uL — ABNORMAL LOW (ref 3.87–5.11)
RDW: 14.1 % (ref 11.5–15.5)
WBC: 4.9 10*3/uL (ref 4.0–10.5)

## 2017-08-21 LAB — LIPID PANEL
Cholesterol: 268 mg/dL — ABNORMAL HIGH (ref 0–200)
HDL: 59.6 mg/dL (ref >39.00)
LDL Cholesterol: 170 mg/dL — ABNORMAL HIGH (ref 0–99)
NonHDL: 208.51
Total CHOL/HDL Ratio: 4
Triglycerides: 192 mg/dL — ABNORMAL HIGH (ref 0.0–149.0)
VLDL: 38.4 mg/dL (ref 0.0–40.0)

## 2017-08-21 LAB — TSH: TSH: 2.59 u[IU]/mL (ref 0.35–4.50)

## 2017-08-21 MED ORDER — HYDROCHLOROTHIAZIDE 25 MG PO TABS: 25.0000 mg | ORAL_TABLET | Freq: Every day | ORAL | 3 refills | Status: DC

## 2017-08-21 NOTE — Progress Notes (Signed)
Subjective:    Patient ID: Shelly Bowers, female    DOB: December 24, 1950, 67 y.o.   MRN: [id number]  HPI  67 year old patient who is seen today for annual health assessment and a subsequent Medicare wellness visit  She has a history of osteoarthritis and does take daily Celebrex.  She has a prior history of low back pain and sciatica which has since resolved with yoga and other stretching type exercises.  She also has retired and no longer spends time on hard flooring.  She does have osteoarthritic pain involving the small joints of her hands  .  She has been monitoring home blood pressure readings with consistently high systolic readings greater than 140    She has had a recent gynecologic check including a mammogram.  Cologuard testing 2017  No past medical history on file.   Social History   Socioeconomic History  . Marital status: Married    Spouse name: Not on file  . Number of children: Not on file  . Years of education: Not on file  . Highest education level: Not on file  Occupational History  . Not on file  Social Needs  . Financial resource strain: Not on file  . Food insecurity:    Worry: Not on file    Inability: Not on file  . Transportation needs:    Medical: Not on file    Non-medical: Not on file  Tobacco Use  . Smoking status: Never Smoker  . Smokeless tobacco: Never Used  Substance and Sexual Activity  . Alcohol use: Yes    Comment: occ  . Drug use: No  . Sexual activity: Not on file  Lifestyle  . Physical activity:    Days per week: Not on file    Minutes per session: Not on file  . Stress: Not on file  Relationships  . Social connections:    Talks on phone: Not on file    Gets together: Not on file    Attends religious service: Not on file    Active member of club or organization: Not on file    Attends meetings of clubs or organizations: Not on file    Relationship status: Not on file  . Intimate partner violence:    Fear of current or  ex partner: Not on file    Emotionally abused: Not on file    Physically abused: Not on file    Forced sexual activity: Not on file  Other Topics Concern  . Not on file  Social History Narrative  . Not on file    Past Surgical History:  Procedure Laterality Date  . AUGMENTATION MAMMAPLASTY Bilateral   . BREAST EXCISIONAL BIOPSY Left   . INCISION AND DRAINAGE ABSCESS Left 12/27/2015   Procedure: INCISION AND DRAINAGE ABSCESS;  Surgeon: Daryll Brod, MD;  Location: [facility];  Service: Orthopedics;  Laterality: Left;    Family History  Problem Relation Age of Onset  . Breast cancer Mother 83    Allergies  Allergen Reactions  . Codeine Nausea Only  . Codeine Sulfate     REACTION: unspecified  . Hydrocodone Nausea And Vomiting    Current Outpatient Medications on File Prior to Visit  Medication Sig Dispense Refill  . celecoxib (CELEBREX) 200 MG capsule TAKE 1 CAPSULE BY MOUTH 2 TIMES DAILY AS NEEDED 90 capsule 1  . ibuprofen (ADVIL,MOTRIN) 200 MG tablet Take 200 mg by mouth every 6 (six) hours as needed.      Marland Kitchen  meclizine (ANTIVERT) 25 MG tablet Take 1 tablet (25 mg total) by mouth 3 (three) times daily as needed for dizziness. (Patient not taking: Reported on 08/21/2017) 30 tablet 0  . progesterone (PROMETRIUM) 100 MG capsule Prometrium 100 mg capsule    . temazepam (RESTORIL) 15 MG capsule Take 1 capsule (15 mg total) by mouth at bedtime as needed for sleep. (Patient not taking: Reported on 08/21/2017) 30 capsule 0   No current facility-administered medications on file prior to visit.     BP (!) 160/82 (BP Location: Right Arm, Patient Position: Sitting, Cuff Size: Large)   Pulse (!) 57   Temp 98.1 F (36.7 C) (Oral)   Ht 5' 5.5" (1.664 m)   Wt 180 lb (81.6 kg)   SpO2 97%   BMI 29.50 kg/m    Subsequent Medicare wellness visit  1. Risk factors, based on past  M,S,F history.  Cardiovascular risk factors include a history of dyslipidemia and  hypertension  2.  Physical activities:  Remains quite active with yoga and Zumba type activities walks.  No exercise limitations  3.  Depression/mood:No history of depression or mood disorder  4.  Hearing:No deficits  5.  ADL's:Independent in all aspects of daily living  6.  Fall risk:Low.  She has had one fall over the past year when she tripped at her health club  7.  Home safety:No problems identified  8.  Height weight, and visual acuity;Height and weight stable no change in visual acuity  9.  Counseling:Heart healthy diet recommended.  Will place on DASH diet  10. Lab orders based on risk factors:Laboratory update will be reviewed including lipid profile  11. Referral :None appropriate at this time  12. Care plan:Will start diuretic therapy for systolic hypertension.  Placed on a DASH diet  13. Cognitive assessment: Alert and appropriate normal affect.  No cognitive dysfunction  14. Screening: Patient provided with a written and personalized 5-10 year screening schedule in the AVS.  Repeat Cologuard testing 1 year  15. Provider List Update: Primary care OB/GYN ophthalmology    Review of Systems  Constitutional: Negative.   HENT: Negative for congestion, dental problem, hearing loss, rhinorrhea, sinus pressure, sore throat and tinnitus.   Eyes: Negative for pain, discharge and visual disturbance.  Respiratory: Negative for cough and shortness of breath.   Cardiovascular: Negative for chest pain, palpitations and leg swelling.  Gastrointestinal: Negative for abdominal distention, abdominal pain, blood in stool, constipation, diarrhea, nausea and vomiting.  Genitourinary: Negative for difficulty urinating, dysuria, flank pain, frequency, hematuria, pelvic pain, urgency, vaginal bleeding, vaginal discharge and vaginal pain.  Musculoskeletal: Positive for arthralgias. Negative for gait problem and joint swelling.       Arthritic pain involving the small joints of both hands   Skin: Negative for rash.  Neurological: Negative for dizziness, syncope, speech difficulty, weakness, numbness and headaches.  Hematological: Negative for adenopathy.  Psychiatric/Behavioral: Negative for agitation, behavioral problems and dysphoric mood. The patient is not nervous/anxious.        Objective:   Physical Exam  Constitutional: She is oriented to person, place, and time. She appears well-developed and well-nourished.  Blood pressure 160/80 in both arms  HENT:  Head: Normocephalic and atraumatic.  Right Ear: External ear normal.  Left Ear: External ear normal.  Mouth/Throat: Oropharynx is clear and moist.  Eyes: Conjunctivae and EOM are normal.  Neck: Normal range of motion. Neck supple. No JVD present. No thyromegaly present.  Cardiovascular: Normal rate, regular rhythm, normal heart  sounds and intact distal pulses.  No murmur heard. Pedal pulses full  Pulmonary/Chest: Effort normal and breath sounds normal. She has no wheezes. She has no rales.  Abdominal: Soft. Bowel sounds are normal. She exhibits no distension and no mass. There is no tenderness. There is no rebound and no guarding.  Genitourinary: Vagina normal.  Musculoskeletal: Normal range of motion. She exhibits no edema or tenderness.  Osteoarthritic changes involving the small joints of the hands  Neurological: She is alert and oriented to person, place, and time. She has normal reflexes. She displays normal reflexes. No cranial nerve deficit. She exhibits normal muscle tone. Coordination normal.  Skin: Skin is warm and dry. No rash noted.  Psychiatric: She has a normal mood and affect. Her behavior is normal.          Assessment & Plan:   Preventive health exam Subsequent Medicare wellness visit Essential hypertension.  Will place on hydrochlorothiazide 25 mg daily.  Information concerning DASH diet given.  Recheck 6 weeks Osteoarthritis.  Continue Celebrex as needed Dyslipidemia.  Will review a  lipid profile.  Heart healthy diet recommended  Return in 6 weeks for follow-up.  Patient will monitor blood pressure with her home blood pressure cuff and keep a diary of blood pressure readings.  The patient will bring her home blood pressure cuff to her next office visit for calibration  Nyoka Cowden

## 2017-08-21 NOTE — Patient Instructions (Addendum)
Limit your sodium (Salt) intake  Please check your blood pressure on a regular basis.  If it is consistently greater than 150/90, please make an office appointment.    It is important that you exercise regularly, at least 20 minutes 3 to 4 times per week.  If you develop chest pain or shortness of breath seek  medical attention.   DASH Eating Plan DASH stands for "Dietary Approaches to Stop Hypertension." The DASH eating plan is a healthy eating plan that has been shown to reduce high blood pressure (hypertension). It may also reduce your risk for type 2 diabetes, heart disease, and stroke. The DASH eating plan may also help with weight loss. What are tips for following this plan? General guidelines  Avoid eating more than 2,300 mg (milligrams) of salt (sodium) a day. If you have hypertension, you may need to reduce your sodium intake to 1,500 mg a day.  Limit alcohol intake to no more than 1 drink a day for nonpregnant women and 2 drinks a day for men. One drink equals 12 oz of beer, 5 oz of wine, or 1 oz of hard liquor.  Work with your health care provider to maintain a healthy body weight or to lose weight. Ask what an ideal weight is for you.  Get at least 30 minutes of exercise that causes your heart to beat faster (aerobic exercise) most days of the week. Activities may include walking, swimming, or biking.  Work with your health care provider or diet and nutrition specialist (dietitian) to adjust your eating plan to your individual calorie needs. Reading food labels  Check food labels for the amount of sodium per serving. Choose foods with less than 5 percent of the Daily Value of sodium. Generally, foods with less than 300 mg of sodium per serving fit into this eating plan.  To find whole grains, look for the word "whole" as the first word in the ingredient list. Shopping  Buy products labeled as "low-sodium" or "no salt added."  Buy fresh foods. Avoid canned foods and premade  or frozen meals. Cooking  Avoid adding salt when cooking. Use salt-free seasonings or herbs instead of table salt or sea salt. Check with your health care provider or pharmacist before using salt substitutes.  Do not fry foods. Cook foods using healthy methods such as baking, boiling, grilling, and broiling instead.  Cook with heart-healthy oils, such as olive, canola, soybean, or sunflower oil. Meal planning   Eat a balanced diet that includes: ? 5 or more servings of fruits and vegetables each day. At each meal, try to fill half of your plate with fruits and vegetables. ? Up to 6-8 servings of whole grains each day. ? Less than 6 oz of lean meat, poultry, or fish each day. A 3-oz serving of meat is about the same size as a deck of cards. One egg equals 1 oz. ? 2 servings of low-fat dairy each day. ? A serving of nuts, seeds, or beans 5 times each week. ? Heart-healthy fats. Healthy fats called Omega-3 fatty acids are found in foods such as flaxseeds and coldwater fish, like sardines, salmon, and mackerel.  Limit how much you eat of the following: ? Canned or prepackaged foods. ? Food that is high in trans fat, such as fried foods. ? Food that is high in saturated fat, such as fatty meat. ? Sweets, desserts, sugary drinks, and other foods with added sugar. ? Full-fat dairy products.  Do not salt foods  before eating.  Try to eat at least 2 vegetarian meals each week.  Eat more home-cooked food and less restaurant, buffet, and fast food.  When eating at a restaurant, ask that your food be prepared with less salt or no salt, if possible. What foods are recommended? The items listed may not be a complete list. Talk with your dietitian about what dietary choices are best for you. Grains Whole-grain or whole-wheat bread. Whole-grain or whole-wheat pasta. Brown rice. [place] Morrow. Bulgur. Whole-grain and low-sodium cereals. Pita bread. Low-fat, low-sodium crackers. Whole-wheat flour  tortillas. Vegetables Fresh or frozen vegetables (raw, steamed, roasted, or grilled). Low-sodium or reduced-sodium tomato and vegetable juice. Low-sodium or reduced-sodium tomato sauce and tomato paste. Low-sodium or reduced-sodium canned vegetables. Fruits All fresh, dried, or frozen fruit. Canned fruit in natural juice (without added sugar). Meat and other protein foods Skinless chicken or [place]. Ground chicken or [place]. Pork with fat trimmed off. Fish and seafood. Egg whites. Dried beans, peas, or lentils. Unsalted nuts, nut butters, and seeds. Unsalted canned beans. Lean cuts of beef with fat trimmed off. Low-sodium, lean deli meat. Dairy Low-fat (1%) or fat-free (skim) milk. Fat-free, low-fat, or reduced-fat cheeses. Nonfat, low-sodium ricotta or cottage cheese. Low-fat or nonfat yogurt. Low-fat, low-sodium cheese. Fats and oils Soft margarine without trans fats. Vegetable oil. Low-fat, reduced-fat, or light mayonnaise and salad dressings (reduced-sodium). Canola, safflower, olive, soybean, and sunflower oils. Avocado. Seasoning and other foods Herbs. Spices. Seasoning mixes without salt. Unsalted popcorn and pretzels. Fat-free sweets. What foods are not recommended? The items listed may not be a complete list. Talk with your dietitian about what dietary choices are best for you. Grains Baked goods made with fat, such as croissants, muffins, or some breads. Dry pasta or rice meal packs. Vegetables Creamed or fried vegetables. Vegetables in a cheese sauce. Regular canned vegetables (not low-sodium or reduced-sodium). Regular canned tomato sauce and paste (not low-sodium or reduced-sodium). Regular tomato and vegetable juice (not low-sodium or reduced-sodium). Angie Fava. Olives. Fruits Canned fruit in a light or heavy syrup. Fried fruit. Fruit in cream or butter sauce. Meat and other protein foods Fatty cuts of meat. Ribs. Fried meat. Berniece Salines. Sausage. Bologna and other processed lunch meats.  Salami. Fatback. Hotdogs. Bratwurst. Salted nuts and seeds. Canned beans with added salt. Canned or smoked fish. Whole eggs or egg yolks. Chicken or [place] with skin. Dairy Whole or 2% milk, cream, and half-and-half. Whole or full-fat cream cheese. Whole-fat or sweetened yogurt. Full-fat cheese. Nondairy creamers. Whipped toppings. Processed cheese and cheese spreads. Fats and oils Butter. Stick margarine. Lard. Shortening. Ghee. Bacon fat. Tropical oils, such as coconut, palm kernel, or palm oil. Seasoning and other foods Salted popcorn and pretzels. Onion salt, garlic salt, seasoned salt, table salt, and sea salt. Worcestershire sauce. Tartar sauce. Barbecue sauce. Teriyaki sauce. Soy sauce, including reduced-sodium. Steak sauce. Canned and packaged gravies. Fish sauce. Oyster sauce. Cocktail sauce. Horseradish that you find on the shelf. Ketchup. Mustard. Meat flavorings and tenderizers. Bouillon cubes. Hot sauce and Tabasco sauce. Premade or packaged marinades. Premade or packaged taco seasonings. Relishes. Regular salad dressings. Where to find more information:  National Heart, Lung, and [place]: https://wilson-eaton.com/  American Heart Association: www.heart.org Summary  The DASH eating plan is a healthy eating plan that has been shown to reduce high blood pressure (hypertension). It may also reduce your risk for type 2 diabetes, heart disease, and stroke.  With the DASH eating plan, you should limit salt (sodium) intake to 2,300 mg  a day. If you have hypertension, you may need to reduce your sodium intake to 1,500 mg a day.  When on the DASH eating plan, aim to eat more fresh fruits and vegetables, whole grains, lean proteins, low-fat dairy, and heart-healthy fats.  Work with your health care provider or diet and nutrition specialist (dietitian) to adjust your eating plan to your individual calorie needs. This information is not intended to replace advice given to you by your health  care provider. Make sure you discuss any questions you have with your health care provider. Document Released: 03/01/2011 Document Revised: 03/05/2016 Document Reviewed: 03/05/2016 Elsevier Interactive Patient Education  [employer].

## 2017-08-22 LAB — HEPATITIS C ANTIBODY
Hepatitis C Ab: NONREACTIVE
SIGNAL TO CUT-OFF: 0.01 (ref <1.00)

## 2017-10-02 ENCOUNTER — Encounter: Payer: Self-pay | Admitting: Internal Medicine

## 2017-10-02 ENCOUNTER — Ambulatory Visit (INDEPENDENT_AMBULATORY_CARE_PROVIDER_SITE_OTHER): Payer: Medicare HMO | Admitting: Internal Medicine

## 2017-10-02 DIAGNOSIS — I10 Essential (primary) hypertension: Secondary | ICD-10-CM | POA: Insufficient documentation

## 2017-10-02 NOTE — Patient Instructions (Signed)
Limit your sodium (Salt) intake  Continue DASH diet  Please check your blood pressure on a regular basis.  If it is consistently greater than 140/90, please make an office appointment.    Return in 4 to 6 months for follow-up

## 2017-10-02 NOTE — Progress Notes (Signed)
Subjective:    Patient ID: Shelly Bowers, female    DOB: [birth year], 67 y.o.   MRN: [id number]  HPI 67 year old patient who is seen today for follow-up of essential hypertension. She was placed on diuretic therapy 6 weeks ago as well as a DASH diet due to elevated blood pressure readings with systolic readings approximately 160 she has done quite well.  She continues to monitor blood pressure readings  several times weekly with much improved results  History reviewed. No pertinent past medical history.   Social History   Socioeconomic History  . Marital status: Married    Spouse name: Not on file  . Number of children: Not on file  . Years of education: Not on file  . Highest education level: Not on file  Occupational History  . Not on file  Social Needs  . Financial resource strain: Not on file  . Food insecurity:    Worry: Not on file    Inability: Not on file  . Transportation needs:    Medical: Not on file    Non-medical: Not on file  Tobacco Use  . Smoking status: Never Smoker  . Smokeless tobacco: Never Used  Substance and Sexual Activity  . Alcohol use: Yes    Comment: occ  . Drug use: No  . Sexual activity: Not on file  Lifestyle  . Physical activity:    Days per week: Not on file    Minutes per session: Not on file  . Stress: Not on file  Relationships  . Social connections:    Talks on phone: Not on file    Gets together: Not on file    Attends religious service: Not on file    Active member of club or organization: Not on file    Attends meetings of clubs or organizations: Not on file    Relationship status: Not on file  . Intimate partner violence:    Fear of current or ex partner: Not on file    Emotionally abused: Not on file    Physically abused: Not on file    Forced sexual activity: Not on file  Other Topics Concern  . Not on file  Social History Narrative  . Not on file    Past Surgical History:  Procedure Laterality Date  .  AUGMENTATION MAMMAPLASTY Bilateral   . BREAST EXCISIONAL BIOPSY Left   . INCISION AND DRAINAGE ABSCESS Left 12/27/2015   Procedure: INCISION AND DRAINAGE ABSCESS;  Surgeon: Daryll Brod, MD;  Location: [facility];  Service: Orthopedics;  Laterality: Left;    Family History  Problem Relation Age of Onset  . Breast cancer Mother 17    Allergies  Allergen Reactions  . Codeine Nausea Only  . Codeine Sulfate     REACTION: unspecified  . Hydrocodone Nausea And Vomiting    Current Outpatient Medications on File Prior to Visit  Medication Sig Dispense Refill  . celecoxib (CELEBREX) 200 MG capsule TAKE 1 CAPSULE BY MOUTH 2 TIMES DAILY AS NEEDED 90 capsule 1  . hydrochlorothiazide (HYDRODIURIL) 25 MG tablet Take 1 tablet (25 mg total) by mouth daily. 90 tablet 3  . ibuprofen (ADVIL,MOTRIN) 200 MG tablet Take 200 mg by mouth every 6 (six) hours as needed.      . meclizine (ANTIVERT) 25 MG tablet Take 1 tablet (25 mg total) by mouth 3 (three) times daily as needed for dizziness. 30 tablet 0  . progesterone (PROMETRIUM) 100 MG capsule Prometrium 100 mg capsule    .  temazepam (RESTORIL) 15 MG capsule Take 1 capsule (15 mg total) by mouth at bedtime as needed for sleep. 30 capsule 0   No current facility-administered medications on file prior to visit.     BP 130/72 (BP Location: Left Arm, Patient Position: Sitting, Cuff Size: Large)   Pulse (!) 58   Temp 98.1 F (36.7 C) (Oral)   Wt 180 lb (81.6 kg)   SpO2 97%   BMI 29.50 kg/m      Review of Systems  Constitutional: Negative.   Cardiovascular: Negative.   Musculoskeletal: Negative.        Objective:   Physical Exam  Constitutional: She appears well-developed and well-nourished. No distress.  Blood pressure 138/78  Cardiovascular: Normal rate.  Pulmonary/Chest: Effort normal and breath sounds normal.  Musculoskeletal: She exhibits no edema.          Assessment & Plan:    Essential hypertension.  Improved  we will continue present regimen.  We will continue DASH diet and lifestyle issues.  Continue home blood pressure monitoring follow-up 4 to 6 months  Marletta Lor

## 2018-01-22 ENCOUNTER — Ambulatory Visit (INDEPENDENT_AMBULATORY_CARE_PROVIDER_SITE_OTHER): Payer: Medicare HMO

## 2018-01-22 ENCOUNTER — Telehealth: Payer: Self-pay | Admitting: *Deleted

## 2018-01-22 DIAGNOSIS — Z23 Encounter for immunization: Secondary | ICD-10-CM

## 2018-02-28 ENCOUNTER — Encounter: Payer: Self-pay | Admitting: Internal Medicine

## 2018-02-28 ENCOUNTER — Ambulatory Visit (INDEPENDENT_AMBULATORY_CARE_PROVIDER_SITE_OTHER): Payer: Medicare HMO | Admitting: Internal Medicine

## 2018-02-28 VITALS — BP 130/80 | HR 71 | Temp 97.7°F | Wt 185.1 lb

## 2018-02-28 DIAGNOSIS — E785 Hyperlipidemia, unspecified: Secondary | ICD-10-CM

## 2018-02-28 DIAGNOSIS — I10 Essential (primary) hypertension: Secondary | ICD-10-CM

## 2018-02-28 DIAGNOSIS — M85852 Other specified disorders of bone density and structure, left thigh: Secondary | ICD-10-CM | POA: Diagnosis not present

## 2018-02-28 DIAGNOSIS — M85851 Other specified disorders of bone density and structure, right thigh: Secondary | ICD-10-CM | POA: Diagnosis not present

## 2018-02-28 DIAGNOSIS — M8580 Other specified disorders of bone density and structure, unspecified site: Secondary | ICD-10-CM | POA: Insufficient documentation

## 2018-02-28 LAB — BASIC METABOLIC PANEL
BUN: 16 mg/dL (ref 6–23)
CO2: 32 mEq/L (ref 19–32)
Calcium: 9.6 mg/dL (ref 8.4–10.5)
Chloride: 101 mEq/L (ref 96–112)
Creatinine, Ser: 0.88 mg/dL (ref 0.40–1.20)
GFR: 67.54 mL/min (ref >60.00)
Glucose, Bld: 98 mg/dL (ref 70–99)
Potassium: 4 mEq/L (ref 3.5–5.1)
Sodium: 140 mEq/L (ref 135–145)

## 2018-02-28 LAB — VITAMIN D 25 HYDROXY (VIT D DEFICIENCY, FRACTURES): VITD: 20.4 ng/mL — ABNORMAL LOW (ref 30.00–100.00)

## 2018-02-28 MED ORDER — ATORVASTATIN CALCIUM 10 MG PO TABS: 10.0000 mg | ORAL_TABLET | Freq: Every day | ORAL | 3 refills | Status: DC

## 2018-02-28 MED ORDER — ASPIRIN EC 81 MG PO TBEC: 81.0000 mg | DELAYED_RELEASE_TABLET | Freq: Every day | ORAL | Status: DC

## 2018-02-28 NOTE — Patient Instructions (Addendum)
-  It was nice meeting you today!  -Lab work today, we will contact you with results.  -Please start taking Lipitor 10 mg daily.  -I reviewed your DEXA scan from 2018: you have osteopenia (weak bones but not to the level of osteoporosis).  We will check your vitamin D levels today and I will call you back to let you know what dose of vitamin D you should be taking.  -Please schedule follow-up with me in May 2020 for your annual physical.  Please come fasting to that visit.

## 2018-02-28 NOTE — Progress Notes (Addendum)
Established Patient Office Visit     CC/Reason for Visit: Establish care, follow-up on chronic conditions  HPI: Shelly Bowers is a 67 y.o. female who is coming in today for the above mentioned reasons.  She is due for annual physical in May 2020.  Past Medical History is significant for: Hypertension who was started on hydrochlorothiazide of the summer.  During her annual physical in May it was noted that her LDL was elevated , it does not appear that this was discussed at that time.  Review of her DEXA scan from October 2018 also shows osteopenia with a right femoral neck score of -1.6 and a left femoral neck score -1.3.  Marland Kitchen  She has really no acute complaints today.   Past Medical/Surgical History: Past Medical History:  Diagnosis Date  . HTN (hypertension)   . Hyperlipidemia   . Osteopenia     Past Surgical History:  Procedure Laterality Date  . AUGMENTATION MAMMAPLASTY Bilateral   . BREAST EXCISIONAL BIOPSY Left   . INCISION AND DRAINAGE ABSCESS Left 12/27/2015   Procedure: INCISION AND DRAINAGE ABSCESS;  Surgeon: Daryll Brod, MD;  Location: [facility];  Service: Orthopedics;  Laterality: Left;    Social History:  reports that she has never smoked. She has never used smokeless tobacco. She reports that she drinks alcohol. She reports that she does not use drugs.  Allergies: Allergies  Allergen Reactions  . Codeine Nausea Only  . Codeine Sulfate     REACTION: unspecified  . Hydrocodone Nausea And Vomiting    Family History:  Family History  Problem Relation Age of Onset  . Breast cancer Mother 67     Current Outpatient Medications:  .  celecoxib (CELEBREX) 200 MG capsule, TAKE 1 CAPSULE BY MOUTH 2 TIMES DAILY AS NEEDED (Patient taking differently: Take 200 mg by mouth daily. ), Disp: 90 capsule, Rfl: 1 .  hydrochlorothiazide (HYDRODIURIL) 25 MG tablet, Take 1 tablet (25 mg total) by mouth daily., Disp: 90 tablet, Rfl: 3 .  ibuprofen  (ADVIL,MOTRIN) 200 MG tablet, Take 200 mg by mouth every 6 (six) hours as needed.  , Disp: , Rfl:  .  temazepam (RESTORIL) 15 MG capsule, Take 1 capsule (15 mg total) by mouth at bedtime as needed for sleep., Disp: 30 capsule, Rfl: 0 .  aspirin EC 81 MG tablet, Take 1 tablet (81 mg total) by mouth daily., Disp: , Rfl:  .  atorvastatin (LIPITOR) 10 MG tablet, Take 1 tablet (10 mg total) by mouth daily., Disp: 90 tablet, Rfl: 3  Review of Systems:  Constitutional: Denies fever, chills, diaphoresis, appetite change and fatigue.  HEENT: Denies photophobia, eye pain, redness, hearing loss, ear pain, congestion, sore throat, rhinorrhea, sneezing, mouth sores, trouble swallowing, neck pain, neck stiffness and tinnitus.   Respiratory: Denies SOB, DOE, cough, chest tightness,  and wheezing.   Cardiovascular: Denies chest pain, palpitations and leg swelling.  Gastrointestinal: Denies nausea, vomiting, abdominal pain, diarrhea, constipation, blood in stool and abdominal distention.  Genitourinary: Denies dysuria, urgency, frequency, hematuria, flank pain and difficulty urinating.  Endocrine: Denies: hot or cold intolerance, sweats, changes in hair or nails, polyuria, polydipsia. Musculoskeletal: Denies myalgias, back pain, joint swelling, arthralgias and gait problem.  Skin: Denies pallor, rash and wound.  Neurological: Denies dizziness, seizures, syncope, weakness, light-headedness, numbness and headaches.  Hematological: Denies adenopathy. Easy bruising, personal or family bleeding history  Psychiatric/Behavioral: Denies suicidal ideation, mood changes, confusion, nervousness, sleep disturbance and agitation  Physical Exam: Vitals:   02/28/18 1426  BP: 130/80  Pulse: 71  Temp: 97.7 F (36.5 C)  TempSrc: Oral  SpO2: 96%  Weight: 185 lb 1.6 oz (84 kg)    Body mass index is 30.33 kg/m.   Constitutional: NAD, calm, comfortable Eyes: PERRL, lids and conjunctivae normal ENMT: Mucous  membranes are moist. Posterior pharynx clear of any exudate or lesions. Normal dentition.  Neck: normal, supple, no masses, no thyromegaly Respiratory: clear to auscultation bilaterally, no wheezing, no crackles. Normal respiratory effort. No accessory muscle use.  Cardiovascular: Regular rate and rhythm, no murmurs / rubs / gallops. No extremity edema. 2+ pedal pulses. No carotid bruits.  Abdomen: no tenderness, no masses palpated. No hepatosplenomegaly. Bowel sounds positive.  Musculoskeletal: no clubbing / cyanosis. No joint deformity upper and lower extremities. Good ROM, no contractures. Normal muscle tone.  Skin: no rashes, lesions, ulcers. No induration Neurologic: Grossly intact and nonfocal. Psychiatric: Normal judgment and insight. Alert and oriented x 3. Normal mood.    Impression and Plan:  Essential hypertension -Appears well-controlled since starting hydrochlorothiazide over the summer. -She has not had a basic metabolic profile to check electrolytes or renal function since starting diuretic therapy, will request today.  Hyperlipidemia, unspecified hyperlipidemia type  -Her LDL during her annual physical in May was noted to be 170. -Given her hypertension, would like to start statin therapy to reduce her vascular risk score. -Start Lipitor 10 mg daily. -She is already on ASA 81 mg daily.  Osteopenia of necks of both femurs  -Per DEXA in 10/18. -We will check vitamin D levels today. -Afterwards, will advise patient on amount of calcium and vitamin D she should be taking.  She wants to receive the shingles vaccination today, however she is not sure if her insurance will cover the cost.  She will inquire.  Patient Instructions  -It was nice meeting you today!  -Lab work today, we will contact you with results.  -Please start taking Lipitor 10 mg daily.  -I reviewed your DEXA scan from 2018: you have osteopenia (weak bones but not to the level of osteoporosis).  We will  check your vitamin D levels today and I will call you back to let you know what dose of vitamin D you should be taking.  -Please schedule follow-up with me in May 2020 for your annual physical.  Please come fasting to that visit.     Lelon Frohlich, MD [facility]

## 2018-03-03 ENCOUNTER — Other Ambulatory Visit: Payer: Self-pay | Admitting: Internal Medicine

## 2018-03-03 DIAGNOSIS — E559 Vitamin D deficiency, unspecified: Secondary | ICD-10-CM

## 2018-03-03 MED ORDER — VITAMIN D (ERGOCALCIFEROL) 1.25 MG (50000 UNIT) PO CAPS: 50000.0000 [IU] | ORAL_CAPSULE | ORAL | 0 refills | Status: DC

## 2018-03-05 ENCOUNTER — Other Ambulatory Visit: Payer: Self-pay | Admitting: Internal Medicine

## 2018-03-05 DIAGNOSIS — E559 Vitamin D deficiency, unspecified: Secondary | ICD-10-CM

## 2018-03-26 LAB — COLOGUARD: Cologuard: NEGATIVE

## 2018-04-01 DIAGNOSIS — R69 Illness, unspecified: Secondary | ICD-10-CM | POA: Diagnosis not present

## 2018-04-14 DIAGNOSIS — H5203 Hypermetropia, bilateral: Secondary | ICD-10-CM | POA: Diagnosis not present

## 2018-04-14 DIAGNOSIS — H3530 Unspecified macular degeneration: Secondary | ICD-10-CM | POA: Diagnosis not present

## 2018-04-14 DIAGNOSIS — H52222 Regular astigmatism, left eye: Secondary | ICD-10-CM | POA: Diagnosis not present

## 2018-04-15 ENCOUNTER — Encounter: Payer: Self-pay | Admitting: Internal Medicine

## 2018-04-15 DIAGNOSIS — L821 Other seborrheic keratosis: Secondary | ICD-10-CM | POA: Diagnosis not present

## 2018-04-15 DIAGNOSIS — D0472 Carcinoma in situ of skin of left lower limb, including hip: Secondary | ICD-10-CM | POA: Diagnosis not present

## 2018-04-15 DIAGNOSIS — D485 Neoplasm of uncertain behavior of skin: Secondary | ICD-10-CM | POA: Diagnosis not present

## 2018-04-15 DIAGNOSIS — L820 Inflamed seborrheic keratosis: Secondary | ICD-10-CM | POA: Diagnosis not present

## 2018-04-15 DIAGNOSIS — Z0100 Encounter for examination of eyes and vision without abnormal findings: Secondary | ICD-10-CM | POA: Diagnosis not present

## 2018-04-21 DIAGNOSIS — D0472 Carcinoma in situ of skin of left lower limb, including hip: Secondary | ICD-10-CM | POA: Diagnosis not present

## 2018-04-25 ENCOUNTER — Encounter: Payer: Self-pay | Admitting: Internal Medicine

## 2018-04-25 DIAGNOSIS — C4492 Squamous cell carcinoma of skin, unspecified: Secondary | ICD-10-CM | POA: Insufficient documentation

## 2018-04-29 DIAGNOSIS — R69 Illness, unspecified: Secondary | ICD-10-CM | POA: Diagnosis not present

## 2018-05-29 ENCOUNTER — Other Ambulatory Visit (INDEPENDENT_AMBULATORY_CARE_PROVIDER_SITE_OTHER): Payer: Medicare HMO

## 2018-05-29 DIAGNOSIS — E559 Vitamin D deficiency, unspecified: Secondary | ICD-10-CM

## 2018-05-29 LAB — VITAMIN D 25 HYDROXY (VIT D DEFICIENCY, FRACTURES): VITD: 36.23 ng/mL (ref 30.00–100.00)

## 2018-06-05 NOTE — Telephone Encounter (Signed)
Error

## 2018-08-20 ENCOUNTER — Telehealth: Payer: Self-pay | Admitting: Internal Medicine

## 2018-08-20 NOTE — Telephone Encounter (Signed)
Copied from [place]. Topic: Quick Communication - Rx Refill/Question >> Aug 20, 2018  4:18 PM Ahmed Prima L wrote: Medication: hydrochlorothiazide (HYDRODIURIL) 25 MG tablet  Has the patient contacted their pharmacy? Yes  (Agent: If no, request that the patient contact the pharmacy for the refill.) (Agent: If yes, when and what did the pharmacy advise?)  Preferred Pharmacy (with phone number or street name): [facility] [address] [place] - [place] [address] [place] [address] Phone: [phone] Fax: [phone]    Agent: Please be advised that RX refills may take up to 3 business days. We ask that you follow-up with your pharmacy.

## 2018-08-21 MED ORDER — HYDROCHLOROTHIAZIDE 25 MG PO TABS: 25.0000 mg | ORAL_TABLET | Freq: Every day | ORAL | 1 refills | Status: DC

## 2019-01-07 ENCOUNTER — Ambulatory Visit: Payer: Medicare HMO

## 2019-01-14 ENCOUNTER — Ambulatory Visit (INDEPENDENT_AMBULATORY_CARE_PROVIDER_SITE_OTHER): Payer: Medicare HMO

## 2019-01-14 ENCOUNTER — Other Ambulatory Visit: Payer: Self-pay

## 2019-01-14 DIAGNOSIS — Z23 Encounter for immunization: Secondary | ICD-10-CM

## 2019-02-06 ENCOUNTER — Other Ambulatory Visit: Payer: Self-pay | Admitting: Internal Medicine

## 2019-02-06 DIAGNOSIS — E785 Hyperlipidemia, unspecified: Secondary | ICD-10-CM

## 2019-02-10 ENCOUNTER — Other Ambulatory Visit: Payer: Self-pay | Admitting: Internal Medicine

## 2019-03-27 LAB — COLOGUARD: Cologuard: NEGATIVE

## 2019-04-10 ENCOUNTER — Encounter: Payer: Self-pay | Admitting: Internal Medicine

## 2019-04-24 ENCOUNTER — Ambulatory Visit: Payer: Medicare HMO

## 2019-05-05 DIAGNOSIS — H524 Presbyopia: Secondary | ICD-10-CM | POA: Diagnosis not present

## 2019-05-05 DIAGNOSIS — H353131 Nonexudative age-related macular degeneration, bilateral, early dry stage: Secondary | ICD-10-CM | POA: Diagnosis not present

## 2019-05-05 DIAGNOSIS — H52223 Regular astigmatism, bilateral: Secondary | ICD-10-CM | POA: Diagnosis not present

## 2019-05-05 DIAGNOSIS — H5203 Hypermetropia, bilateral: Secondary | ICD-10-CM | POA: Diagnosis not present

## 2019-05-05 DIAGNOSIS — H3122 Choroidal dystrophy (central areolar) (generalized) (peripapillary): Secondary | ICD-10-CM | POA: Diagnosis not present

## 2019-05-05 DIAGNOSIS — H2513 Age-related nuclear cataract, bilateral: Secondary | ICD-10-CM | POA: Diagnosis not present

## 2019-05-15 ENCOUNTER — Ambulatory Visit: Payer: Medicare HMO

## 2019-05-19 ENCOUNTER — Other Ambulatory Visit: Payer: Self-pay | Admitting: Internal Medicine

## 2019-05-19 DIAGNOSIS — E785 Hyperlipidemia, unspecified: Secondary | ICD-10-CM

## 2019-06-11 DIAGNOSIS — R69 Illness, unspecified: Secondary | ICD-10-CM | POA: Diagnosis not present

## 2019-07-03 ENCOUNTER — Other Ambulatory Visit: Payer: Self-pay | Admitting: Internal Medicine

## 2019-07-03 DIAGNOSIS — Z1231 Encounter for screening mammogram for malignant neoplasm of breast: Secondary | ICD-10-CM

## 2019-08-21 ENCOUNTER — Other Ambulatory Visit: Payer: Self-pay | Admitting: Internal Medicine

## 2019-08-21 DIAGNOSIS — E785 Hyperlipidemia, unspecified: Secondary | ICD-10-CM

## 2019-08-22 ENCOUNTER — Other Ambulatory Visit: Payer: Self-pay | Admitting: Internal Medicine

## 2019-08-25 ENCOUNTER — Telehealth: Payer: Self-pay | Admitting: Internal Medicine

## 2019-08-25 DIAGNOSIS — E785 Hyperlipidemia, unspecified: Secondary | ICD-10-CM

## 2019-08-25 NOTE — Telephone Encounter (Signed)
Pt is requesting a refill on Atorvastatin 10 mg. Pt has an appt scheduled on 09/22/19. Pt uses [facility] [address]. Thanks

## 2019-08-26 MED ORDER — ATORVASTATIN CALCIUM 10 MG PO TABS: ORAL_TABLET | ORAL | 0 refills | Status: DC

## 2019-08-26 NOTE — Addendum Note (Signed)
Addended by: Westley Hummer B on: 08/26/2019 12:07 PM   Modules accepted: Orders

## 2019-08-26 NOTE — Telephone Encounter (Signed)
Refill sent.

## 2019-09-22 ENCOUNTER — Other Ambulatory Visit: Payer: Self-pay | Admitting: *Deleted

## 2019-09-22 ENCOUNTER — Encounter: Payer: Self-pay | Admitting: Internal Medicine

## 2019-09-22 ENCOUNTER — Other Ambulatory Visit: Payer: Self-pay

## 2019-09-22 ENCOUNTER — Ambulatory Visit (INDEPENDENT_AMBULATORY_CARE_PROVIDER_SITE_OTHER): Payer: Medicare HMO | Admitting: Internal Medicine

## 2019-09-22 DIAGNOSIS — E785 Hyperlipidemia, unspecified: Secondary | ICD-10-CM

## 2019-09-22 MED ORDER — HYDROCHLOROTHIAZIDE 25 MG PO TABS: 25.0000 mg | ORAL_TABLET | Freq: Every day | ORAL | 1 refills | Status: DC

## 2019-09-22 MED ORDER — ATORVASTATIN CALCIUM 10 MG PO TABS: ORAL_TABLET | ORAL | 1 refills | Status: DC

## 2019-11-12 ENCOUNTER — Encounter: Payer: Medicare HMO | Admitting: Internal Medicine

## 2019-11-24 ENCOUNTER — Telehealth: Payer: Self-pay | Admitting: Internal Medicine

## 2019-11-24 DIAGNOSIS — E785 Hyperlipidemia, unspecified: Secondary | ICD-10-CM

## 2019-11-24 MED ORDER — ATORVASTATIN CALCIUM 10 MG PO TABS: ORAL_TABLET | ORAL | 0 refills | Status: DC

## 2019-11-24 MED ORDER — HYDROCHLOROTHIAZIDE 25 MG PO TABS: 25.0000 mg | ORAL_TABLET | Freq: Every day | ORAL | 0 refills | Status: DC

## 2019-11-24 NOTE — Telephone Encounter (Signed)
Pt stated she had to move her cpe to October and asked for a refill on two meds until she is able to be seen?   Medication: Atorvastatin Hydrochlorothiazide  Pharmacy:  [facility] # [address], [place] Phone:  [phone]  Fax:  [phone]

## 2020-01-13 DIAGNOSIS — R69 Illness, unspecified: Secondary | ICD-10-CM | POA: Diagnosis not present

## 2020-01-22 ENCOUNTER — Ambulatory Visit (INDEPENDENT_AMBULATORY_CARE_PROVIDER_SITE_OTHER): Payer: Medicare HMO | Admitting: Internal Medicine

## 2020-01-22 ENCOUNTER — Encounter: Payer: Self-pay | Admitting: Internal Medicine

## 2020-01-22 ENCOUNTER — Other Ambulatory Visit: Payer: Self-pay

## 2020-01-22 VITALS — BP 140/80 | HR 55 | Temp 98.8°F | Ht 65.0 in | Wt 185.7 lb

## 2020-01-22 DIAGNOSIS — Z0000 Encounter for general adult medical examination without abnormal findings: Secondary | ICD-10-CM | POA: Diagnosis not present

## 2020-01-22 DIAGNOSIS — E559 Vitamin D deficiency, unspecified: Secondary | ICD-10-CM | POA: Diagnosis not present

## 2020-01-22 DIAGNOSIS — M19041 Primary osteoarthritis, right hand: Secondary | ICD-10-CM

## 2020-01-22 DIAGNOSIS — I10 Essential (primary) hypertension: Secondary | ICD-10-CM

## 2020-01-22 DIAGNOSIS — M19042 Primary osteoarthritis, left hand: Secondary | ICD-10-CM | POA: Diagnosis not present

## 2020-01-22 DIAGNOSIS — E785 Hyperlipidemia, unspecified: Secondary | ICD-10-CM | POA: Diagnosis not present

## 2020-01-22 NOTE — Addendum Note (Signed)
Addended by: Westley Hummer B on: 01/22/2020 11:52 AM   Modules accepted: Orders

## 2020-01-22 NOTE — Patient Instructions (Signed)
-Nice seeing you today!!  -Lab work today; will notify you once results are available.  -Shingles a Tdap vaccines at your pharmacy.  -Schedule follow up in 6 months.   Preventive Care 71 Years and Older, Female Preventive care refers to lifestyle choices and visits with your health care provider that can promote health and wellness. This includes:  A yearly physical exam. This is also called an annual well check.  Regular dental and eye exams.  Immunizations.  Screening for certain conditions.  Healthy lifestyle choices, such as diet and exercise. What can I expect for my preventive care visit? Physical exam Your health care provider will check:  Height and weight. These may be used to calculate body mass index (BMI), which is a measurement that tells if you are at a healthy weight.  Heart rate and blood pressure.  Your skin for abnormal spots. Counseling Your health care provider may ask you questions about:  Alcohol, tobacco, and drug use.  Emotional well-being.  Home and relationship well-being.  Sexual activity.  Eating habits.  History of falls.  Memory and ability to understand (cognition).  Work and work Statistician.  Pregnancy and menstrual history. What immunizations do I need?  Influenza (flu) vaccine  This is recommended every year. Tetanus, diphtheria, and pertussis (Tdap) vaccine  You may need a Td booster every 10 years. Varicella (chickenpox) vaccine  You may need this vaccine if you have not already been vaccinated. Zoster (shingles) vaccine  You may need this after age 42. Pneumococcal conjugate (PCV13) vaccine  One dose is recommended after age 37. Pneumococcal polysaccharide (PPSV23) vaccine  One dose is recommended after age 54. Measles, mumps, and rubella (MMR) vaccine  You may need at least one dose of MMR if you were born in 1957 or later. You may also need a second dose. Meningococcal conjugate (MenACWY) vaccine  You  may need this if you have certain conditions. Hepatitis A vaccine  You may need this if you have certain conditions or if you travel or work in places where you may be exposed to hepatitis A. Hepatitis B vaccine  You may need this if you have certain conditions or if you travel or work in places where you may be exposed to hepatitis B. Haemophilus influenzae type b (Hib) vaccine  You may need this if you have certain conditions. You may receive vaccines as individual doses or as more than one vaccine together in one shot (combination vaccines). Talk with your health care provider about the risks and benefits of combination vaccines. What tests do I need? Blood tests  Lipid and cholesterol levels. These may be checked every 5 years, or more frequently depending on your overall health.  Hepatitis C test.  Hepatitis B test. Screening  Lung cancer screening. You may have this screening every year starting at age 79 if you have a 30-pack-year history of smoking and currently smoke or have quit within the past 15 years.  Colorectal cancer screening. All adults should have this screening starting at age 57 and continuing until age 60. Your health care provider may recommend screening at age 59 if you are at increased risk. You will have tests every 1-10 years, depending on your results and the type of screening test.  Diabetes screening. This is done by checking your blood sugar (glucose) after you have not eaten for a while (fasting). You may have this done every 1-3 years.  Mammogram. This may be done every 1-2 years. Talk with your  health care provider about how often you should have regular mammograms.  BRCA-related cancer screening. This may be done if you have a family history of breast, ovarian, tubal, or peritoneal cancers. Other tests  Sexually transmitted disease (STD) testing.  Bone density scan. This is done to screen for osteoporosis. You may have this done starting at age  51. Follow these instructions at home: Eating and drinking  Eat a diet that includes fresh fruits and vegetables, whole grains, lean protein, and low-fat dairy products. Limit your intake of foods with high amounts of sugar, saturated fats, and salt.  Take vitamin and mineral supplements as recommended by your health care provider.  Do not drink alcohol if your health care provider tells you not to drink.  If you drink alcohol: ? Limit how much you have to 0-1 drink a day. ? Be aware of how much alcohol is in your drink. In the [place] one drink equals one 12 oz bottle of beer (355 mL), one 5 oz glass of wine (148 mL), or one 1 oz glass of hard liquor (44 mL). Lifestyle  Take daily care of your teeth and gums.  Stay active. Exercise for at least 30 minutes on 5 or more days each week.  Do not use any products that contain nicotine or tobacco, such as cigarettes, e-cigarettes, and chewing tobacco. If you need help quitting, ask your health care provider.  If you are sexually active, practice safe sex. Use a condom or other form of protection in order to prevent STIs (sexually transmitted infections).  Talk with your health care provider about taking a low-dose aspirin or statin. What's next?  Go to your health care provider once a year for a well check visit.  Ask your health care provider how often you should have your eyes and teeth checked.  Stay up to date on all vaccines. This information is not intended to replace advice given to you by your health care provider. Make sure you discuss any questions you have with your health care provider. Document Revised: 03/06/2018 Document Reviewed: 03/06/2018 Elsevier Patient Education  2020 [employer].

## 2020-01-22 NOTE — Progress Notes (Signed)
Established Patient Office Visit     This visit occurred during the SARS-CoV-2 public health emergency.  Safety protocols were in place, including screening questions prior to the visit, additional usage of staff PPE, and extensive cleaning of exam room while observing appropriate contact time as indicated for disinfecting solutions.    CC/Reason for Visit: Annual preventive exam and subsequent Medicare wellness visit  HPI: Shelly Bowers is a 69 y.o. female who is coming in today for the above mentioned reasons. Past Medical History is significant for: Hypertension on hydrochlorothiazide, hyperlipidemia, osteopenia.  I have not seen you in close to 2 years.  She recently had a Covid booster and flu vaccine.  She is due to have her Tdap and shingles vaccination.  She had a negative Cologuard in 2020.  Her last Pap smear was 3 years ago with gynecology.  She is doing well and has no acute complaints.   Past Medical/Surgical History: Past Medical History:  Diagnosis Date  . HTN (hypertension)   . Hyperlipidemia   . Osteopenia     Past Surgical History:  Procedure Laterality Date  . AUGMENTATION MAMMAPLASTY Bilateral   . BREAST EXCISIONAL BIOPSY Left   . INCISION AND DRAINAGE ABSCESS Left 12/27/2015   Procedure: INCISION AND DRAINAGE ABSCESS;  Surgeon: Daryll Brod, MD;  Location: [facility];  Service: Orthopedics;  Laterality: Left;    Social History:  reports that she has never smoked. She has never used smokeless tobacco. She reports current alcohol use. She reports that she does not use drugs.  Allergies: Allergies  Allergen Reactions  . Codeine Nausea Only  . Codeine Sulfate     REACTION: unspecified  . Hydrocodone Nausea And Vomiting    Family History:  Family History  Problem Relation Age of Onset  . Breast cancer Mother 63     Current Outpatient Medications:  .  aspirin EC 81 MG tablet, Take 1 tablet (81 mg total) by mouth daily., Disp: ,  Rfl:  .  atorvastatin (LIPITOR) 10 MG tablet, TAKE ONE TABLET BY MOUTH ONE TIME DAILY, Disp: 90 tablet, Rfl: 0 .  celecoxib (CELEBREX) 200 MG capsule, TAKE 1 CAPSULE BY MOUTH 2 TIMES DAILY AS NEEDED (Patient taking differently: Take 200 mg by mouth daily as needed. GYN), Disp: 90 capsule, Rfl: 1 .  hydrochlorothiazide (HYDRODIURIL) 25 MG tablet, Take 1 tablet (25 mg total) by mouth daily., Disp: 90 tablet, Rfl: 0 .  ibuprofen (ADVIL,MOTRIN) 200 MG tablet, Take 200 mg by mouth every 6 (six) hours as needed.  , Disp: , Rfl:   Review of Systems:  Constitutional: Denies fever, chills, diaphoresis, appetite change and fatigue.  HEENT: Denies photophobia, eye pain, redness, hearing loss, ear pain, congestion, sore throat, rhinorrhea, sneezing, mouth sores, trouble swallowing, neck pain, neck stiffness and tinnitus.   Respiratory: Denies SOB, DOE, cough, chest tightness,  and wheezing.   Cardiovascular: Denies chest pain, palpitations and leg swelling.  Gastrointestinal: Denies nausea, vomiting, abdominal pain, diarrhea, constipation, blood in stool and abdominal distention.  Genitourinary: Denies dysuria, urgency, frequency, hematuria, flank pain and difficulty urinating.  Endocrine: Denies: hot or cold intolerance, sweats, changes in hair or nails, polyuria, polydipsia. Musculoskeletal: Denies myalgias, back pain, joint swelling, arthralgias and gait problem.  Skin: Denies pallor, rash and wound.  Neurological: Denies dizziness, seizures, syncope, weakness, light-headedness, numbness and headaches.  Hematological: Denies adenopathy. Easy bruising, personal or family bleeding history  Psychiatric/Behavioral: Denies suicidal ideation, mood changes, confusion, nervousness, sleep disturbance and  agitation    Physical Exam: Vitals:   01/22/20 1043  BP: 140/80  Pulse: (!) 55  Temp: 98.8 F (37.1 C)  TempSrc: Oral  SpO2: 96%  Weight: 185 lb 11.2 oz (84.2 kg)  Height: $Remove'5\' 5"'zVtyvDV$  (1.651 m)    Body  mass index is 30.9 kg/m.   Constitutional: NAD, calm, comfortable Eyes: PERRL, lids and conjunctivae normal, wears corrective lenses ENMT: Mucous membranes are moist. Posterior pharynx clear of any exudate or lesions. Normal dentition. Tympanic membrane is pearly white, no erythema or bulging. Neck: normal, supple, no masses, no thyromegaly Respiratory: clear to auscultation bilaterally, no wheezing, no crackles. Normal respiratory effort. No accessory muscle use.  Cardiovascular: Regular rate and rhythm, no murmurs / rubs / gallops. No extremity edema. 2+ pedal pulses. No carotid bruits.  Abdomen: no tenderness, no masses palpated. No hepatosplenomegaly. Bowel sounds positive.  Musculoskeletal: no clubbing / cyanosis. No joint deformity upper and lower extremities. Good ROM, no contractures. Normal muscle tone.  Skin: no rashes, lesions, ulcers. No induration Neurologic: CN 2-12 grossly intact. Sensation intact, DTR normal. Strength 5/5 in all 4.  Psychiatric: Normal judgment and insight. Alert and oriented x 3. Normal mood.    Subsequent Medicare wellness visit   1. Risk factors, based on past  M,S,F -cardiovascular disease risk factors include age, history of hyperlipidemia, history of hypertension, mild obesity   2.  Physical activities: Walks on a daily basis with her dog   3.  Depression/mood:  Stable, not depressed   4.  Hearing:  No perceived issues   5.  ADL's: Independent in all ADLs   6.  Fall risk:  Low fall risk   7.  Home safety: No problems identified   8.  Height weight, and visual acuity: height and weight as above, vision:   Visual Acuity Screening   Right eye Left eye Both eyes  Without correction:     With correction: $RemoveBeforeDE'20/20 20/20 20/20 '[id number]$     9.  Counseling:  Advised annual eye exam, advise she update her vaccination status   10. Lab orders based on risk factors: Laboratory update will be reviewed   11. Referral :  None today   12. Care plan:  Follow-up  with me in 6 months   13. Cognitive assessment:  No cognitive impairment   14. Screening: Patient provided with a written and personalized 5-10 year screening schedule in the AVS.   yes   15. Provider List Update:   PCP only  16. Advance Directives: Full code     Office Visit from 01/22/2020 in [facility] at [facility]  PHQ-9 Total Score 0      Fall Risk  01/22/2020 09/22/2019 02/28/2018 08/21/2017 07/25/2016  Falls in the past year? 0 1 0 No No  Number falls in past yr: 0 0 0 - -  Injury with Fall? 0 0 0 - -  Risk for fall due to : - - - - -     Impression and Plan:  Encounter for preventive health examination  -She has routine eye and dental care. -She will update her Tdap and shingles vaccines at the pharmacy. -Screening labs today. -Healthy lifestyle discussed in detail. -She had a negative Cologuard in 2020, redo in 2023. -She has a mammogram scheduled for December. -She will touch base with her GYN for updated cervical cancer screening. -Repeat DEXA scan requested.  Essential hypertension  -Blood pressure is above goal today at 140/80. -She will do ambulatory blood pressure monitoring  and return if consistently above 130/80. -Continue hydrochlorothiazide 25 mg daily.  Dyslipidemia  - Plan: Lipid panel -Last LDL was 170 in 2019.  Osteoarthritis of both hands, unspecified osteoarthritis type -Offered referral to rheumatology, however I do not believe that there are any further options for management other than symptomatic. -She declines referral today.   Patient Instructions  -Nice seeing you today!!  -Lab work today; will notify you once results are available.  -Shingles a Tdap vaccines at your pharmacy.  -Schedule follow up in 6 months.   Preventive Care 21 Years and Older, Female Preventive care refers to lifestyle choices and visits with your health care provider that can promote health and wellness. This includes:  A yearly physical exam.  This is also called an annual well check.  Regular dental and eye exams.  Immunizations.  Screening for certain conditions.  Healthy lifestyle choices, such as diet and exercise. What can I expect for my preventive care visit? Physical exam Your health care provider will check:  Height and weight. These may be used to calculate body mass index (BMI), which is a measurement that tells if you are at a healthy weight.  Heart rate and blood pressure.  Your skin for abnormal spots. Counseling Your health care provider may ask you questions about:  Alcohol, tobacco, and drug use.  Emotional well-being.  Home and relationship well-being.  Sexual activity.  Eating habits.  History of falls.  Memory and ability to understand (cognition).  Work and work Statistician.  Pregnancy and menstrual history. What immunizations do I need?  Influenza (flu) vaccine  This is recommended every year. Tetanus, diphtheria, and pertussis (Tdap) vaccine  You may need a Td booster every 10 years. Varicella (chickenpox) vaccine  You may need this vaccine if you have not already been vaccinated. Zoster (shingles) vaccine  You may need this after age 14. Pneumococcal conjugate (PCV13) vaccine  One dose is recommended after age 62. Pneumococcal polysaccharide (PPSV23) vaccine  One dose is recommended after age 30. Measles, mumps, and rubella (MMR) vaccine  You may need at least one dose of MMR if you were born in 1957 or later. You may also need a second dose. Meningococcal conjugate (MenACWY) vaccine  You may need this if you have certain conditions. Hepatitis A vaccine  You may need this if you have certain conditions or if you travel or work in places where you may be exposed to hepatitis A. Hepatitis B vaccine  You may need this if you have certain conditions or if you travel or work in places where you may be exposed to hepatitis B. Haemophilus influenzae type b (Hib)  vaccine  You may need this if you have certain conditions. You may receive vaccines as individual doses or as more than one vaccine together in one shot (combination vaccines). Talk with your health care provider about the risks and benefits of combination vaccines. What tests do I need? Blood tests  Lipid and cholesterol levels. These may be checked every 5 years, or more frequently depending on your overall health.  Hepatitis C test.  Hepatitis B test. Screening  Lung cancer screening. You may have this screening every year starting at age 21 if you have a 30-pack-year history of smoking and currently smoke or have quit within the past 15 years.  Colorectal cancer screening. All adults should have this screening starting at age 62 and continuing until age 20. Your health care provider may recommend screening at age 75 if you  are at increased risk. You will have tests every 1-10 years, depending on your results and the type of screening test.  Diabetes screening. This is done by checking your blood sugar (glucose) after you have not eaten for a while (fasting). You may have this done every 1-3 years.  Mammogram. This may be done every 1-2 years. Talk with your health care provider about how often you should have regular mammograms.  BRCA-related cancer screening. This may be done if you have a family history of breast, ovarian, tubal, or peritoneal cancers. Other tests  Sexually transmitted disease (STD) testing.  Bone density scan. This is done to screen for osteoporosis. You may have this done starting at age 36. Follow these instructions at home: Eating and drinking  Eat a diet that includes fresh fruits and vegetables, whole grains, lean protein, and low-fat dairy products. Limit your intake of foods with high amounts of sugar, saturated fats, and salt.  Take vitamin and mineral supplements as recommended by your health care provider.  Do not drink alcohol if your health care  provider tells you not to drink.  If you drink alcohol: ? Limit how much you have to 0-1 drink a day. ? Be aware of how much alcohol is in your drink. In the [place] one drink equals one 12 oz bottle of beer (355 mL), one 5 oz glass of wine (148 mL), or one 1 oz glass of hard liquor (44 mL). Lifestyle  Take daily care of your teeth and gums.  Stay active. Exercise for at least 30 minutes on 5 or more days each week.  Do not use any products that contain nicotine or tobacco, such as cigarettes, e-cigarettes, and chewing tobacco. If you need help quitting, ask your health care provider.  If you are sexually active, practice safe sex. Use a condom or other form of protection in order to prevent STIs (sexually transmitted infections).  Talk with your health care provider about taking a low-dose aspirin or statin. What's next?  Go to your health care provider once a year for a well check visit.  Ask your health care provider how often you should have your eyes and teeth checked.  Stay up to date on all vaccines. This information is not intended to replace advice given to you by your health care provider. Make sure you discuss any questions you have with your health care provider. Document Revised: 03/06/2018 Document Reviewed: 03/06/2018 Elsevier Patient Education  2020 [place], [place] [facility]

## 2020-01-23 LAB — COMPREHENSIVE METABOLIC PANEL
AG Ratio: 2 (calc) (ref 1.0–2.5)
ALT: 15 U/L (ref 6–29)
AST: 21 U/L (ref 10–35)
Albumin: 4.6 g/dL (ref 3.6–5.1)
Alkaline phosphatase (APISO): 69 U/L (ref 37–153)
BUN: 13 mg/dL (ref 7–25)
CO2: 28 mmol/L (ref 20–32)
Calcium: 9.8 mg/dL (ref 8.6–10.4)
Chloride: 99 mmol/L (ref 98–110)
Creat: 0.89 mg/dL (ref 0.60–0.93)
Globulin: 2.3 g/dL (calc) (ref 1.9–3.7)
Glucose, Bld: 93 mg/dL (ref 65–99)
Potassium: 4.5 mmol/L (ref 3.5–5.3)
Sodium: 137 mmol/L (ref 135–146)
Total Bilirubin: 1.1 mg/dL (ref 0.2–1.2)
Total Protein: 6.9 g/dL (ref 6.1–8.1)

## 2020-01-23 LAB — CBC WITH DIFFERENTIAL/PLATELET
Absolute Monocytes: 459 cells/uL (ref 200–950)
Basophils Absolute: 22 cells/uL (ref 0–200)
Basophils Relative: 0.4 %
Eosinophils Absolute: 118 cells/uL (ref 15–500)
Eosinophils Relative: 2.1 %
HCT: 40.2 % (ref 35.0–45.0)
Hemoglobin: 13.6 g/dL (ref 11.7–15.5)
Lymphs Abs: 1882 cells/uL (ref 850–3900)
MCH: 34.5 pg — ABNORMAL HIGH (ref 27.0–33.0)
MCHC: 33.8 g/dL (ref 32.0–36.0)
MCV: 102 fL — ABNORMAL HIGH (ref 80.0–100.0)
MPV: 10.9 fL (ref 7.5–12.5)
Monocytes Relative: 8.2 %
Neutro Abs: 3119 cells/uL (ref 1500–7800)
Neutrophils Relative %: 55.7 %
Platelets: 190 10*3/uL (ref 140–400)
RBC: 3.94 10*6/uL (ref 3.80–5.10)
RDW: 12.6 % (ref 11.0–15.0)
Total Lymphocyte: 33.6 %
WBC: 5.6 10*3/uL (ref 3.8–10.8)

## 2020-01-23 LAB — LIPID PANEL
Cholesterol: 219 mg/dL — ABNORMAL HIGH (ref <200)
HDL: 71 mg/dL (ref >=50)
LDL Cholesterol (Calc): 125 mg/dL (calc) — ABNORMAL HIGH
Non-HDL Cholesterol (Calc): 148 mg/dL (calc) — ABNORMAL HIGH (ref <130)
Total CHOL/HDL Ratio: 3.1 (calc) (ref <5.0)
Triglycerides: 115 mg/dL (ref <150)

## 2020-01-23 LAB — TSH: TSH: 1.61 mIU/L (ref 0.40–4.50)

## 2020-01-23 LAB — VITAMIN B12: Vitamin B-12: 364 pg/mL (ref 200–1100)

## 2020-01-23 LAB — VITAMIN D 25 HYDROXY (VIT D DEFICIENCY, FRACTURES): Vit D, 25-Hydroxy: 31 ng/mL (ref 30–100)

## 2020-01-26 ENCOUNTER — Encounter: Payer: Self-pay | Admitting: Internal Medicine

## 2020-01-26 ENCOUNTER — Other Ambulatory Visit: Payer: Self-pay | Admitting: Internal Medicine

## 2020-01-26 DIAGNOSIS — E559 Vitamin D deficiency, unspecified: Secondary | ICD-10-CM

## 2020-01-26 DIAGNOSIS — D7589 Other specified diseases of blood and blood-forming organs: Secondary | ICD-10-CM | POA: Insufficient documentation

## 2020-01-26 MED ORDER — VITAMIN D (ERGOCALCIFEROL) 1.25 MG (50000 UNIT) PO CAPS: 50000.0000 [IU] | ORAL_CAPSULE | ORAL | 0 refills | Status: AC

## 2020-01-28 ENCOUNTER — Other Ambulatory Visit: Payer: Self-pay | Admitting: Internal Medicine

## 2020-01-28 DIAGNOSIS — E559 Vitamin D deficiency, unspecified: Secondary | ICD-10-CM

## 2020-01-28 DIAGNOSIS — D7589 Other specified diseases of blood and blood-forming organs: Secondary | ICD-10-CM

## 2020-02-09 DIAGNOSIS — R69 Illness, unspecified: Secondary | ICD-10-CM | POA: Diagnosis not present

## 2020-02-22 ENCOUNTER — Other Ambulatory Visit: Payer: Self-pay | Admitting: Internal Medicine

## 2020-02-22 DIAGNOSIS — E785 Hyperlipidemia, unspecified: Secondary | ICD-10-CM

## 2020-03-01 ENCOUNTER — Ambulatory Visit
Admission: RE | Admit: 2020-03-01 | Discharge: 2020-03-01 | Disposition: A | Discharge status: Home / Self Care | Payer: Medicare HMO | Source: Ambulatory Visit | Attending: Internal Medicine | Admitting: Internal Medicine

## 2020-03-01 ENCOUNTER — Other Ambulatory Visit: Payer: Self-pay

## 2020-03-01 DIAGNOSIS — Z1231 Encounter for screening mammogram for malignant neoplasm of breast: Secondary | ICD-10-CM

## 2020-03-04 LAB — HM MAMMOGRAPHY: HM Mammogram: NORMAL (ref 0–4)

## 2020-03-13 ENCOUNTER — Other Ambulatory Visit: Payer: Self-pay | Admitting: Internal Medicine

## 2020-04-26 ENCOUNTER — Other Ambulatory Visit: Payer: Self-pay | Admitting: Internal Medicine

## 2020-04-26 DIAGNOSIS — Z13820 Encounter for screening for osteoporosis: Secondary | ICD-10-CM

## 2020-04-29 NOTE — Addendum Note (Signed)
Addended by: Marrion Coy on: 04/29/2020 03:28 PM   Modules accepted: Orders

## 2020-05-02 ENCOUNTER — Other Ambulatory Visit: Payer: Self-pay

## 2020-05-03 ENCOUNTER — Other Ambulatory Visit: Payer: Self-pay

## 2020-05-03 ENCOUNTER — Other Ambulatory Visit (INDEPENDENT_AMBULATORY_CARE_PROVIDER_SITE_OTHER): Payer: Medicare HMO

## 2020-05-03 DIAGNOSIS — E559 Vitamin D deficiency, unspecified: Secondary | ICD-10-CM | POA: Diagnosis not present

## 2020-05-03 DIAGNOSIS — D7589 Other specified diseases of blood and blood-forming organs: Secondary | ICD-10-CM

## 2020-05-04 ENCOUNTER — Other Ambulatory Visit: Payer: Self-pay | Admitting: Internal Medicine

## 2020-05-04 DIAGNOSIS — E559 Vitamin D deficiency, unspecified: Secondary | ICD-10-CM

## 2020-05-04 LAB — CBC WITH DIFFERENTIAL/PLATELET
Basophils Absolute: 0.1 10*3/uL (ref 0.0–0.1)
Basophils Relative: 1 % (ref 0.0–3.0)
Eosinophils Absolute: 0.1 10*3/uL (ref 0.0–0.7)
Eosinophils Relative: 1.3 % (ref 0.0–5.0)
HCT: 38.7 % (ref 36.0–46.0)
Hemoglobin: 13 g/dL (ref 12.0–15.0)
Lymphocytes Relative: 29.3 % (ref 12.0–46.0)
Lymphs Abs: 2.1 10*3/uL (ref 0.7–4.0)
MCHC: 33.5 g/dL (ref 30.0–36.0)
MCV: 101.8 fl — ABNORMAL HIGH (ref 78.0–100.0)
Monocytes Absolute: 0.5 10*3/uL (ref 0.1–1.0)
Monocytes Relative: 7.4 % (ref 3.0–12.0)
Neutro Abs: 4.4 10*3/uL (ref 1.4–7.7)
Neutrophils Relative %: 61 % (ref 43.0–77.0)
Platelets: 186 10*3/uL (ref 150.0–400.0)
RBC: 3.8 Mil/uL — ABNORMAL LOW (ref 3.87–5.11)
RDW: 14 % (ref 11.5–15.5)
WBC: 7.2 10*3/uL (ref 4.0–10.5)

## 2020-05-04 LAB — VITAMIN D 25 HYDROXY (VIT D DEFICIENCY, FRACTURES): VITD: 27.9 ng/mL — ABNORMAL LOW (ref 30.00–100.00)

## 2020-05-04 MED ORDER — VITAMIN D (ERGOCALCIFEROL) 1.25 MG (50000 UNIT) PO CAPS: 50000.0000 [IU] | ORAL_CAPSULE | ORAL | 0 refills | Status: AC

## 2020-05-06 ENCOUNTER — Other Ambulatory Visit: Payer: Self-pay | Admitting: Internal Medicine

## 2020-05-06 DIAGNOSIS — E559 Vitamin D deficiency, unspecified: Secondary | ICD-10-CM

## 2020-05-26 DIAGNOSIS — H5203 Hypermetropia, bilateral: Secondary | ICD-10-CM | POA: Diagnosis not present

## 2020-07-04 DIAGNOSIS — Z0100 Encounter for examination of eyes and vision without abnormal findings: Secondary | ICD-10-CM | POA: Diagnosis not present

## 2020-08-01 ENCOUNTER — Other Ambulatory Visit: Payer: Self-pay

## 2020-08-02 ENCOUNTER — Other Ambulatory Visit (INDEPENDENT_AMBULATORY_CARE_PROVIDER_SITE_OTHER): Payer: Medicare HMO

## 2020-08-02 DIAGNOSIS — E559 Vitamin D deficiency, unspecified: Secondary | ICD-10-CM

## 2020-08-02 LAB — VITAMIN D 25 HYDROXY (VIT D DEFICIENCY, FRACTURES): VITD: 30.82 ng/mL (ref 30.00–100.00)

## 2020-08-04 ENCOUNTER — Other Ambulatory Visit: Payer: Self-pay | Admitting: Internal Medicine

## 2020-08-04 DIAGNOSIS — E559 Vitamin D deficiency, unspecified: Secondary | ICD-10-CM

## 2020-08-04 MED ORDER — VITAMIN D (ERGOCALCIFEROL) 1.25 MG (50000 UNIT) PO CAPS: 50000.0000 [IU] | ORAL_CAPSULE | ORAL | 0 refills | Status: AC

## 2020-08-15 ENCOUNTER — Encounter: Payer: Self-pay | Admitting: Internal Medicine

## 2020-08-16 DIAGNOSIS — Z01419 Encounter for gynecological examination (general) (routine) without abnormal findings: Secondary | ICD-10-CM | POA: Diagnosis not present

## 2020-08-16 DIAGNOSIS — Z6829 Body mass index (BMI) 29.0-29.9, adult: Secondary | ICD-10-CM | POA: Diagnosis not present

## 2020-08-16 DIAGNOSIS — Z124 Encounter for screening for malignant neoplasm of cervix: Secondary | ICD-10-CM | POA: Diagnosis not present

## 2020-08-17 ENCOUNTER — Encounter: Payer: Self-pay | Admitting: Internal Medicine

## 2020-08-19 ENCOUNTER — Encounter: Payer: Self-pay | Admitting: Internal Medicine

## 2020-08-19 LAB — HM PAP SMEAR

## 2020-08-21 ENCOUNTER — Other Ambulatory Visit: Payer: Self-pay | Admitting: Internal Medicine

## 2020-08-21 DIAGNOSIS — E785 Hyperlipidemia, unspecified: Secondary | ICD-10-CM

## 2020-08-23 ENCOUNTER — Encounter (HOSPITAL_COMMUNITY): Payer: Self-pay

## 2020-08-23 ENCOUNTER — Inpatient Hospital Stay (HOSPITAL_COMMUNITY)
Admission: EM | Admit: 2020-08-23 | Discharge: 2020-08-26 | DRG: 308 | Disposition: A | Discharge status: Home / Self Care | Payer: Medicare HMO | Attending: Internal Medicine | Admitting: Internal Medicine

## 2020-08-23 ENCOUNTER — Emergency Department (HOSPITAL_COMMUNITY): Payer: Medicare HMO

## 2020-08-23 DIAGNOSIS — I480 Paroxysmal atrial fibrillation: Principal | ICD-10-CM | POA: Diagnosis present

## 2020-08-23 DIAGNOSIS — R21 Rash and other nonspecific skin eruption: Secondary | ICD-10-CM | POA: Diagnosis not present

## 2020-08-23 DIAGNOSIS — R0602 Shortness of breath: Secondary | ICD-10-CM | POA: Diagnosis not present

## 2020-08-23 DIAGNOSIS — I110 Hypertensive heart disease with heart failure: Secondary | ICD-10-CM | POA: Diagnosis present

## 2020-08-23 DIAGNOSIS — Z20828 Contact with and (suspected) exposure to other viral communicable diseases: Secondary | ICD-10-CM | POA: Diagnosis not present

## 2020-08-23 DIAGNOSIS — Z20822 Contact with and (suspected) exposure to covid-19: Secondary | ICD-10-CM | POA: Diagnosis not present

## 2020-08-23 DIAGNOSIS — R0902 Hypoxemia: Secondary | ICD-10-CM | POA: Diagnosis not present

## 2020-08-23 DIAGNOSIS — Z03818 Encounter for observation for suspected exposure to other biological agents ruled out: Secondary | ICD-10-CM | POA: Diagnosis not present

## 2020-08-23 DIAGNOSIS — D7589 Other specified diseases of blood and blood-forming organs: Secondary | ICD-10-CM | POA: Diagnosis present

## 2020-08-23 DIAGNOSIS — I5021 Acute systolic (congestive) heart failure: Secondary | ICD-10-CM | POA: Diagnosis present

## 2020-08-23 DIAGNOSIS — M1990 Unspecified osteoarthritis, unspecified site: Secondary | ICD-10-CM | POA: Diagnosis present

## 2020-08-23 DIAGNOSIS — I499 Cardiac arrhythmia, unspecified: Secondary | ICD-10-CM | POA: Diagnosis not present

## 2020-08-23 DIAGNOSIS — Z885 Allergy status to narcotic agent status: Secondary | ICD-10-CM

## 2020-08-23 DIAGNOSIS — G4489 Other headache syndrome: Secondary | ICD-10-CM | POA: Diagnosis not present

## 2020-08-23 DIAGNOSIS — E785 Hyperlipidemia, unspecified: Secondary | ICD-10-CM | POA: Diagnosis present

## 2020-08-23 DIAGNOSIS — I4819 Other persistent atrial fibrillation: Secondary | ICD-10-CM | POA: Diagnosis present

## 2020-08-23 DIAGNOSIS — J9811 Atelectasis: Secondary | ICD-10-CM | POA: Diagnosis not present

## 2020-08-23 DIAGNOSIS — E871 Hypo-osmolality and hyponatremia: Secondary | ICD-10-CM | POA: Diagnosis not present

## 2020-08-23 DIAGNOSIS — I4891 Unspecified atrial fibrillation: Secondary | ICD-10-CM | POA: Diagnosis not present

## 2020-08-23 DIAGNOSIS — I517 Cardiomegaly: Secondary | ICD-10-CM | POA: Diagnosis not present

## 2020-08-23 DIAGNOSIS — I081 Rheumatic disorders of both mitral and tricuspid valves: Secondary | ICD-10-CM | POA: Diagnosis present

## 2020-08-23 DIAGNOSIS — Z803 Family history of malignant neoplasm of breast: Secondary | ICD-10-CM

## 2020-08-23 DIAGNOSIS — Z79899 Other long term (current) drug therapy: Secondary | ICD-10-CM

## 2020-08-23 DIAGNOSIS — E876 Hypokalemia: Secondary | ICD-10-CM | POA: Diagnosis present

## 2020-08-23 DIAGNOSIS — I10 Essential (primary) hypertension: Secondary | ICD-10-CM | POA: Diagnosis present

## 2020-08-23 DIAGNOSIS — R000 Tachycardia, unspecified: Secondary | ICD-10-CM | POA: Diagnosis not present

## 2020-08-23 DIAGNOSIS — I429 Cardiomyopathy, unspecified: Secondary | ICD-10-CM | POA: Diagnosis present

## 2020-08-23 DIAGNOSIS — Z7982 Long term (current) use of aspirin: Secondary | ICD-10-CM

## 2020-08-23 LAB — TSH: TSH: 2.304 u[IU]/mL (ref 0.350–4.500)

## 2020-08-23 LAB — COMPREHENSIVE METABOLIC PANEL
ALT: 42 U/L (ref 0–44)
AST: 37 U/L (ref 15–41)
Albumin: 4.5 g/dL (ref 3.5–5.0)
Alkaline Phosphatase: 84 U/L (ref 38–126)
Anion gap: 10 (ref 5–15)
BUN: 17 mg/dL (ref 8–23)
CO2: 27 mmol/L (ref 22–32)
Calcium: 9.5 mg/dL (ref 8.9–10.3)
Chloride: 100 mmol/L (ref 98–111)
Creatinine, Ser: 0.75 mg/dL (ref 0.44–1.00)
GFR, Estimated: >60 mL/min (ref >60)
Glucose, Bld: 111 mg/dL — ABNORMAL HIGH (ref 70–99)
Potassium: 3.5 mmol/L (ref 3.5–5.1)
Sodium: 137 mmol/L (ref 135–145)
Total Bilirubin: 1.2 mg/dL (ref 0.3–1.2)
Total Protein: 6.9 g/dL (ref 6.5–8.1)

## 2020-08-23 LAB — CBC WITH DIFFERENTIAL/PLATELET
Abs Immature Granulocytes: 0.04 10*3/uL (ref 0.00–0.07)
Basophils Absolute: 0 10*3/uL (ref 0.0–0.1)
Basophils Relative: 0 %
Eosinophils Absolute: 0.1 10*3/uL (ref 0.0–0.5)
Eosinophils Relative: 2 %
HCT: 39.2 % (ref 36.0–46.0)
Hemoglobin: 12.7 g/dL (ref 12.0–15.0)
Immature Granulocytes: 1 %
Lymphocytes Relative: 26 %
Lymphs Abs: 1.7 10*3/uL (ref 0.7–4.0)
MCH: 34.5 pg — ABNORMAL HIGH (ref 26.0–34.0)
MCHC: 32.4 g/dL (ref 30.0–36.0)
MCV: 106.5 fL — ABNORMAL HIGH (ref 80.0–100.0)
Monocytes Absolute: 0.6 10*3/uL (ref 0.1–1.0)
Monocytes Relative: 9 %
Neutro Abs: 4.1 10*3/uL (ref 1.7–7.7)
Neutrophils Relative %: 62 %
Platelets: 197 10*3/uL (ref 150–400)
RBC: 3.68 MIL/uL — ABNORMAL LOW (ref 3.87–5.11)
RDW: 13.6 % (ref 11.5–15.5)
WBC: 6.6 10*3/uL (ref 4.0–10.5)
nRBC: 0 % (ref 0.0–0.2)

## 2020-08-23 LAB — RESP PANEL BY RT-PCR (FLU A&B, COVID) ARPGX2
Influenza A by PCR: NEGATIVE
Influenza B by PCR: NEGATIVE
SARS Coronavirus 2 by RT PCR: NEGATIVE

## 2020-08-23 LAB — TROPONIN I (HIGH SENSITIVITY)
Troponin I (High Sensitivity): 10 ng/L (ref <18)
Troponin I (High Sensitivity): 11 ng/L (ref <18)

## 2020-08-23 LAB — BRAIN NATRIURETIC PEPTIDE: B Natriuretic Peptide: 370.5 pg/mL — ABNORMAL HIGH (ref 0.0–100.0)

## 2020-08-23 LAB — MAGNESIUM: Magnesium: 2 mg/dL (ref 1.7–2.4)

## 2020-08-23 MED ORDER — DILTIAZEM HCL 30 MG PO TABS
60.0000 mg | ORAL_TABLET | Freq: Once | ORAL | Status: AC
  Administered 2020-08-23: 60 mg via ORAL
  Filled 2020-08-23: qty 2

## 2020-08-23 MED ORDER — DILTIAZEM HCL 25 MG/5ML IV SOLN
20.0000 mg | Freq: Once | INTRAVENOUS | Status: AC
  Administered 2020-08-23: 20 mg via INTRAVENOUS
  Filled 2020-08-23: qty 5

## 2020-08-23 MED ORDER — APIXABAN 5 MG PO TABS
5.0000 mg | ORAL_TABLET | Freq: Two times a day (BID) | ORAL | Status: DC
  Administered 2020-08-24 – 2020-08-26 (×6): 5 mg via ORAL
  Filled 2020-08-23 (×6): qty 1

## 2020-08-23 MED ORDER — SODIUM CHLORIDE 0.9 % IV BOLUS
500.0000 mL | Freq: Once | INTRAVENOUS | Status: AC
  Administered 2020-08-23: 500 mL via INTRAVENOUS

## 2020-08-23 NOTE — ED Provider Notes (Addendum)
[facility] DEPT Provider Note   CSN: [id number] Arrival date & time: 08/23/20  1813     History Chief Complaint  Patient presents with  . Shortness of Breath    Shelly Bowers is a 70 y.o. female.  HPI   70 year old female past medical history of HTN, HLD presents the emergency department with heart fluttering.  Patient states for about the past month she is been having off-and-on episodes of fluttering.  There is been intermittent days where she is felt very fatigued and under the weather.  Recently she started having some chest congestion and shortness of breath that she thought was an upper respiratory infection.  She is been tested COVID-negative.  When she went to urgent care today to have another COVID test they noted that her heart rate was fast.  When EMS picked her up she was noted to be in A. fib with RVR rates up into the 170s, stable blood pressure.  She was given a dose of Cardizem IV prior to arrival with improvement.  On my evaluation patient has no active chest pain or shortness of breath.  She denies any swelling of her lower extremities.  No previous cardiac history, has not seen a cardiologist.  Past Medical History:  Diagnosis Date  . HTN (hypertension)   . Hyperlipidemia   . Osteopenia     Patient Active Problem List   Diagnosis Date Noted  . 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 Vitamin D deficiency 01/26/2020  . SCC (squamous cell carcinoma) 04/25/2018  . Hyperlipidemia 02/28/2018  . Osteopenia 02/28/2018  . Essential hypertension 10/02/2017  . Chronic sciatica 08/21/2017  . Menopausal symptom 08/21/2017  . Granuloma of skin 01/04/2016  . Dyslipidemia 04/20/2013  . Encounter for preventive health examination 03/15/2011  . HIP PAIN, LEFT 10/28/2008    Past Surgical History:  Procedure Laterality Date  . AUGMENTATION MAMMAPLASTY Bilateral   . BREAST EXCISIONAL BIOPSY Left   . INCISION AND DRAINAGE ABSCESS Left  12/27/2015   Procedure: INCISION AND DRAINAGE ABSCESS;  Surgeon: Daryll Brod, MD;  Location: [facility];  Service: Orthopedics;  Laterality: Left;     OB History   No obstetric history on file.     Family History  Problem Relation Age of Onset  . Breast cancer Mother 75    Social History   Tobacco Use  . Smoking status: Never Smoker  . Smokeless tobacco: Never Used  Substance Use Topics  . Alcohol use: Yes    Comment: occ  . Drug use: No    Home Medications Prior to Admission medications   Medication Sig Start Date End Date Taking? Authorizing Provider  aspirin EC 81 MG tablet Take 1 tablet (81 mg total) by mouth daily. 02/28/18   Isaac Bliss, Rayford Halsted, MD  atorvastatin (LIPITOR) 10 MG tablet TAKE ONE TABLET BY MOUTH ONE TIME DAILY 08/23/20   Isaac Bliss, Rayford Halsted, MD  celecoxib (CELEBREX) 200 MG capsule TAKE 1 CAPSULE BY MOUTH 2 TIMES DAILY AS NEEDED Patient taking differently: Take 200 mg by mouth daily as needed. GYN 05/01/17   Marletta Lor, MD  hydrochlorothiazide (HYDRODIURIL) 25 MG tablet TAKE ONE TABLET BY MOUTH ONE TIME DAILY 03/22/20   Isaac Bliss, Rayford Halsted, MD  ibuprofen (ADVIL,MOTRIN) 200 MG tablet Take 200 mg by mouth every 6 (six) hours as needed.      [provider]  Vitamin D, Ergocalciferol, (DRISDOL) 1.25 MG (50000 UNIT) CAPS capsule Take 1 capsule (50,000  Units total) by mouth every 7 (seven) days for 12 doses. 08/04/20 10/21/20  Erline Hau, MD    Allergies    Codeine, Codeine sulfate, and Hydrocodone  Review of Systems   Review of Systems  Constitutional: Positive for fatigue. Negative for chills and fever.  HENT: Positive for congestion.   Eyes: Negative for visual disturbance.  Respiratory: Positive for cough. Negative for shortness of breath.   Cardiovascular: Positive for palpitations. Negative for chest pain and leg swelling.  Gastrointestinal: Negative for abdominal pain, diarrhea and  vomiting.  Genitourinary: Negative for dysuria.  Skin: Negative for rash.  Neurological: Negative for headaches.    Physical Exam Updated Vital Signs BP (!) 142/86   Pulse (!) 103   Temp 98.2 F (36.8 C) (Oral)   Resp 19   Ht 5\' 6"  (1.676 m)   Wt 83.9 kg   SpO2 94%   BMI 29.86 kg/m   Physical Exam Vitals and nursing note reviewed.  Constitutional:      General: She is not in acute distress.    Appearance: Normal appearance. She is not toxic-appearing or diaphoretic.  HENT:     Head: Normocephalic.     Mouth/Throat:     Mouth: Mucous membranes are moist.  Cardiovascular:     Rate and Rhythm: Tachycardia present. Rhythm irregular.  Pulmonary:     Effort: Pulmonary effort is normal. No respiratory distress.  Abdominal:     Palpations: Abdomen is soft.     Tenderness: There is no abdominal tenderness.  Musculoskeletal:     Right lower leg: No edema.     Left lower leg: No edema.  Skin:    General: Skin is warm.  Neurological:     Mental Status: She is alert and oriented to person, place, and time. Mental status is at baseline.  Psychiatric:        Mood and Affect: Mood normal.     ED Results / Procedures / Treatments   Labs (all labs ordered are listed, but only abnormal results are displayed) Labs Reviewed  COMPREHENSIVE METABOLIC PANEL - Abnormal; Notable for the following components:      Result Value   Glucose, Bld 111 (*)    All other components within normal limits  CBC WITH DIFFERENTIAL/PLATELET - Abnormal; Notable for the following components:   RBC 3.68 (*)    MCV 106.5 (*)    MCH 34.5 (*)    All other components within normal limits  RESP PANEL BY RT-PCR (FLU A&B, COVID) ARPGX2  BRAIN NATRIURETIC PEPTIDE  MAGNESIUM  TROPONIN I (HIGH SENSITIVITY)    EKG EKG Interpretation  Date/Time:  Tuesday Aug 23 2020 18:28:40 EDT Ventricular Rate:  134 PR Interval:    QRS Duration: 87 QT Interval:  334 QTC Calculation: 499 R Axis:   -5 Text  Interpretation: Atrial fibrillation Consider anterior infarct Afib, no STEMI Confirmed by Lavenia Atlas [phone]) on 08/23/2020 7:10:58 PM   Radiology DG Chest 2 View  Result Date: 08/23/2020 CLINICAL DATA:  70 year old female with tachycardia. EXAM: CHEST - 2 VIEW COMPARISON:  Chest radiograph dated 05/15/2016. FINDINGS: There is cardiomegaly with vascular congestion. No focal consolidation, pleural effusion, or pneumothorax. Bibasilar atelectasis. Degenerative changes of the spine. No acute osseous pathology. IMPRESSION: Cardiomegaly with vascular congestion. No focal consolidation. Electronically Signed   By: Anner Crete M.D.   On: 08/23/2020 20:42    Procedures .Critical Care Performed by: Lorelle Gibbs, DO Authorized by: Lorelle Gibbs, DO  Critical care provider statement:    Critical care time (minutes):  45   Critical care was time spent personally by me on the following activities:  Discussions with consultants, evaluation of patient's response to treatment, examination of patient, ordering and performing treatments and interventions, ordering and review of laboratory studies, ordering and review of radiographic studies, pulse oximetry, re-evaluation of patient's condition, obtaining history from patient or surrogate and review of old charts     Medications Ordered in ED Medications  diltiazem (CARDIZEM) injection 20 mg (has no administration in time range)  sodium chloride 0.9 % bolus 500 mL (has no administration in time range)    ED Course  I have reviewed the triage vital signs and the nursing notes.  Pertinent labs & imaging results that were available during my care of the patient were reviewed by me and considered in my medical decision making (see chart for details).    MDM Rules/Calculators/A&P                          70 year old female presents for new onset afib with rvr.  Received a dose of Cardizem IV prior to arrival.  On my evaluation patient has  irregular heart rate with rates 120s to 130s.  No active chest pain or shortness of breath.  No hypoxia or respiratory distress.  EKG confirms atrial fibrillation.  CBC and CMP unremarkable. CXR does show pulmonary congestion. FLU and COVID negative.   Troponins are negative, BNP is slightly elevated.  Electrolytes, magnesium and TSH are normal.  Patient responded well to IV dose of Cardizem was given a p.o. dose.  Currently rate is controlled in the 80s, stable blood pressure, she feels well.  Patients evaluation and results requires admission for further treatment and care for new onset A. fib. Patient agrees with admission plan, offers no new complaints and is stable/unchanged at time of admit.  Final Clinical Impression(s) / ED Diagnoses Final diagnoses:  None    Rx / DC Orders ED Discharge Orders    None       Lorelle Gibbs, DO 08/23/20 [place], [place], DO 08/23/20 2330

## 2020-08-23 NOTE — ED Triage Notes (Signed)
Pt biba from urgent care , found to be in afib rvr 140-170 hr, given 10mg  cartizem by medic with improvement. Pt c/o sob, cold symptoms and fatigue x 6 days. No hx of afib.

## 2020-08-23 NOTE — ED Notes (Signed)
Patient ambulated to the bathroom.

## 2020-08-23 NOTE — ED Notes (Signed)
Back from bathroom

## 2020-08-24 ENCOUNTER — Observation Stay (HOSPITAL_COMMUNITY): Payer: Medicare HMO

## 2020-08-24 ENCOUNTER — Encounter (HOSPITAL_COMMUNITY): Payer: Self-pay | Admitting: Internal Medicine

## 2020-08-24 ENCOUNTER — Other Ambulatory Visit: Payer: Self-pay

## 2020-08-24 DIAGNOSIS — R0602 Shortness of breath: Secondary | ICD-10-CM | POA: Diagnosis not present

## 2020-08-24 DIAGNOSIS — I4891 Unspecified atrial fibrillation: Secondary | ICD-10-CM

## 2020-08-24 DIAGNOSIS — I429 Cardiomyopathy, unspecified: Secondary | ICD-10-CM | POA: Diagnosis not present

## 2020-08-24 DIAGNOSIS — Q211 Atrial septal defect: Secondary | ICD-10-CM | POA: Diagnosis not present

## 2020-08-24 DIAGNOSIS — M1990 Unspecified osteoarthritis, unspecified site: Secondary | ICD-10-CM | POA: Diagnosis present

## 2020-08-24 DIAGNOSIS — Z79899 Other long term (current) drug therapy: Secondary | ICD-10-CM | POA: Diagnosis not present

## 2020-08-24 DIAGNOSIS — I4819 Other persistent atrial fibrillation: Secondary | ICD-10-CM | POA: Diagnosis present

## 2020-08-24 DIAGNOSIS — E559 Vitamin D deficiency, unspecified: Secondary | ICD-10-CM | POA: Diagnosis not present

## 2020-08-24 DIAGNOSIS — I081 Rheumatic disorders of both mitral and tricuspid valves: Secondary | ICD-10-CM | POA: Diagnosis not present

## 2020-08-24 DIAGNOSIS — E785 Hyperlipidemia, unspecified: Secondary | ICD-10-CM | POA: Diagnosis not present

## 2020-08-24 DIAGNOSIS — Z7982 Long term (current) use of aspirin: Secondary | ICD-10-CM | POA: Diagnosis not present

## 2020-08-24 DIAGNOSIS — I313 Pericardial effusion (noninflammatory): Secondary | ICD-10-CM | POA: Diagnosis not present

## 2020-08-24 DIAGNOSIS — I5021 Acute systolic (congestive) heart failure: Secondary | ICD-10-CM | POA: Diagnosis not present

## 2020-08-24 DIAGNOSIS — I5031 Acute diastolic (congestive) heart failure: Secondary | ICD-10-CM | POA: Diagnosis not present

## 2020-08-24 DIAGNOSIS — I340 Nonrheumatic mitral (valve) insufficiency: Secondary | ICD-10-CM | POA: Diagnosis not present

## 2020-08-24 DIAGNOSIS — Z20822 Contact with and (suspected) exposure to covid-19: Secondary | ICD-10-CM | POA: Diagnosis not present

## 2020-08-24 DIAGNOSIS — Z885 Allergy status to narcotic agent status: Secondary | ICD-10-CM | POA: Diagnosis not present

## 2020-08-24 DIAGNOSIS — I110 Hypertensive heart disease with heart failure: Secondary | ICD-10-CM | POA: Diagnosis not present

## 2020-08-24 DIAGNOSIS — I10 Essential (primary) hypertension: Secondary | ICD-10-CM | POA: Diagnosis not present

## 2020-08-24 DIAGNOSIS — Z803 Family history of malignant neoplasm of breast: Secondary | ICD-10-CM | POA: Diagnosis not present

## 2020-08-24 DIAGNOSIS — I083 Combined rheumatic disorders of mitral, aortic and tricuspid valves: Secondary | ICD-10-CM | POA: Diagnosis not present

## 2020-08-24 DIAGNOSIS — E876 Hypokalemia: Secondary | ICD-10-CM | POA: Diagnosis not present

## 2020-08-24 DIAGNOSIS — I480 Paroxysmal atrial fibrillation: Secondary | ICD-10-CM | POA: Diagnosis not present

## 2020-08-24 DIAGNOSIS — D7589 Other specified diseases of blood and blood-forming organs: Secondary | ICD-10-CM | POA: Diagnosis not present

## 2020-08-24 DIAGNOSIS — E871 Hypo-osmolality and hyponatremia: Secondary | ICD-10-CM | POA: Diagnosis not present

## 2020-08-24 LAB — BASIC METABOLIC PANEL
Anion gap: 12 (ref 5–15)
BUN: 16 mg/dL (ref 8–23)
CO2: 25 mmol/L (ref 22–32)
Calcium: 9.1 mg/dL (ref 8.9–10.3)
Chloride: 99 mmol/L (ref 98–111)
Creatinine, Ser: 0.82 mg/dL (ref 0.44–1.00)
GFR, Estimated: >60 mL/min (ref >60)
Glucose, Bld: 110 mg/dL — ABNORMAL HIGH (ref 70–99)
Potassium: 3.3 mmol/L — ABNORMAL LOW (ref 3.5–5.1)
Sodium: 136 mmol/L (ref 135–145)

## 2020-08-24 LAB — CBC
HCT: 36.2 % (ref 36.0–46.0)
Hemoglobin: 11.9 g/dL — ABNORMAL LOW (ref 12.0–15.0)
MCH: 34.7 pg — ABNORMAL HIGH (ref 26.0–34.0)
MCHC: 32.9 g/dL (ref 30.0–36.0)
MCV: 105.5 fL — ABNORMAL HIGH (ref 80.0–100.0)
Platelets: 180 10*3/uL (ref 150–400)
RBC: 3.43 MIL/uL — ABNORMAL LOW (ref 3.87–5.11)
RDW: 13.7 % (ref 11.5–15.5)
WBC: 6.5 10*3/uL (ref 4.0–10.5)
nRBC: 0 % (ref 0.0–0.2)

## 2020-08-24 LAB — ECHOCARDIOGRAM COMPLETE
Area-P 1/2: 6.29 cm2
Calc EF: 35.8 %
Height: 66 in
S' Lateral: 3.35 cm
Single Plane A2C EF: 33.9 %
Single Plane A4C EF: 36 %
Weight: 2960 oz

## 2020-08-24 LAB — D-DIMER, QUANTITATIVE: D-Dimer, Quant: 1.73 ug/mL-FEU — ABNORMAL HIGH (ref 0.00–0.50)

## 2020-08-24 MED ORDER — METOPROLOL TARTRATE 25 MG PO TABS
25.0000 mg | ORAL_TABLET | Freq: Two times a day (BID) | ORAL | Status: DC
  Administered 2020-08-24 (×2): 25 mg via ORAL
  Filled 2020-08-24 (×2): qty 1

## 2020-08-24 MED ORDER — DILTIAZEM HCL-DEXTROSE 125-5 MG/125ML-% IV SOLN (PREMIX)
5.0000 mg/h | INTRAVENOUS | Status: DC
  Administered 2020-08-24: 10 mg/h via INTRAVENOUS
  Administered 2020-08-24: 5 mg/h via INTRAVENOUS
  Filled 2020-08-24 (×3): qty 125

## 2020-08-24 MED ORDER — GUAIFENESIN-DM 100-10 MG/5ML PO SYRP
10.0000 mL | ORAL_SOLUTION | ORAL | Status: DC | PRN
  Administered 2020-08-25: 10 mL via ORAL
  Filled 2020-08-24: qty 10

## 2020-08-24 MED ORDER — ATORVASTATIN CALCIUM 10 MG PO TABS
10.0000 mg | ORAL_TABLET | Freq: Every day | ORAL | Status: DC
  Administered 2020-08-24 – 2020-08-26 (×3): 10 mg via ORAL
  Filled 2020-08-24 (×3): qty 1

## 2020-08-24 MED ORDER — FUROSEMIDE 10 MG/ML IJ SOLN
40.0000 mg | Freq: Once | INTRAMUSCULAR | Status: AC
  Administered 2020-08-24: 40 mg via INTRAVENOUS
  Filled 2020-08-24: qty 4

## 2020-08-24 MED ORDER — POTASSIUM CHLORIDE CRYS ER 20 MEQ PO TBCR
40.0000 meq | EXTENDED_RELEASE_TABLET | Freq: Once | ORAL | Status: AC
  Administered 2020-08-24: 40 meq via ORAL
  Filled 2020-08-24: qty 2

## 2020-08-24 MED ORDER — PERFLUTREN LIPID MICROSPHERE
1.0000 mL | INTRAVENOUS | Status: AC | PRN
  Administered 2020-08-24: 2 mL via INTRAVENOUS
  Filled 2020-08-24: qty 10

## 2020-08-24 MED ORDER — METOPROLOL TARTRATE 25 MG PO TABS
25.0000 mg | ORAL_TABLET | Freq: Three times a day (TID) | ORAL | Status: DC
  Administered 2020-08-24 – 2020-08-25 (×5): 25 mg via ORAL
  Filled 2020-08-24 (×5): qty 1

## 2020-08-24 NOTE — H&P (Signed)
History and Physical    Shelly Bowers HKV:[id number] DOB: 1950/05/26 DOA: 08/23/2020  PCP: Isaac Bliss, Rayford Halsted, MD  Patient coming from: Home.  Chief Complaint: Elevated heart rate.  HPI: Shelly Bowers is a 70 y.o. female with history of hypertension and hyperlipidemia has been experiencing exertional shortness of breath for the last 1 week.  Patient also has some fluttering of the heart sensation for the same duration.  Over the last 1 week patient has been using 2-3 pillows to sleep.  Denies any chest pain.  Has not noticed any swelling of the legs.  [place] beach [place].  Patient had gone to urgent care over there patient was found to be in A. fib with RVR was transferred to the ER.  ED Course: In the ER patient was found to be in A. fib with RVR with chest x-ray showing cardiomegaly and congestion BNP 305 TSH was normal.  High sensitive troponins were negative.  Patient's heart rate improved with Cardizem bolus.  COVID test was negative.  Patient admitted for further observation given that patient has features concerning for new onset CHF.  Review of Systems: As per HPI, rest all negative.   Past Medical History:  Diagnosis Date  . HTN (hypertension)   . Hyperlipidemia   . Osteopenia     Past Surgical History:  Procedure Laterality Date  . AUGMENTATION MAMMAPLASTY Bilateral   . BREAST EXCISIONAL BIOPSY Left   . INCISION AND DRAINAGE ABSCESS Left 12/27/2015   Procedure: INCISION AND DRAINAGE ABSCESS;  Surgeon: Daryll Brod, MD;  Location: [place];  Service: Orthopedics;  Laterality: Left;     reports that she has never smoked. She has never used smokeless tobacco. She reports current alcohol use. She reports that she does not use drugs.  Allergies  Allergen Reactions  . Codeine Nausea Only  . Codeine Sulfate     REACTION: unspecified  . Hydrocodone Nausea And Vomiting    Family History  Problem Relation Age of Onset   . Breast cancer Mother 57    Prior to Admission medications   Medication Sig Start Date End Date Taking? Authorizing Provider  aspirin EC 81 MG tablet Take 1 tablet (81 mg total) by mouth daily. 02/28/18   Isaac Bliss, Rayford Halsted, MD  atorvastatin (LIPITOR) 10 MG tablet TAKE ONE TABLET BY MOUTH ONE TIME DAILY 08/23/20   Isaac Bliss, Rayford Halsted, MD  celecoxib (CELEBREX) 200 MG capsule TAKE 1 CAPSULE BY MOUTH 2 TIMES DAILY AS NEEDED Patient taking differently: Take 200 mg by mouth daily as needed. GYN 05/01/17   Marletta Lor, MD  hydrochlorothiazide (HYDRODIURIL) 25 MG tablet TAKE ONE TABLET BY MOUTH ONE TIME DAILY 03/22/20   Isaac Bliss, Rayford Halsted, MD  ibuprofen (ADVIL,MOTRIN) 200 MG tablet Take 200 mg by mouth every 6 (six) hours as needed.      [provider]  Vitamin D, Ergocalciferol, (DRISDOL) 1.25 MG (50000 UNIT) CAPS capsule Take 1 capsule (50,000 Units total) by mouth every 7 (seven) days for 12 doses. 08/04/20 10/21/20  Erline Hau, MD    Physical Exam: Constitutional: Moderately built and nourished. Vitals:   08/23/20 2230 08/23/20 2245 08/23/20 2300 08/23/20 2359  BP: 117/82 (!) 126/97 (!) 121/97 (!) 121/98  Pulse: 92 88 89   Resp: 20 20 (!) 21   Temp:      TempSrc:      SpO2: 95% 94% 91%   Weight:  Height:       Eyes: Anicteric no pallor. ENMT: No discharge from the ears eyes nose and mouth. Neck: No mass felt.  No neck rigidity. Respiratory: No rhonchi or crepitations. Cardiovascular: S1-S2 heard. Abdomen: Soft nontender bowel sounds present. Musculoskeletal: No edema. Skin: No rash. Neurologic: Alert awake oriented to time place and person.  Moves all extremities. Psychiatric: Appears normal.  Normal affect.   Labs on Admission: I have personally reviewed following labs and imaging studies  CBC: Recent Labs  Lab 08/23/20 1854  WBC 6.6  NEUTROABS 4.1  HGB 12.7  HCT 39.2  MCV 106.5*  PLT 161   Basic Metabolic  Panel: Recent Labs  Lab 08/23/20 1854 08/23/20 2020  NA 137  --   K 3.5  --   CL 100  --   CO2 27  --   GLUCOSE 111*  --   BUN 17  --   CREATININE 0.75  --   CALCIUM 9.5  --   MG  --  2.0   GFR: Estimated Creatinine Clearance: 69.3 mL/min (by C-G formula based on SCr of 0.75 mg/dL). Liver Function Tests: Recent Labs  Lab 08/23/20 1854  AST 37  ALT 42  ALKPHOS 84  BILITOT 1.2  PROT 6.9  ALBUMIN 4.5   No results for input(s): LIPASE, AMYLASE in the last 168 hours. No results for input(s): AMMONIA in the last 168 hours. Coagulation Profile: No results for input(s): INR, PROTIME in the last 168 hours. Cardiac Enzymes: No results for input(s): CKTOTAL, CKMB, CKMBINDEX, TROPONINI in the last 168 hours. BNP (last 3 results) No results for input(s): PROBNP in the last 8760 hours. HbA1C: No results for input(s): HGBA1C in the last 72 hours. CBG: No results for input(s): GLUCAP in the last 168 hours. Lipid Profile: No results for input(s): CHOL, HDL, LDLCALC, TRIG, CHOLHDL, LDLDIRECT in the last 72 hours. Thyroid Function Tests: Recent Labs    08/23/20 2142  TSH 2.304   Anemia Panel: No results for input(s): VITAMINB12, FOLATE, FERRITIN, TIBC, IRON, RETICCTPCT in the last 72 hours. Urine analysis:    Component Value Date/Time   BILIRUBINUR n 06/10/2015 0949   KETONESUR negative 04/29/2014 1012   PROTEINUR n 06/10/2015 0949   UROBILINOGEN 0.2 06/10/2015 0949   NITRITE n 06/10/2015 0949   LEUKOCYTESUR Negative 06/10/2015 0949   Sepsis Labs: @LABRCNTIP (procalcitonin:4,lacticidven:4) ) Recent Results (from the past 240 hour(s))  Resp Panel by RT-PCR (Flu A&B, Covid) Nasopharyngeal Swab     Status: None   Collection Time: 08/23/20  6:54 PM   Specimen: Nasopharyngeal Swab; Nasopharyngeal(NP) swabs in vial transport medium  Result Value Ref Range Status   SARS Coronavirus 2 by RT PCR NEGATIVE NEGATIVE Final    Comment: (NOTE) SARS-CoV-2 target nucleic acids are  NOT DETECTED.  The SARS-CoV-2 RNA is generally detectable in upper respiratory specimens during the acute phase of infection. The lowest concentration of SARS-CoV-2 viral copies this assay can detect is 138 copies/mL. A negative result does not preclude SARS-Cov-2 infection and should not be used as the sole basis for treatment or other patient management decisions. A negative result may occur with  improper specimen collection/handling, submission of specimen other than nasopharyngeal swab, presence of viral mutation(s) within the areas targeted by this assay, and inadequate number of viral copies(<138 copies/mL). A negative result must be combined with clinical observations, patient history, and epidemiological information. The expected result is Negative.  Fact Sheet for Patients:  EntrepreneurPulse.com.au  Fact Sheet for Healthcare Providers:  IncredibleEmployment.be  This test is no t yet approved or cleared by the [place] and  has been authorized for detection and/or diagnosis of SARS-CoV-2 by FDA under an Emergency Use Authorization (EUA). This EUA will remain  in effect (meaning this test can be used) for the duration of the COVID-19 declaration under Section 564(b)(1) of the Act, 21 U.S.C.section 360bbb-3(b)(1), unless the authorization is terminated  or revoked sooner.       Influenza A by PCR NEGATIVE NEGATIVE Final   Influenza B by PCR NEGATIVE NEGATIVE Final    Comment: (NOTE) The Xpert Xpress SARS-CoV-2/FLU/RSV plus assay is intended as an aid in the diagnosis of influenza from Nasopharyngeal swab specimens and should not be used as a sole basis for treatment. Nasal washings and aspirates are unacceptable for Xpert Xpress SARS-CoV-2/FLU/RSV testing.  Fact Sheet for Patients: EntrepreneurPulse.com.au  Fact Sheet for Healthcare Providers: IncredibleEmployment.be  This test is not  yet approved or cleared by the [place] FDA and has been authorized for detection and/or diagnosis of SARS-CoV-2 by FDA under an Emergency Use Authorization (EUA). This EUA will remain in effect (meaning this test can be used) for the duration of the COVID-19 declaration under Section 564(b)(1) of the Act, 21 U.S.C. section 360bbb-3(b)(1), unless the authorization is terminated or revoked.  Performed at [facility], [place] [address], [place], [place] [address]      Radiological Exams on Admission: DG Chest 2 View  Result Date: 08/23/2020 CLINICAL DATA:  70 year old female with tachycardia. EXAM: CHEST - 2 VIEW COMPARISON:  Chest radiograph dated 05/15/2016. FINDINGS: There is cardiomegaly with vascular congestion. No focal consolidation, pleural effusion, or pneumothorax. Bibasilar atelectasis. Degenerative changes of the spine. No acute osseous pathology. IMPRESSION: Cardiomegaly with vascular congestion. No focal consolidation. Electronically Signed   By: Anner Crete M.D.   On: 08/23/2020 20:42    EKG: Independently reviewed.  A. fib with RVR.  Assessment/Plan Principal Problem:   Atrial fibrillation with RVR (HCC) Active Problems:   Essential hypertension   Hyperlipidemia    1. A. fib with RVR new onset heart rate is improved with Cardizem bolus.  We will keep patient on metoprolol 25 mg p.o. twice daily.  Check 2D echo.  TSH was normal.  Will consult cardiology.  CHADS2 vasc score is at least 2.  Patient is on apixaban.  Check D-dimer. 2. Possible new onset CHF.  Check 2D echo.  We will have further plans based on it. 3. Hypertension usually takes hydrochlorothiazide presently on metoprolol. 4. Hyperlipidemia on statins.   DVT prophylaxis: Apixaban. Code Status: Full code. Family Communication: Discussed with patient. Disposition Plan: Home. Consults called: Cardiology. Admission status: Observation.   Rise Patience MD [facility] Pager [phone].  If 7PM-7AM, please contact night-coverage [email or web address] Password [facility]  08/24/2020, 12:06 AM

## 2020-08-24 NOTE — Progress Notes (Signed)
    [facility] has been requested to perform a transesophageal echocardiogram on Saphronia Ozdemir for atrial fibrillation.  After careful review of history and examination, the risks and benefits of transesophageal echocardiogram have been explained including risks of esophageal damage, perforation (1:10,000 risk), bleeding, pharyngeal hematoma as well as other potential complications associated with conscious sedation including aspiration, arrhythmia, respiratory failure and death. Alternatives to treatment were discussed, questions were answered. Patient is willing to proceed.   Tami Lin Peyton Rossner, [place]  08/24/2020 11:43 AM

## 2020-08-24 NOTE — ED Notes (Signed)
Pt placed on 2L [place] due to O2 saturation of 88% on RA

## 2020-08-24 NOTE — Discharge Instructions (Addendum)

## 2020-08-24 NOTE — Progress Notes (Signed)
Patient is scheduled for TEE-guided DCCV tomorrow at 1300 at [place]. NPO at [facility] please.

## 2020-08-24 NOTE — ED Notes (Signed)
Pt able to ambulate to restroom w/o assistance.  

## 2020-08-24 NOTE — Progress Notes (Addendum)
Cardiology Consultation:   Patient ID: Shelly Bowers MRN: [id number]; DOB: Aug 08, 1950  Admit date: 08/23/2020 Date of Consult: 08/24/2020  PCP:  Isaac Bliss, Rayford Halsted, MD   [facility] Providers Cardiologist:  Minus Breeding, MD    Patient Profile:   Shelly Bowers is a 70 y.o. female with a hx of palpitations, mild dyslipidemia, HTN, and OA who is being seen 08/24/2020 for the evaluation of Afib RVR at the request of Dr. Starla Link.  History of Present Illness:   Shelly Bowers was seen in 2017 for palpitations. EKG showed NSR in the 60s. A 48 hr holter monitor was planned but not completed. Echocardiogram at that time showed EF 60-65%, grade 1 DD, trivial MR, mildly dilated left atrium, and mild TR. She is maintained on 10 mg lipitor, 81 mg ASA, and 25 mg HCTZ.   She presented to Urgent Care 08/23/20 with palpitations, shortness of breath, and orthopnea x 1 weeks and found to be in rapid Afib. She was sent to [facility] for further management. On arrival, HR in the 140s. She was given one injection of 20 mg IV cardizem, 60 mg PO cardizem x 1, and started on lopressor 25 mg BID.   She reports a family history of Afib in her father. She states she remembers having palpitations in 2017, but does not remember needing a heart monitor. She states her heart fluttering from 2017 was improved when she was started on folate and B12 by her PCP. She states she now occasionally gets heart fluttering, usually at night. She drinks wine nightly. Over the last 2-3 months, she has been unable to sleep at night - waking after only 1 hr of sleep to use the restroom, which is new for her. She describes worsening heart fluttering over the last month. She denies syncope. She reports feeling "off" last Wed, having fatigue, shortness of breath, and a dry cough. She continued to walk her dog outdoors, but felt as though she had worsening dyspnea on exertion. She tried to take a nap yesterday, but couldn't get  comfortable, felt like she had a bad cold. She researched COVID symptoms and went to [facility] for a COVID test. EKG there showed Afib RVR. EMS was dispatched and administered 10 mg IV cardizem push.     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 She lives at home with her husband. She does not smoke cigarettes, does drink wine nightly.   Past Medical History:  Diagnosis Date  . HTN (hypertension)   . Hyperlipidemia   . Osteopenia     Past Surgical History:  Procedure Laterality Date  . AUGMENTATION MAMMAPLASTY Bilateral   . BREAST EXCISIONAL BIOPSY Left   . INCISION AND DRAINAGE ABSCESS Left 12/27/2015   Procedure: INCISION AND DRAINAGE ABSCESS;  Surgeon: Daryll Brod, MD;  Location: [place];  Service: Orthopedics;  Laterality: Left;     Home Medications:  Prior to Admission medications   Medication Sig Start Date End Date Taking? Authorizing Provider  aspirin EC 81 MG tablet Take 1 tablet (81 mg total) by mouth daily. 02/28/18  Yes Isaac Bliss, Rayford Halsted, MD  atorvastatin (LIPITOR) 10 MG tablet TAKE ONE TABLET BY MOUTH ONE TIME DAILY Patient taking differently: Take 10 mg by mouth daily. 08/23/20  Yes Isaac Bliss, Rayford Halsted, MD  celecoxib (CELEBREX) 200 MG capsule TAKE 1 CAPSULE BY MOUTH 2 TIMES DAILY AS NEEDED Patient taking differently: Take 200 mg by mouth daily  as needed for mild pain. 05/01/17  Yes Marletta Lor, MD  Cyanocobalamin (B-12 PO) Take 1 tablet by mouth daily.   Yes [provider]  FOLIC ACID PO Take 1 tablet by mouth daily.   Yes [provider]  hydrochlorothiazide (HYDRODIURIL) 25 MG tablet TAKE ONE TABLET BY MOUTH ONE TIME DAILY Patient taking differently: Take 25 mg by mouth daily. 03/22/20  Yes Isaac Bliss, Rayford Halsted, MD  ibuprofen (ADVIL,MOTRIN) 200 MG tablet Take 400 mg by mouth every 6 (six) hours as needed for headache, fever or mild pain.   Yes  [provider]  triamcinolone (NASACORT) 55 MCG/ACT AERO nasal inhaler Place 2 sprays into the nose 2 (two) times daily as needed (allergies).   Yes [provider]  Vitamin D, Ergocalciferol, (DRISDOL) 1.25 MG (50000 UNIT) CAPS capsule Take 1 capsule (50,000 Units total) by mouth every 7 (seven) days for 12 doses. 08/04/20 10/21/20 Yes Erline Hau, MD  vitamin E 1000 UNIT capsule Take 1,000 Units by mouth daily.   Yes [provider]    Inpatient Medications: Scheduled Meds: . apixaban  5 mg Oral BID  . atorvastatin  10 mg Oral Daily  . furosemide  40 mg Intravenous Once  . metoprolol tartrate  25 mg Oral BID   Continuous Infusions:  PRN Meds:   Allergies:    Allergies  Allergen Reactions  . Codeine Nausea Only  . Codeine Sulfate     REACTION: unspecified  . Hydrocodone Nausea And Vomiting    Social History:   Social History   Socioeconomic History  . Marital status: Married    Spouse name: Not on file  . Number of children: Not on file  . Years of education: Not on file  . Highest education level: Not on file  Occupational History  . Not on file  Tobacco Use  . Smoking status: Never Smoker  . Smokeless tobacco: Never Used  Substance and Sexual Activity  . Alcohol use: Yes    Comment: occ  . Drug use: No  . Sexual activity: Not on file  Other Topics Concern  . Not on file  Social History Narrative  . Not on file   Social Determinants of Health   Financial Resource Strain: Not on file  Food Insecurity: Not on file  Transportation Needs: Not on file  Physical Activity: Not on file  Stress: Not on file  Social Connections: Not on file  Intimate Partner Violence: Not on file    Family History:    Family History  Problem Relation Age of Onset  . Breast cancer Mother 67     ROS:  Please see the history of present illness.   All other ROS reviewed and negative.     Physical Exam/Data:   Vitals:   08/24/20  0640 08/24/20 0650 08/24/20 0700 08/24/20 0741  BP: (!) 114/103  (!) 125/100 (!) 126/42  Pulse: (!) 124 (!) 108 (!) 109 (!) 110  Resp: 17 (!) 23 18 20   Temp:    98 F (36.7 C)  TempSrc:    Oral  SpO2: 95% 96% 94% 94%  Weight:      Height:        Intake/Output Summary (Last 24 hours) at 08/24/2020 1000 Last data filed at 08/23/2020 2357 Gross per 24 hour  Intake 500 ml  Output --  Net 500 ml   Last 3 Weights 08/23/2020 01/22/2020 09/22/2019  Weight (lbs) 185 lb 185 lb 11.2 oz  181 lb 9.6 oz  Weight (kg) 83.915 kg 84.233 kg 82.373 kg     Body mass index is 29.86 kg/m.  General:  Well nourished, well developed, in no acute distress HEENT: normal Lymph: no adenopathy Neck: no JVD Endocrine:  No thryomegaly Vascular: No carotid bruits; FA pulses 2+ bilaterally without bruits  Cardiac:  Irregular rhythm, tachycardic rate, no murmur Lungs:  clear to auscultation bilaterally, no wheezing, rhonchi or rales  Abd: soft, nontender, no hepatomegaly  Ext: no edema Musculoskeletal:  No deformities, BUE and BLE strength normal and equal Skin: warm and dry  Neuro:  CNs 2-12 intact, no focal abnormalities noted Psych:  Normal affect   EKG:  The EKG was personally reviewed and demonstrates:  Atrial fibrillation with ventricular rate 134 Telemetry:  Telemetry was personally reviewed and demonstrates:  Atrial fibrillation with ventricular rates in the 80-140s  Relevant CV Studies:  Echo pending  Echo 2017: Study Conclusions  - Left ventricle: The cavity size was normal. Wall thickness was  increased in a pattern of moderate LVH. Systolic function was  normal. The estimated ejection fraction was in the range of 60%  to 65%. Wall motion was normal; there were no regional wall  motion abnormalities. Doppler parameters are consistent with  abnormal left ventricular relaxation (grade 1 diastolic  dysfunction). The E/e&' ratio is between 8-15, suggesting  indeterminate LV filling  pressure.  - Mitral valve: Calcified annulus. There was trivial regurgitation.  - Left atrium: The atrium was mildly dilated.  - Tricuspid valve: There was mild regurgitation.  - Pulmonary arteries: PA peak pressure: 35 mm Hg (S).  - Inferior vena cava: The vessel was normal in size. The  respirophasic diameter changes were in the normal range (= 50%),  consistent with normal central venous pressure.   Impressions:  - LVEF 60-65%, moderate LVH, normal wall motion, diastolic  dysfunction, indeterminate LV filling pressure, trivial MR, mild  LAE, mild TR, RVSP 35 mmHg, normal IVC.   Laboratory Data:  High Sensitivity Troponin:   Recent Labs  Lab 08/23/20 2020 08/23/20 2220  TROPONINIHS 10 11     Chemistry Recent Labs  Lab 08/23/20 1854 08/24/20 0428  NA 137 136  K 3.5 3.3*  CL 100 99  CO2 27 25  GLUCOSE 111* 110*  BUN 17 16  CREATININE 0.75 0.82  CALCIUM 9.5 9.1  GFRNONAA >60 >60  ANIONGAP 10 12    Recent Labs  Lab 08/23/20 1854  PROT 6.9  ALBUMIN 4.5  AST 37  ALT 42  ALKPHOS 84  BILITOT 1.2   Hematology Recent Labs  Lab 08/23/20 1854 08/24/20 0428  WBC 6.6 6.5  RBC 3.68* 3.43*  HGB 12.7 11.9*  HCT 39.2 36.2  MCV 106.5* 105.5*  MCH 34.5* 34.7*  MCHC 32.4 32.9  RDW 13.6 13.7  PLT 197 180   BNP Recent Labs  Lab 08/23/20 2020  BNP 370.5*    DDimer  Recent Labs  Lab 08/24/20 0428  DDIMER 1.73*     Radiology/Studies:  DG Chest 2 View  Result Date: 08/23/2020 CLINICAL DATA:  70 year old female with tachycardia. EXAM: CHEST - 2 VIEW COMPARISON:  Chest radiograph dated 05/15/2016. FINDINGS: There is cardiomegaly with vascular congestion. No focal consolidation, pleural effusion, or pneumothorax. Bibasilar atelectasis. Degenerative changes of the spine. No acute osseous pathology. IMPRESSION: Cardiomegaly with vascular congestion. No focal consolidation. Electronically Signed   By: Anner Crete M.D.   On: 08/23/2020 20:42      Assessment and  Plan:   Afib with RVR Hx of palpitations - new diagnosis - ventricular rates currently in the 110-120s - TSH and Mg WNL, K 3.3 - plan to supplement - was given 20 mg IV cardizem and started on short acting 60 mg x 1 dose + lopressor 25 mg BID - appears as though IV cardizem reduced her HR to 80s, but she has since drifted back up the 110s-120s - will increase metoprolol to 25 mg TID for better rate control - would like to avoid amiodarone given age - discussed TEE-guided cardioversion with her if she does not convert today   Need for chronic anticoagulation This patients CHA2DS2-VASc Score and unadjusted Ischemic Stroke Rate (% per year) is equal to at least  3.2 % stroke rate/year from a score of 16 (age, female, HTN +/-CHF) - has been started on 5 mg eliquis BID - first dose last night   Shortness of breath - BNP 370.5 - D-dimer 1.73 - reports 4 pillow orthopnea yesterday prior to presentation - echo pending - do not appreciate significant volume overload on exam, CXR does show vascular congestion - scheduled for 40 mg IV lasix    Hypertension - on HCTZ at home - on hold for rate controlling agents - pressure in the 110-130s   Hyperlipidemia 01/22/2020: Cholesterol 219; HDL 71; LDL Cholesterol (Calc) 125; Triglycerides 115 On 10 mg lipitor   I will attempt to get her on for TEE-guided cardioversion tomorrow, pending availability in endo.    Risk Assessment/Risk Scores:   New York Heart Association (NYHA) Functional Class NYHA Class III  CHA2DS2-VASc Score = 3  This indicates a 3.2% annual risk of stroke. The patient's score is based upon: CHF History: No HTN History: Yes Diabetes History: No Stroke History: No Vascular Disease History: No Age Score: 1 Gender Score: 1      For questions or updates, please contact [place] Please consult [email or web address] for contact info under    Signed, Ledora Bottcher, PA  08/24/2020 10:00  AM   History and all data above reviewed.  Patient examined.  I agree with the findings as above.   The patient has no past cardiac history.  She really has been feeling well for about a week.  That is when she noticed that she had more shortness of breath walking up a slight incline.  She would have had any reason to check her blood pressure or her heart rate.  More than a week ago she saw her GYN and she was not notice to have tachycardia.  She never had this sensation before.  She is not really feeling palpitations.  She has not describing new PND or orthopnea.  Has had no chest pressure, neck or arm discomfort.  Has had no weight gain or edema.  She is felt fatigued.  The patient exam reveals COR: Irregular, no murmurs,  Lungs: Bilateral crackles basilar right greater than left,  Abd: Positive bowel sounds normal in frequency and pitch, no bruits, rebound, guarding, Ext 2+ pulses throughout, no edema..  All available labs, radiology testing, previous records reviewed. Agree with documented assessment and plan.  Atrial fibrillation: I suspect this is been going on for at least a week.  We have given her beta-blocker and she had p.o. Cardizem.  I am going to start IV Cardizem as her rate is still in the 130s.  If she does not convert overnight we do have her prepared for TEE guided cardioversion and we discussed this  with her.  She is now started on anticoagulation and she has no contraindications.  SOB: She does have some dyspnea an echocardiogram is pending.  She is getting a dose of IV Lasix.  She does have some vascular congestion on chest x-ray and slightly elevated BNP.   Jeneen Rinks [facility]  10:35 AM  08/24/2020

## 2020-08-24 NOTE — Progress Notes (Signed)
Patient ID: Shelly Bowers, female   DOB: [birth year], 70 y.o.   MRN: [id number] Patient admitted early this morning for A. fib with RVR which improved after a dose of IV Cardizem.  Cardiology consultation is pending.  Patient seen and examined at bedside and plan of care discussed with her.  Currently still intermittently tachycardic going up to the 130s.  I have reviewed patient's medical records including this morning's H&P, current vitals, medications and labs myself.  2D echo is pending.  We will give a dose of IV Lasix and monitor input and output.  Repeat a.m. labs.

## 2020-08-24 NOTE — ED Notes (Signed)
Ambulatory to bathroom.

## 2020-08-24 NOTE — Progress Notes (Signed)
  Echocardiogram 2D Echocardiogram has been performed.  Bobbye Charleston 08/24/2020, 12:10 PM

## 2020-08-25 ENCOUNTER — Inpatient Hospital Stay (HOSPITAL_COMMUNITY): Payer: Medicare HMO

## 2020-08-25 ENCOUNTER — Encounter (HOSPITAL_COMMUNITY)
Admission: EM | Disposition: A | Discharge status: Home / Self Care | Payer: Self-pay | Source: Home / Self Care | Attending: Internal Medicine

## 2020-08-25 ENCOUNTER — Inpatient Hospital Stay (HOSPITAL_COMMUNITY): Payer: Medicare HMO | Admitting: Certified Registered"

## 2020-08-25 ENCOUNTER — Encounter (HOSPITAL_COMMUNITY): Payer: Self-pay | Admitting: Internal Medicine

## 2020-08-25 DIAGNOSIS — I5031 Acute diastolic (congestive) heart failure: Secondary | ICD-10-CM

## 2020-08-25 DIAGNOSIS — I10 Essential (primary) hypertension: Secondary | ICD-10-CM

## 2020-08-25 DIAGNOSIS — Q211 Atrial septal defect: Secondary | ICD-10-CM

## 2020-08-25 DIAGNOSIS — I340 Nonrheumatic mitral (valve) insufficiency: Secondary | ICD-10-CM

## 2020-08-25 DIAGNOSIS — I4891 Unspecified atrial fibrillation: Secondary | ICD-10-CM

## 2020-08-25 HISTORY — PX: CARDIOVERSION: SHX1299

## 2020-08-25 HISTORY — PX: TEE WITHOUT CARDIOVERSION: SHX5443

## 2020-08-25 HISTORY — PX: BUBBLE STUDY: SHX6837

## 2020-08-25 SURGERY — ECHOCARDIOGRAM, TRANSESOPHAGEAL
Anesthesia: Monitor Anesthesia Care

## 2020-08-25 MED ORDER — ACETAMINOPHEN 500 MG PO TABS
1000.0000 mg | ORAL_TABLET | Freq: Four times a day (QID) | ORAL | Status: DC | PRN
  Administered 2020-08-25: 1000 mg via ORAL
  Filled 2020-08-25: qty 2

## 2020-08-25 MED ORDER — PANTOPRAZOLE SODIUM 40 MG PO TBEC
40.0000 mg | DELAYED_RELEASE_TABLET | Freq: Every day | ORAL | Status: DC
  Administered 2020-08-25 – 2020-08-26 (×2): 40 mg via ORAL
  Filled 2020-08-25 (×2): qty 1

## 2020-08-25 MED ORDER — ALPRAZOLAM 0.25 MG PO TABS
0.2500 mg | ORAL_TABLET | Freq: Three times a day (TID) | ORAL | Status: DC | PRN
  Administered 2020-08-25: 0.25 mg via ORAL
  Filled 2020-08-25: qty 1

## 2020-08-25 MED ORDER — SODIUM CHLORIDE 0.9 % IV SOLN: INTRAVENOUS | Status: DC

## 2020-08-25 MED ORDER — HYDROCORTISONE 1 % EX CREA
1.0000 "application " | TOPICAL_CREAM | Freq: Three times a day (TID) | CUTANEOUS | Status: DC | PRN
  Filled 2020-08-25: qty 28

## 2020-08-25 MED ORDER — PROPOFOL 500 MG/50ML IV EMUL
INTRAVENOUS | Status: DC | PRN
  Administered 2020-08-25: 200 ug/kg/min via INTRAVENOUS

## 2020-08-25 MED ORDER — MELATONIN 3 MG PO TABS
3.0000 mg | ORAL_TABLET | Freq: Every day | ORAL | Status: DC
  Administered 2020-08-25: 3 mg via ORAL
  Filled 2020-08-25: qty 1

## 2020-08-25 MED ORDER — LIDOCAINE 2% (20 MG/ML) 5 ML SYRINGE
INTRAMUSCULAR | Status: DC | PRN
  Administered 2020-08-25: 40 mg via INTRAVENOUS

## 2020-08-25 MED ORDER — SACUBITRIL-VALSARTAN 24-26 MG PO TABS
1.0000 | ORAL_TABLET | Freq: Two times a day (BID) | ORAL | Status: DC
  Administered 2020-08-25 – 2020-08-26 (×2): 1 via ORAL
  Filled 2020-08-25 (×3): qty 1

## 2020-08-25 NOTE — H&P (View-Only) (Signed)
Progress Note  Patient Name: Shelly Bowers Date of Encounter: 08/25/2020  [facility] Cardiologist: Minus Breeding, MD   Subjective   Notified by primary team and RN that patient wishes to leave AMA. I spoke with her and she is very upset that she has been here since Tues in rooms without windows. She states she is not anxious about the procedure, but "can't stay here any longer."  She described nausea and feeling like she is smothering when she tries to sleep. I discussed that these symptoms are likely due to her Afib RVR and CHF.   Upon further discussion, I have convinced her to stay for cardioversion. However, she has had some saltine crackers at 3AM and sips of water until about 8AM. Awaiting confirmation for procedure from Dr. Gasper Sells.  She has a nonproductive cough, but able to lay flat.  Inpatient Medications    Scheduled Meds: . apixaban  5 mg Oral BID  . atorvastatin  10 mg Oral Daily  . metoprolol tartrate  25 mg Oral TID  . pantoprazole  40 mg Oral Daily   Continuous Infusions:  PRN Meds: acetaminophen, guaiFENesin-dextromethorphan   Vital Signs    Vitals:   08/24/20 2139 08/25/20 0000 08/25/20 0205 08/25/20 0443  BP: (!) 126/92  (!) 123/102 (!) 141/112  Pulse: 85  76 88  Resp: 20 18 20 17   Temp: 98 F (36.7 C)  97.8 F (36.6 C) 98.3 F (36.8 C)  TempSrc: Oral  Oral Oral  SpO2: 95%  95% 91%  Weight:      Height:        Intake/Output Summary (Last 24 hours) at 08/25/2020 0936 Last data filed at 08/24/2020 2353 Gross per 24 hour  Intake 117.36 ml  Output 75 ml  Net 42.36 ml   Last 3 Weights 08/23/2020 01/22/2020 09/22/2019  Weight (lbs) 185 lb 185 lb 11.2 oz 181 lb 9.6 oz  Weight (kg) 83.915 kg 84.233 kg 82.373 kg      Telemetry    afib heart rates in the 120s - Personally Reviewed  ECG    No new tracings - Personally Reviewed  Physical Exam   GEN: No acute distress.   Neck: No JVD Cardiac: irregular rhythm, tachycardic  rate Respiratory: Clear to auscultation bilaterally, nonproductive cough GI: Soft, nontender, non-distended  MS: minimal edema; No deformity. Neuro:  Nonfocal  Psych: Normal affect   Labs    High Sensitivity Troponin:   Recent Labs  Lab 08/23/20 2020 08/23/20 2220  TROPONINIHS 10 11      Chemistry Recent Labs  Lab 08/23/20 1854 08/24/20 0428  NA 137 136  K 3.5 3.3*  CL 100 99  CO2 27 25  GLUCOSE 111* 110*  BUN 17 16  CREATININE 0.75 0.82  CALCIUM 9.5 9.1  PROT 6.9  --   ALBUMIN 4.5  --   AST 37  --   ALT 42  --   ALKPHOS 84  --   BILITOT 1.2  --   GFRNONAA >60 >60  ANIONGAP 10 12     Hematology Recent Labs  Lab 08/23/20 1854 08/24/20 0428  WBC 6.6 6.5  RBC 3.68* 3.43*  HGB 12.7 11.9*  HCT 39.2 36.2  MCV 106.5* 105.5*  MCH 34.5* 34.7*  MCHC 32.4 32.9  RDW 13.6 13.7  PLT 197 180    BNP Recent Labs  Lab 08/23/20 2020  BNP 370.5*     DDimer  Recent Labs  Lab 08/24/20 0428  DDIMER  1.73*     Radiology    DG Chest 2 View  Result Date: 08/23/2020 CLINICAL DATA:  70 year old female with tachycardia. EXAM: CHEST - 2 VIEW COMPARISON:  Chest radiograph dated 05/15/2016. FINDINGS: There is cardiomegaly with vascular congestion. No focal consolidation, pleural effusion, or pneumothorax. Bibasilar atelectasis. Degenerative changes of the spine. No acute osseous pathology. IMPRESSION: Cardiomegaly with vascular congestion. No focal consolidation. Electronically Signed   By: Anner Crete M.D.   On: 08/23/2020 20:42   ECHOCARDIOGRAM COMPLETE  Result Date: 08/24/2020    ECHOCARDIOGRAM REPORT   Patient Name:   Shelly Bowers Date of Exam: 08/24/2020 Medical Rec #:  [id number]         Height:       66.0 in Accession #:    [id number]        Weight:       185.0 lb Date of Birth:  March 11, 1951         BSA:          1.935 m Patient Age:    34 years          BP:           124/100 mmHg Patient Gender: F                 HR:           130 bpm. Exam Location:   Inpatient Procedure: 2D Echo, Cardiac Doppler, Color Doppler and Intracardiac            Opacification Agent Indications:    I48.91* Unspeicified atrial fibrillation; I50.40* Unspecified                 combined systolic (congestive) and diastolic (congestive) heart                 failure  History:        Patient has prior history of Echocardiogram examinations, most                 recent 08/07/2019. Abnormal ECG, Arrythmias:Atrial Fibrillation,                 Signs/Symptoms:Chest Pain; Risk Factors:Hypertension and                 Dyslipidemia.  Sonographer:    Roseanna Rainbow RDCS Referring Phys: [place]  Sonographer Comments: Technically challenging study due to limited acoustic windows, Technically difficult study due to poor echo windows, suboptimal parasternal window and suboptimal apical window. Suboptimal machine. IMPRESSIONS  1. Left ventricular ejection fraction, by estimation, is 30 to 35%. The left ventricle has moderately decreased function. The left ventricle demonstrates global hypokinesis. There is mild left ventricular hypertrophy. Left ventricular diastolic parameters are indeterminate.  2. Right ventricular systolic function is mildly reduced. The right ventricular size is mildly enlarged. Tricuspid regurgitation signal is inadequate for assessing PA pressure.  3. Left atrial size was mild to moderately dilated.  4. Right atrial size was mildly dilated.  5. The mitral valve is degenerative. Mild mitral valve regurgitation. No evidence of mitral stenosis.  6. The aortic valve is grossly normal. Aortic valve regurgitation is not visualized. No aortic stenosis is present.  7. The inferior vena cava is dilated in size with <50% respiratory variability, suggesting right atrial pressure of 15 mmHg. FINDINGS  Left Ventricle: Left ventricular ejection fraction, by estimation, is 30 to 35%. The left ventricle has moderately decreased function. The left ventricle demonstrates global  hypokinesis. Definity contrast  agent was given IV to delineate the left ventricular endocardial borders. The left ventricular internal cavity size was normal in size. There is mild left ventricular hypertrophy. Left ventricular diastolic parameters are indeterminate. Right Ventricle: The right ventricular size is mildly enlarged. No increase in right ventricular wall thickness. Right ventricular systolic function is mildly reduced. Tricuspid regurgitation signal is inadequate for assessing PA pressure. Left Atrium: Left atrial size was mild to moderately dilated. Right Atrium: Right atrial size was mildly dilated. Pericardium: Trivial pericardial effusion is present. Presence of pericardial fat pad. Mitral Valve: The mitral valve is degenerative in appearance. Mild to moderate mitral annular calcification. Mild mitral valve regurgitation. No evidence of mitral valve stenosis. Tricuspid Valve: The tricuspid valve is normal in structure. Tricuspid valve regurgitation is trivial. No evidence of tricuspid stenosis. Aortic Valve: The aortic valve is grossly normal. Aortic valve regurgitation is not visualized. No aortic stenosis is present. Pulmonic Valve: The pulmonic valve was not well visualized. Pulmonic valve regurgitation is trivial. No evidence of pulmonic stenosis. Aorta: The aortic root is normal in size and structure. Venous: The inferior vena cava is dilated in size with less than 50% respiratory variability, suggesting right atrial pressure of 15 mmHg. IAS/Shunts: No atrial level shunt detected by color flow Doppler.  LEFT VENTRICLE PLAX 2D LVIDd:         4.35 cm LVIDs:         3.35 cm LV PW:         1.03 cm LV IVS:        1.01 cm LVOT diam:     1.80 cm LV SV:         32 LV SV Index:   16 LVOT Area:     2.54 cm  LV Volumes (MOD) LV vol d, MOD A2C: 71.7 ml LV vol d, MOD A4C: 74.6 ml LV vol s, MOD A2C: 47.4 ml LV vol s, MOD A4C: 47.7 ml LV SV MOD A2C:     24.3 ml LV SV MOD A4C:     74.6 ml LV SV MOD BP:       26.4 ml RIGHT VENTRICLE            IVC RV S prime:     8.49 cm/s  IVC diam: 3.01 cm TAPSE (M-mode): 1.6 cm LEFT ATRIUM             Index       RIGHT ATRIUM           Index LA diam:        3.90 cm 2.02 cm/m  RA Area:     16.10 cm LA Vol (A2C):   60.3 ml 31.17 ml/m RA Volume:   39.50 ml  20.42 ml/m LA Vol (A4C):   97.9 ml 50.60 ml/m LA Biplane Vol: 78.3 ml 40.47 ml/m  AORTIC VALVE             PULMONIC VALVE LVOT Vmax:   81.30 cm/s  PR End Diast Vel: 1.62 msec LVOT Vmean:  61.200 cm/s LVOT VTI:    0.124 m  AORTA Ao Root diam: 3.00 cm Ao Asc diam:  3.40 cm MITRAL VALVE MV Area (PHT): 6.29 cm    SHUNTS MV Decel Time: 121 msec    Systemic VTI:  0.12 m MV E velocity: 91.43 cm/s  Systemic Diam: 1.80 cm Cherlynn Kaiser MD Electronically signed by Cherlynn Kaiser MD Signature Date/Time: 08/24/2020/2:39:50 PM    Final     Cardiac Studies  Echo 08/24/20: 1. Left ventricular ejection fraction, by estimation, is 30 to 35%. The  left ventricle has moderately decreased function. The left ventricle  demonstrates global hypokinesis. There is mild left ventricular  hypertrophy. Left ventricular diastolic  parameters are indeterminate.  2. Right ventricular systolic function is mildly reduced. The right  ventricular size is mildly enlarged. Tricuspid regurgitation signal is  inadequate for assessing PA pressure.  3. Left atrial size was mild to moderately dilated.  4. Right atrial size was mildly dilated.  5. The mitral valve is degenerative. Mild mitral valve regurgitation. No  evidence of mitral stenosis.  6. The aortic valve is grossly normal. Aortic valve regurgitation is not  visualized. No aortic stenosis is present.  7. The inferior vena cava is dilated in size with <50% respiratory  variability, suggesting right atrial pressure of 15 mmHg.   Patient Profile     70 y.o. female with a hx of palpitations, mild dyslipidemia, HTN, and OA who is being seen 08/24/2020 for the evaluation of Afib RVR.  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   Assessment & Plan    Atrial fibrillation with RVR - D/C'ed cardizem gtt due to reduced EF - metoprolol 25 mg TID --> will increase this to 50 mg TID if rates increase significantly with discontinuation of cardizem gtt - plan for TEE-DCCV today    Chronic anticoagulation - continue eliquis This patients CHA2DS2-VASc Score and unadjusted Ischemic Stroke Rate (% per year) is equal to 4.8 % stroke rate/year from a score of 4 (CHF, female, age, HTN   Cough - COVID negative this admission - she complains of fatigue, nausea, and dry cough --> symptoms may be related to underlying viral URI and exacerbated by RVR and CHF - continue supportive care   Acute systolic heart failure Mildly reduced RV function - BNP elevated and congestion on CXR - echo yesterday with EF 30-35%, mildly reduced RV function - will reorder 40 mg IV lasix daily - no weight or I&Os - increase BB for rate control - if BP tolerates, would initiate ARNI - plan to repeat echo after GDMT maximized and restoration of NSR - she denies chest pain - suspect a component of tachycardia mediated CM, can consider ischemic evaluation OP    Hyperlipidemia  01/22/2020: Cholesterol 219; HDL 71; LDL Cholesterol (Calc) 125; Triglycerides 115 - recommend LDL less than at least 100 - continue 10 mg lipitor     For questions or updates, please contact [facility] Please consult [email or web address] for contact info under        Signed, Ledora Bottcher, PA  08/25/2020, 9:36 AM    History and all data above reviewed.  Patient examined.  I agree with the findings as above.   The patient is very agitated about being in the hospital for two days.  She wanted to leave as above but does consent to TEE/DCCV.   The patient exam reveals IRW:[id number]   ,  Lungs: Clear  ,  Abd: Positive bowel sounds, no rebound no guarding, Ext No edema  .  All available labs, radiology testing, previous  records reviewed. Agree with documented assessment and plan.   Atrial fib:  Rate is still elevated despite beta blocker.  Was controlled on IV dilt.  On Eliquis.  TEE/DCCV today.  Acute systolic HF:  Possibly tachy mediated.  DCCV today.  Start Enteresto.  We can consolidate the beta blocker before discharge  .   Jeneen Rinks Chelsie Burel  11:27  AM  08/25/2020

## 2020-08-25 NOTE — Progress Notes (Signed)
Alert and oriented x 4. Denies pain or discomfort. Continued on Cardizem drip and within therapeutic HR. IV infiltrated and replaced. Pt is NPO but ate saltine crackers saying her stomach was bothering her. She is anxious about procedure and says she might not go through with it. Encouraged to make concerns known to her Doctors.

## 2020-08-25 NOTE — Interval H&P Note (Signed)
History and Physical Interval Note:  08/25/2020 12:55 PM  Shelly Bowers  has presented today for surgery, with the diagnosis of A-FIB.  The various methods of treatment have been discussed with the patient and family. After consideration of risks, benefits and other options for treatment, the patient has consented to  Procedure(s): TRANSESOPHAGEAL ECHOCARDIOGRAM (TEE) (N/A) CARDIOVERSION (N/A) as a surgical intervention.  The patient's history has been reviewed, patient examined, no change in status, stable for surgery.  I have reviewed the patient's chart and labs.  Questions were answered to the patient's satisfaction.     Rishi Vicario A [name]

## 2020-08-25 NOTE — Anesthesia Preprocedure Evaluation (Signed)
Anesthesia Evaluation  Patient identified by MRN, date of birth, ID band Patient awake    Reviewed: Allergy & Precautions, NPO status , Patient's Chart, lab work & pertinent test results  Airway Mallampati: II  TM Distance: >3 FB Neck ROM: Full    Dental  (+) Dental Advisory Given   Pulmonary neg pulmonary ROS,    breath sounds clear to auscultation       Cardiovascular hypertension, Pt. on medications  Rhythm:Regular Rate:Normal     Neuro/Psych  Neuromuscular disease    GI/Hepatic negative GI ROS, Neg liver ROS,   Endo/Other  negative endocrine ROS  Renal/GU negative Renal ROS     Musculoskeletal   Abdominal   Peds  Hematology negative hematology ROS (+)   Anesthesia Other Findings   Reproductive/Obstetrics                             Anesthesia Physical Anesthesia Plan  ASA: II  Anesthesia Plan: MAC   Post-op Pain Management:    Induction:   PONV Risk Score and Plan: 2 and Propofol infusion and Treatment may vary due to age or medical condition  Airway Management Planned: Natural Airway and Nasal Cannula  Additional Equipment:   Intra-op Plan:   Post-operative Plan:   Informed Consent: I have reviewed the patients History and Physical, chart, labs and discussed the procedure including the risks, benefits and alternatives for the proposed anesthesia with the patient or authorized representative who has indicated his/her understanding and acceptance.       Plan Discussed with:   Anesthesia Plan Comments:         Anesthesia Quick Evaluation

## 2020-08-25 NOTE — Transfer of Care (Signed)
Immediate Anesthesia Transfer of Care Note  Patient: Shelly Bowers  Procedure(s) Performed: TRANSESOPHAGEAL ECHOCARDIOGRAM (TEE) (N/A ) CARDIOVERSION (N/A ) BUBBLE STUDY  Patient Location: Endoscopy Unit  Anesthesia Type:MAC  Level of Consciousness: drowsy and patient cooperative  Airway & Oxygen Therapy: Patient Spontanous Breathing and Patient connected to nasal cannula oxygen  Post-op Assessment: Report given to RN and Post -op Vital signs reviewed and stable  Post vital signs: Reviewed and stable  Last Vitals:  Vitals Value Taken Time  BP 96/53   Temp    Pulse 54   Resp 15   SpO2 93     Last Pain:  Vitals:   08/25/20 1232  TempSrc: Tympanic  PainSc:          Complications: No complications documented.

## 2020-08-25 NOTE — Progress Notes (Signed)
Pt transported by care link back to [facility] hospital

## 2020-08-25 NOTE — Progress Notes (Signed)
Patient ID: Shelly Bowers, female   DOB: May 25, 1950, 70 y.o.   MRN: [id number]  PROGRESS NOTE    Shelly Bowers  XBW:[id number] DOB: 06-14-1950 DOA: 08/23/2020 PCP: Isaac Bliss, Rayford Halsted, MD   Brief Narrative:  70 y.o. female with history of hypertension and hyperlipidemia presented with worsening shortness of breath and elevated heart rate.  On presentation, she was found to be in A. fib with RVR with chest x-ray showing cardiomegaly and congestion and BNP was 305.  COVID test was negative.  She was given a dose of IV Cardizem.  Cardiology was consulted.  Assessment & Plan:   New onset paroxysmal A. fib with RVR -Cardiology following.  Currently on Cardizem drip and oral metoprolol.  Cardiology planning for TEE DCCV today.  Patient was initially hesitant to proceed with this procedure and was almost ready to sign out [place] but has subsequently agreed to stay.   -Continue Eliquis.  Possible acute diastolic CHF -Strict input and output.  Daily weights.  Fluid restriction.  Received a dose of IV Lasix on 08/24/2020.  Follow further recommendations from cardiology regarding need for more Lasix.  Hypertension -Blood pressure elevated.  Continue metoprolol and IV Cardizem for now  Hyperlipidemia -Continue statin  Hypokalemia - replace.  Repeat a.m. labs  Macrocytosis -Check B63, folic acid levels in a.m.     DVT prophylaxis: Eliquis Code Status: Full Family Communication: None at bedside Disposition Plan: Status is: Inpatient  Remains inpatient appropriate because:Inpatient level of care appropriate due to severity of illness   Dispo: The patient is from: Home              Anticipated d/c is to: Home              Patient currently is not medically stable to d/c.   Difficult to place patient No   Consultants: Cardiology  Procedures: Echo  Antimicrobials: None   Subjective: Patient seen and examined at bedside.  Complains of intermittent  cough.  Denies palpitations or chest pain or worsening shortness of breath.  No overnight fever or vomiting reported.  Feels intermittently anxious.  Objective: Vitals:   08/24/20 2139 08/25/20 0000 08/25/20 0205 08/25/20 0443  BP: (!) 126/92  (!) 123/102 (!) 141/112  Pulse: 85  76 88  Resp: 20 18 20 17   Temp: 98 F (36.7 C)  97.8 F (36.6 C) 98.3 F (36.8 C)  TempSrc: Oral  Oral Oral  SpO2: 95%  95% 91%  Weight:      Height:        Intake/Output Summary (Last 24 hours) at 08/25/2020 0814 Last data filed at 08/24/2020 2353 Gross per 24 hour  Intake 117.36 ml  Output 75 ml  Net 42.36 ml   Filed Weights   08/23/20 1832  Weight: 83.9 kg    Examination:  General exam: Appears calm and comfortable.  Currently on room air. Respiratory system: Bilateral decreased breath sounds at bases with intermittent tachypnea Cardiovascular system: S1 & S2 heard, tachycardic Gastrointestinal system: Abdomen is nondistended, soft and nontender. Normal bowel sounds heard. Extremities: No cyanosis, clubbing; trace lower extremity edema Central nervous system: Alert and oriented. No focal neurological deficits. Moving extremities Skin: No rashes, lesions or ulcers Psychiatry: Intermittently gets anxious.    Data Reviewed: I have personally reviewed following labs and imaging studies  CBC: Recent Labs  Lab 08/23/20 1854 08/24/20 0428  WBC 6.6 6.5  NEUTROABS 4.1  --   HGB 12.7 11.9*  HCT 39.2  36.2  MCV 106.5* 105.5*  PLT 197 989   Basic Metabolic Panel: Recent Labs  Lab 08/23/20 1854 08/23/20 2020 08/24/20 0428  NA 137  --  136  K 3.5  --  3.3*  CL 100  --  99  CO2 27  --  25  GLUCOSE 111*  --  110*  BUN 17  --  16  CREATININE 0.75  --  0.82  CALCIUM 9.5  --  9.1  MG  --  2.0  --    GFR: Estimated Creatinine Clearance: 67.6 mL/min (by C-G formula based on SCr of 0.82 mg/dL). Liver Function Tests: Recent Labs  Lab 08/23/20 1854  AST 37  ALT 42  ALKPHOS 84  BILITOT  1.2  PROT 6.9  ALBUMIN 4.5   No results for input(s): LIPASE, AMYLASE in the last 168 hours. No results for input(s): AMMONIA in the last 168 hours. Coagulation Profile: No results for input(s): INR, PROTIME in the last 168 hours. Cardiac Enzymes: No results for input(s): CKTOTAL, CKMB, CKMBINDEX, TROPONINI in the last 168 hours. BNP (last 3 results) No results for input(s): PROBNP in the last 8760 hours. HbA1C: No results for input(s): HGBA1C in the last 72 hours. CBG: No results for input(s): GLUCAP in the last 168 hours. Lipid Profile: No results for input(s): CHOL, HDL, LDLCALC, TRIG, CHOLHDL, LDLDIRECT in the last 72 hours. Thyroid Function Tests: Recent Labs    08/23/20 2142  TSH 2.304   Anemia Panel: No results for input(s): VITAMINB12, FOLATE, FERRITIN, TIBC, IRON, RETICCTPCT in the last 72 hours. Sepsis Labs: No results for input(s): PROCALCITON, LATICACIDVEN in the last 168 hours.  Recent Results (from the past 240 hour(s))  Resp Panel by RT-PCR (Flu A&B, Covid) Nasopharyngeal Swab     Status: None   Collection Time: 08/23/20  6:54 PM   Specimen: Nasopharyngeal Swab; Nasopharyngeal(NP) swabs in vial transport medium  Result Value Ref Range Status   SARS Coronavirus 2 by RT PCR NEGATIVE NEGATIVE Final    Comment: (NOTE) SARS-CoV-2 target nucleic acids are NOT DETECTED.  The SARS-CoV-2 RNA is generally detectable in upper respiratory specimens during the acute phase of infection. The lowest concentration of SARS-CoV-2 viral copies this assay can detect is 138 copies/mL. A negative result does not preclude SARS-Cov-2 infection and should not be used as the sole basis for treatment or other patient management decisions. A negative result may occur with  improper specimen collection/handling, submission of specimen other than nasopharyngeal swab, presence of viral mutation(s) within the areas targeted by this assay, and inadequate number of viral copies(<138  copies/mL). A negative result must be combined with clinical observations, patient history, and epidemiological information. The expected result is Negative.  Fact Sheet for Patients:  EntrepreneurPulse.com.au  Fact Sheet for Healthcare Providers:  IncredibleEmployment.be  This test is no t yet approved or cleared by the [place] FDA and  has been authorized for detection and/or diagnosis of SARS-CoV-2 by FDA under an Emergency Use Authorization (EUA). This EUA will remain  in effect (meaning this test can be used) for the duration of the COVID-19 declaration under Section 564(b)(1) of the Act, 21 U.S.C.section 360bbb-3(b)(1), unless the authorization is terminated  or revoked sooner.       Influenza A by PCR NEGATIVE NEGATIVE Final   Influenza B by PCR NEGATIVE NEGATIVE Final    Comment: (NOTE) The Xpert Xpress SARS-CoV-2/FLU/RSV plus assay is intended as an aid in the diagnosis of influenza from Nasopharyngeal swab specimens and  should not be used as a sole basis for treatment. Nasal washings and aspirates are unacceptable for Xpert Xpress SARS-CoV-2/FLU/RSV testing.  Fact Sheet for Patients: EntrepreneurPulse.com.au  Fact Sheet for Healthcare Providers: IncredibleEmployment.be  This test is not yet approved or cleared by the [place] FDA and has been authorized for detection and/or diagnosis of SARS-CoV-2 by FDA under an Emergency Use Authorization (EUA). This EUA will remain in effect (meaning this test can be used) for the duration of the COVID-19 declaration under Section 564(b)(1) of the Act, 21 U.S.C. section 360bbb-3(b)(1), unless the authorization is terminated or revoked.  Performed at [facility], [place] [address] [place], [place] [address]          Radiology Studies: DG Chest 2 View  Result Date: 08/23/2020 CLINICAL DATA:  70 year old female with  tachycardia. EXAM: CHEST - 2 VIEW COMPARISON:  Chest radiograph dated 05/15/2016. FINDINGS: There is cardiomegaly with vascular congestion. No focal consolidation, pleural effusion, or pneumothorax. Bibasilar atelectasis. Degenerative changes of the spine. No acute osseous pathology. IMPRESSION: Cardiomegaly with vascular congestion. No focal consolidation. Electronically Signed   By: Anner Crete M.D.   On: 08/23/2020 20:42   ECHOCARDIOGRAM COMPLETE  Result Date: 08/24/2020    ECHOCARDIOGRAM REPORT   Patient Name:   Shelly Bowers Date of Exam: 08/24/2020 Medical Rec #:  [id number]         Height:       66.0 in Accession #:    [id number]        Weight:       185.0 lb Date of Birth:  [birth year]         BSA:          1.935 m Patient Age:    80 years          BP:           124/100 mmHg Patient Gender: F                 HR:           130 bpm. Exam Location:  Inpatient Procedure: 2D Echo, Cardiac Doppler, Color Doppler and Intracardiac            Opacification Agent Indications:    I48.91* Unspeicified atrial fibrillation; I50.40* Unspecified                 combined systolic (congestive) and diastolic (congestive) heart                 failure  History:        Patient has prior history of Echocardiogram examinations, most                 recent 08/07/2019. Abnormal ECG, Arrythmias:Atrial Fibrillation,                 Signs/Symptoms:Chest Pain; Risk Factors:Hypertension and                 Dyslipidemia.  Sonographer:    Roseanna Rainbow RDCS Referring Phys: [place]  Sonographer Comments: Technically challenging study due to limited acoustic windows, Technically difficult study due to poor echo windows, suboptimal parasternal window and suboptimal apical window. Suboptimal machine. IMPRESSIONS  1. Left ventricular ejection fraction, by estimation, is 30 to 35%. The left ventricle has moderately decreased function. The left ventricle demonstrates global hypokinesis. There is mild left ventricular  hypertrophy. Left ventricular diastolic parameters are indeterminate.  2. Right ventricular systolic function is mildly reduced. The  right ventricular size is mildly enlarged. Tricuspid regurgitation signal is inadequate for assessing PA pressure.  3. Left atrial size was mild to moderately dilated.  4. Right atrial size was mildly dilated.  5. The mitral valve is degenerative. Mild mitral valve regurgitation. No evidence of mitral stenosis.  6. The aortic valve is grossly normal. Aortic valve regurgitation is not visualized. No aortic stenosis is present.  7. The inferior vena cava is dilated in size with <50% respiratory variability, suggesting right atrial pressure of 15 mmHg. FINDINGS  Left Ventricle: Left ventricular ejection fraction, by estimation, is 30 to 35%. The left ventricle has moderately decreased function. The left ventricle demonstrates global hypokinesis. Definity contrast agent was given IV to delineate the left ventricular endocardial borders. The left ventricular internal cavity size was normal in size. There is mild left ventricular hypertrophy. Left ventricular diastolic parameters are indeterminate. Right Ventricle: The right ventricular size is mildly enlarged. No increase in right ventricular wall thickness. Right ventricular systolic function is mildly reduced. Tricuspid regurgitation signal is inadequate for assessing PA pressure. Left Atrium: Left atrial size was mild to moderately dilated. Right Atrium: Right atrial size was mildly dilated. Pericardium: Trivial pericardial effusion is present. Presence of pericardial fat pad. Mitral Valve: The mitral valve is degenerative in appearance. Mild to moderate mitral annular calcification. Mild mitral valve regurgitation. No evidence of mitral valve stenosis. Tricuspid Valve: The tricuspid valve is normal in structure. Tricuspid valve regurgitation is trivial. No evidence of tricuspid stenosis. Aortic Valve: The aortic valve is grossly normal.  Aortic valve regurgitation is not visualized. No aortic stenosis is present. Pulmonic Valve: The pulmonic valve was not well visualized. Pulmonic valve regurgitation is trivial. No evidence of pulmonic stenosis. Aorta: The aortic root is normal in size and structure. Venous: The inferior vena cava is dilated in size with less than 50% respiratory variability, suggesting right atrial pressure of 15 mmHg. IAS/Shunts: No atrial level shunt detected by color flow Doppler.  LEFT VENTRICLE PLAX 2D LVIDd:         4.35 cm LVIDs:         3.35 cm LV PW:         1.03 cm LV IVS:        1.01 cm LVOT diam:     1.80 cm LV SV:         32 LV SV Index:   16 LVOT Area:     2.54 cm  LV Volumes (MOD) LV vol d, MOD A2C: 71.7 ml LV vol d, MOD A4C: 74.6 ml LV vol s, MOD A2C: 47.4 ml LV vol s, MOD A4C: 47.7 ml LV SV MOD A2C:     24.3 ml LV SV MOD A4C:     74.6 ml LV SV MOD BP:      26.4 ml RIGHT VENTRICLE            IVC RV S prime:     8.49 cm/s  IVC diam: 3.01 cm TAPSE (M-mode): 1.6 cm LEFT ATRIUM             Index       RIGHT ATRIUM           Index LA diam:        3.90 cm 2.02 cm/m  RA Area:     16.10 cm LA Vol (A2C):   60.3 ml 31.17 ml/m RA Volume:   39.50 ml  20.42 ml/m LA Vol (A4C):   97.9 ml 50.60 ml/m LA Biplane  Vol: 78.3 ml 40.47 ml/m  AORTIC VALVE             PULMONIC VALVE LVOT Vmax:   81.30 cm/s  PR End Diast Vel: 1.62 msec LVOT Vmean:  61.200 cm/s LVOT VTI:    0.124 m  AORTA Ao Root diam: 3.00 cm Ao Asc diam:  3.40 cm MITRAL VALVE MV Area (PHT): 6.29 cm    SHUNTS MV Decel Time: 121 msec    Systemic VTI:  0.12 m MV E velocity: 91.43 cm/s  Systemic Diam: 1.80 cm Cherlynn Kaiser MD Electronically signed by Cherlynn Kaiser MD Signature Date/Time: 08/24/2020/2:39:50 PM    Final         Scheduled Meds: . apixaban  5 mg Oral BID  . atorvastatin  10 mg Oral Daily  . metoprolol tartrate  25 mg Oral TID   Continuous Infusions: . diltiazem (CARDIZEM) infusion 10 mg/hr (08/24/20 2353)          Aline August,  MD [facility] 08/25/2020, 8:14 AM

## 2020-08-25 NOTE — Progress Notes (Signed)
Pt arrived  by care link back to [facility].  Pt aox4. Denies any pain or discomfort.

## 2020-08-25 NOTE — Anesthesia Procedure Notes (Signed)
Procedure Name: MAC Date/Time: 08/25/2020 1:10 PM Performed by: Myna Bright, CRNA Pre-anesthesia Checklist: Patient identified, Emergency Drugs available, Suction available and Patient being monitored Patient Re-evaluated:Patient Re-evaluated prior to induction Oxygen Delivery Method: Nasal cannula Preoxygenation: Pre-oxygenation with 100% oxygen Induction Type: IV induction Placement Confirmation: positive ETCO2 Dental Injury: Teeth and Oropharynx as per pre-operative assessment

## 2020-08-25 NOTE — Progress Notes (Signed)
carelink at bedside to transport pt to [place]. Cardiologist Hochrien currently at bedside.  Husband at bedside.

## 2020-08-25 NOTE — Progress Notes (Signed)
  Echocardiogram Echocardiogram Transesophageal has been performed.  Shelly Bowers M 08/25/2020, 3:36 PM

## 2020-08-25 NOTE — Progress Notes (Signed)
Alert and oriented x 4. Denies pain or discomfort. Continued on Cardizem drip and within therapeutic HR.  She is anxious about procedure and is currently refusing TEE and all lab. Pt is requesting to be discharged.  Charge Nurse Megan made aware and at bedside.   RN will send provider a message.

## 2020-08-25 NOTE — CV Procedure (Signed)
   TRANSESOPHAGEAL ECHOCARDIOGRAM GUIDED DIRECT CURRENT CARDIOVERSION  NAME:  Shelly Bowers    MRN: [id number] DOB:  1950/09/02    ADMIT DATE: 08/23/2020  INDICATIONS: Symptomatic atrial fibrillation  PROCEDURE:   Informed consent was obtained prior to the procedure. The risks, benefits and alternatives for the procedure were discussed and the patient comprehended these risks.  Risks include, but are not limited to, cough, sore throat, vomiting, nausea, somnolence, esophageal and stomach trauma or perforation, bleeding, low blood pressure, aspiration, pneumonia, infection, trauma to the teeth and death.    After a procedural time-out, the oropharynx was anesthetized and the patient was sedated by the anesthesia service. The transesophageal probe was inserted in the esophagus and stomach without difficulty and multiple views were obtained. Anesthesia was monitored by Dr. Verita Lamb.   COMPLICATIONS:    Complications: No complications Patient tolerated procedure well.  KEY FINDINGS:  HFrEF- EF 30% Mild MR, Mild TR No LAA Thrombus Full Report to follow.   CARDIOVERSION:     Indications:  Symptomatic Atrial Fibrillation  Procedure Details:  Once the TEE was complete, the patient had the defibrillator pads placed in the anterior and posterior position. Once an appropriate level of sedation was confirmed, the patient was cardioverted x 1 with 200J of biphasic synchronized energy.  The patient converted to NSR.  There were no apparent complications.  The patient had normal neuro status and respiratory status post procedure with vitals stable as recorded elsewhere.  Adequate airway was maintained throughout and vital signs monitored per protocol.  Sedation: 450 mg propofol 40 mg lidocaine  Rudean Haskell, MD   [facility]  1:53 PM

## 2020-08-25 NOTE — Progress Notes (Addendum)
Progress Note  Patient Name: Shelly Bowers Date of Encounter: 08/25/2020  [facility] Cardiologist: Minus Breeding, MD   Subjective   Notified by primary team and RN that patient wishes to leave AMA. I spoke with her and she is very upset that she has been here since Tues in rooms without windows. She states she is not anxious about the procedure, but "can't stay here any longer."  She described nausea and feeling like she is smothering when she tries to sleep. I discussed that these symptoms are likely due to her Afib RVR and CHF.   Upon further discussion, I have convinced her to stay for cardioversion. However, she has had some saltine crackers at 3AM and sips of water until about 8AM. Awaiting confirmation for procedure from Dr. Gasper Sells.  She has a nonproductive cough, but able to lay flat.  Inpatient Medications    Scheduled Meds: . apixaban  5 mg Oral BID  . atorvastatin  10 mg Oral Daily  . metoprolol tartrate  25 mg Oral TID  . pantoprazole  40 mg Oral Daily   Continuous Infusions:  PRN Meds: acetaminophen, guaiFENesin-dextromethorphan   Vital Signs    Vitals:   08/24/20 2139 08/25/20 0000 08/25/20 0205 08/25/20 0443  BP: (!) 126/92  (!) 123/102 (!) 141/112  Pulse: 85  76 88  Resp: 20 18 20 17   Temp: 98 F (36.7 C)  97.8 F (36.6 C) 98.3 F (36.8 C)  TempSrc: Oral  Oral Oral  SpO2: 95%  95% 91%  Weight:      Height:        Intake/Output Summary (Last 24 hours) at 08/25/2020 0936 Last data filed at 08/24/2020 2353 Gross per 24 hour  Intake 117.36 ml  Output 75 ml  Net 42.36 ml   Last 3 Weights 08/23/2020 01/22/2020 09/22/2019  Weight (lbs) 185 lb 185 lb 11.2 oz 181 lb 9.6 oz  Weight (kg) 83.915 kg 84.233 kg 82.373 kg      Telemetry    afib heart rates in the 120s - Personally Reviewed  ECG    No new tracings - Personally Reviewed  Physical Exam   GEN: No acute distress.   Neck: No JVD Cardiac: irregular rhythm, tachycardic  rate Respiratory: Clear to auscultation bilaterally, nonproductive cough GI: Soft, nontender, non-distended  MS: minimal edema; No deformity. Neuro:  Nonfocal  Psych: Normal affect   Labs    High Sensitivity Troponin:   Recent Labs  Lab 08/23/20 2020 08/23/20 2220  TROPONINIHS 10 11      Chemistry Recent Labs  Lab 08/23/20 1854 08/24/20 0428  NA 137 136  K 3.5 3.3*  CL 100 99  CO2 27 25  GLUCOSE 111* 110*  BUN 17 16  CREATININE 0.75 0.82  CALCIUM 9.5 9.1  PROT 6.9  --   ALBUMIN 4.5  --   AST 37  --   ALT 42  --   ALKPHOS 84  --   BILITOT 1.2  --   GFRNONAA >60 >60  ANIONGAP 10 12     Hematology Recent Labs  Lab 08/23/20 1854 08/24/20 0428  WBC 6.6 6.5  RBC 3.68* 3.43*  HGB 12.7 11.9*  HCT 39.2 36.2  MCV 106.5* 105.5*  MCH 34.5* 34.7*  MCHC 32.4 32.9  RDW 13.6 13.7  PLT 197 180    BNP Recent Labs  Lab 08/23/20 2020  BNP 370.5*     DDimer  Recent Labs  Lab 08/24/20 0428  DDIMER  1.73*     Radiology    DG Chest 2 View  Result Date: 08/23/2020 CLINICAL DATA:  70 year old female with tachycardia. EXAM: CHEST - 2 VIEW COMPARISON:  Chest radiograph dated 05/15/2016. FINDINGS: There is cardiomegaly with vascular congestion. No focal consolidation, pleural effusion, or pneumothorax. Bibasilar atelectasis. Degenerative changes of the spine. No acute osseous pathology. IMPRESSION: Cardiomegaly with vascular congestion. No focal consolidation. Electronically Signed   By: Anner Crete M.D.   On: 08/23/2020 20:42   ECHOCARDIOGRAM COMPLETE  Result Date: 08/24/2020    ECHOCARDIOGRAM REPORT   Patient Name:   Shelly Bowers Date of Exam: 08/24/2020 Medical Rec #:  [id number]         Height:       66.0 in Accession #:    [id number]        Weight:       185.0 lb Date of Birth:  1950/08/27         BSA:          1.935 m Patient Age:    70 years          BP:           124/100 mmHg Patient Gender: F                 HR:           130 bpm. Exam Location:   Inpatient Procedure: 2D Echo, Cardiac Doppler, Color Doppler and Intracardiac            Opacification Agent Indications:    I48.91* Unspeicified atrial fibrillation; I50.40* Unspecified                 combined systolic (congestive) and diastolic (congestive) heart                 failure  History:        Patient has prior history of Echocardiogram examinations, most                 recent 08/07/2019. Abnormal ECG, Arrythmias:Atrial Fibrillation,                 Signs/Symptoms:Chest Pain; Risk Factors:Hypertension and                 Dyslipidemia.  Sonographer:    Roseanna Rainbow RDCS Referring Phys: [place]  Sonographer Comments: Technically challenging study due to limited acoustic windows, Technically difficult study due to poor echo windows, suboptimal parasternal window and suboptimal apical window. Suboptimal machine. IMPRESSIONS  1. Left ventricular ejection fraction, by estimation, is 30 to 35%. The left ventricle has moderately decreased function. The left ventricle demonstrates global hypokinesis. There is mild left ventricular hypertrophy. Left ventricular diastolic parameters are indeterminate.  2. Right ventricular systolic function is mildly reduced. The right ventricular size is mildly enlarged. Tricuspid regurgitation signal is inadequate for assessing PA pressure.  3. Left atrial size was mild to moderately dilated.  4. Right atrial size was mildly dilated.  5. The mitral valve is degenerative. Mild mitral valve regurgitation. No evidence of mitral stenosis.  6. The aortic valve is grossly normal. Aortic valve regurgitation is not visualized. No aortic stenosis is present.  7. The inferior vena cava is dilated in size with <50% respiratory variability, suggesting right atrial pressure of 15 mmHg. FINDINGS  Left Ventricle: Left ventricular ejection fraction, by estimation, is 30 to 35%. The left ventricle has moderately decreased function. The left ventricle demonstrates global  hypokinesis. Definity contrast  agent was given IV to delineate the left ventricular endocardial borders. The left ventricular internal cavity size was normal in size. There is mild left ventricular hypertrophy. Left ventricular diastolic parameters are indeterminate. Right Ventricle: The right ventricular size is mildly enlarged. No increase in right ventricular wall thickness. Right ventricular systolic function is mildly reduced. Tricuspid regurgitation signal is inadequate for assessing PA pressure. Left Atrium: Left atrial size was mild to moderately dilated. Right Atrium: Right atrial size was mildly dilated. Pericardium: Trivial pericardial effusion is present. Presence of pericardial fat pad. Mitral Valve: The mitral valve is degenerative in appearance. Mild to moderate mitral annular calcification. Mild mitral valve regurgitation. No evidence of mitral valve stenosis. Tricuspid Valve: The tricuspid valve is normal in structure. Tricuspid valve regurgitation is trivial. No evidence of tricuspid stenosis. Aortic Valve: The aortic valve is grossly normal. Aortic valve regurgitation is not visualized. No aortic stenosis is present. Pulmonic Valve: The pulmonic valve was not well visualized. Pulmonic valve regurgitation is trivial. No evidence of pulmonic stenosis. Aorta: The aortic root is normal in size and structure. Venous: The inferior vena cava is dilated in size with less than 50% respiratory variability, suggesting right atrial pressure of 15 mmHg. IAS/Shunts: No atrial level shunt detected by color flow Doppler.  LEFT VENTRICLE PLAX 2D LVIDd:         4.35 cm LVIDs:         3.35 cm LV PW:         1.03 cm LV IVS:        1.01 cm LVOT diam:     1.80 cm LV SV:         32 LV SV Index:   16 LVOT Area:     2.54 cm  LV Volumes (MOD) LV vol d, MOD A2C: 71.7 ml LV vol d, MOD A4C: 74.6 ml LV vol s, MOD A2C: 47.4 ml LV vol s, MOD A4C: 47.7 ml LV SV MOD A2C:     24.3 ml LV SV MOD A4C:     74.6 ml LV SV MOD BP:       26.4 ml RIGHT VENTRICLE            IVC RV S prime:     8.49 cm/s  IVC diam: 3.01 cm TAPSE (M-mode): 1.6 cm LEFT ATRIUM             Index       RIGHT ATRIUM           Index LA diam:        3.90 cm 2.02 cm/m  RA Area:     16.10 cm LA Vol (A2C):   60.3 ml 31.17 ml/m RA Volume:   39.50 ml  20.42 ml/m LA Vol (A4C):   97.9 ml 50.60 ml/m LA Biplane Vol: 78.3 ml 40.47 ml/m  AORTIC VALVE             PULMONIC VALVE LVOT Vmax:   81.30 cm/s  PR End Diast Vel: 1.62 msec LVOT Vmean:  61.200 cm/s LVOT VTI:    0.124 m  AORTA Ao Root diam: 3.00 cm Ao Asc diam:  3.40 cm MITRAL VALVE MV Area (PHT): 6.29 cm    SHUNTS MV Decel Time: 121 msec    Systemic VTI:  0.12 m MV E velocity: 91.43 cm/s  Systemic Diam: 1.80 cm Cherlynn Kaiser MD Electronically signed by Cherlynn Kaiser MD Signature Date/Time: 08/24/2020/2:39:50 PM    Final     Cardiac Studies  Echo 08/24/20: 1. Left ventricular ejection fraction, by estimation, is 30 to 35%. The  left ventricle has moderately decreased function. The left ventricle  demonstrates global hypokinesis. There is mild left ventricular  hypertrophy. Left ventricular diastolic  parameters are indeterminate.  2. Right ventricular systolic function is mildly reduced. The right  ventricular size is mildly enlarged. Tricuspid regurgitation signal is  inadequate for assessing PA pressure.  3. Left atrial size was mild to moderately dilated.  4. Right atrial size was mildly dilated.  5. The mitral valve is degenerative. Mild mitral valve regurgitation. No  evidence of mitral stenosis.  6. The aortic valve is grossly normal. Aortic valve regurgitation is not  visualized. No aortic stenosis is present.  7. The inferior vena cava is dilated in size with <50% respiratory  variability, suggesting right atrial pressure of 15 mmHg.   Patient Profile     70 y.o. female with a hx of palpitations, mild dyslipidemia, HTN, and OA who is being seen 08/24/2020 for the evaluation of Afib RVR.  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   Assessment & Plan    Atrial fibrillation with RVR - D/C'ed cardizem gtt due to reduced EF - metoprolol 25 mg TID --> will increase this to 50 mg TID if rates increase significantly with discontinuation of cardizem gtt - plan for TEE-DCCV today    Chronic anticoagulation - continue eliquis This patients CHA2DS2-VASc Score and unadjusted Ischemic Stroke Rate (% per year) is equal to 4.8 % stroke rate/year from a score of 4 (CHF, female, age, HTN   Cough - COVID negative this admission - she complains of fatigue, nausea, and dry cough --> symptoms may be related to underlying viral URI and exacerbated by RVR and CHF - continue supportive care   Acute systolic heart failure Mildly reduced RV function - BNP elevated and congestion on CXR - echo yesterday with EF 30-35%, mildly reduced RV function - will reorder 40 mg IV lasix daily - no weight or I&Os - increase BB for rate control - if BP tolerates, would initiate ARNI - plan to repeat echo after GDMT maximized and restoration of NSR - she denies chest pain - suspect a component of tachycardia mediated CM, can consider ischemic evaluation OP    Hyperlipidemia  01/22/2020: Cholesterol 219; HDL 71; LDL Cholesterol (Calc) 125; Triglycerides 115 - recommend LDL less than at least 100 - continue 10 mg lipitor     For questions or updates, please contact [facility] Please consult [email or web address] for contact info under        Signed, Ledora Bottcher, PA  08/25/2020, 9:36 AM    History and all data above reviewed.  Patient examined.  I agree with the findings as above.   The patient is very agitated about being in the hospital for two days.  She wanted to leave as above but does consent to TEE/DCCV.   The patient exam reveals [id number]   ,  Lungs: Clear  ,  Abd: Positive bowel sounds, no rebound no guarding, Ext No edema  .  All available labs, radiology testing, previous  records reviewed. Agree with documented assessment and plan.   Atrial fib:  Rate is still elevated despite beta blocker.  Was controlled on IV dilt.  On Eliquis.  TEE/DCCV today.  Acute systolic HF:  Possibly tachy mediated.  DCCV today.  Start Enteresto.  We can consolidate the beta blocker before discharge  .   Jeneen Rinks Malanie Koloski  11:27  AM  08/25/2020

## 2020-08-26 ENCOUNTER — Encounter (HOSPITAL_COMMUNITY): Payer: Self-pay | Admitting: Internal Medicine

## 2020-08-26 DIAGNOSIS — I10 Essential (primary) hypertension: Secondary | ICD-10-CM | POA: Diagnosis not present

## 2020-08-26 DIAGNOSIS — I5021 Acute systolic (congestive) heart failure: Secondary | ICD-10-CM | POA: Diagnosis not present

## 2020-08-26 DIAGNOSIS — E785 Hyperlipidemia, unspecified: Secondary | ICD-10-CM | POA: Diagnosis not present

## 2020-08-26 DIAGNOSIS — I4891 Unspecified atrial fibrillation: Secondary | ICD-10-CM | POA: Diagnosis not present

## 2020-08-26 LAB — BASIC METABOLIC PANEL
Anion gap: 8 (ref 5–15)
BUN: 15 mg/dL (ref 8–23)
CO2: 25 mmol/L (ref 22–32)
Calcium: 8.8 mg/dL — ABNORMAL LOW (ref 8.9–10.3)
Chloride: 96 mmol/L — ABNORMAL LOW (ref 98–111)
Creatinine, Ser: 0.82 mg/dL (ref 0.44–1.00)
GFR, Estimated: >60 mL/min (ref >60)
Glucose, Bld: 108 mg/dL — ABNORMAL HIGH (ref 70–99)
Potassium: 3.5 mmol/L (ref 3.5–5.1)
Sodium: 129 mmol/L — ABNORMAL LOW (ref 135–145)

## 2020-08-26 LAB — MAGNESIUM: Magnesium: 1.6 mg/dL — ABNORMAL LOW (ref 1.7–2.4)

## 2020-08-26 MED ORDER — APIXABAN 5 MG PO TABS: 5.0000 mg | ORAL_TABLET | Freq: Two times a day (BID) | ORAL | 0 refills | Status: DC

## 2020-08-26 MED ORDER — MAGNESIUM SULFATE 2 GM/50ML IV SOLN
2.0000 g | Freq: Once | INTRAVENOUS | Status: AC
  Administered 2020-08-26: 2 g via INTRAVENOUS
  Filled 2020-08-26: qty 50

## 2020-08-26 MED ORDER — METOPROLOL SUCCINATE ER 25 MG PO TB24
25.0000 mg | ORAL_TABLET | Freq: Every day | ORAL | Status: DC
  Administered 2020-08-26: 25 mg via ORAL
  Filled 2020-08-26: qty 1

## 2020-08-26 MED ORDER — MAGNESIUM OXIDE -MG SUPPLEMENT 400 (240 MG) MG PO TABS: 400.0000 mg | ORAL_TABLET | Freq: Two times a day (BID) | ORAL | 0 refills | Status: AC

## 2020-08-26 MED ORDER — SACUBITRIL-VALSARTAN 24-26 MG PO TABS: 1.0000 | ORAL_TABLET | Freq: Two times a day (BID) | ORAL | 0 refills | Status: DC

## 2020-08-26 MED ORDER — MAGNESIUM OXIDE -MG SUPPLEMENT 400 (240 MG) MG PO TABS
400.0000 mg | ORAL_TABLET | Freq: Two times a day (BID) | ORAL | Status: DC
  Administered 2020-08-26: 400 mg via ORAL
  Filled 2020-08-26: qty 1

## 2020-08-26 MED ORDER — METOPROLOL SUCCINATE ER 25 MG PO TB24: 25.0000 mg | ORAL_TABLET | Freq: Every day | ORAL | 0 refills | Status: DC

## 2020-08-26 MED ORDER — POTASSIUM CHLORIDE CRYS ER 20 MEQ PO TBCR
40.0000 meq | EXTENDED_RELEASE_TABLET | Freq: Once | ORAL | Status: AC
  Administered 2020-08-26: 40 meq via ORAL
  Filled 2020-08-26: qty 2

## 2020-08-26 MED ORDER — FUROSEMIDE 20 MG PO TABS: 20.0000 mg | ORAL_TABLET | Freq: Every day | ORAL | 0 refills | Status: DC | PRN

## 2020-08-26 NOTE — Progress Notes (Addendum)
Progress Note  Patient Name: Shelly Bowers Date of Encounter: 08/26/2020  [facility] Cardiologist: Minus Breeding, MD   Subjective   Patient states she is feeling much improved. She denied any chest pain, states her SOB, congestion, cough are much improved. She feels well. She is asking when can she go home. She wishes to follow up with Dr. Percival Spanish office.   Inpatient Medications    Scheduled Meds: . apixaban  5 mg Oral BID  . atorvastatin  10 mg Oral Daily  . melatonin  3 mg Oral QHS  . metoprolol succinate  25 mg Oral Daily  . pantoprazole  40 mg Oral Daily  . sacubitril-valsartan  1 tablet Oral BID   Continuous Infusions:  PRN Meds: acetaminophen, ALPRAZolam, guaiFENesin-dextromethorphan   Vital Signs    Vitals:   08/25/20 1410 08/25/20 1525 08/25/20 2120 08/26/20 0552  BP: (!) 151/88 (!) 143/94 (!) 153/94 121/77  Pulse: 62 73 73 (!) 57  Resp: 20 20 18 18   Temp:  97.8 F (36.6 C) 98.2 F (36.8 C) 98.3 F (36.8 C)  TempSrc:  Oral  Oral  SpO2: 95% 97% 92% 95%  Weight:    85.6 kg  Height:        Intake/Output Summary (Last 24 hours) at 08/26/2020 0727 Last data filed at 08/25/2020 1333 Gross per 24 hour  Intake 100 ml  Output --  Net 100 ml   Last 3 Weights 08/26/2020 08/23/2020 01/22/2020  Weight (lbs) 188 lb 11.4 oz 185 lb 185 lb 11.2 oz  Weight (kg) 85.6 kg 83.915 kg 84.233 kg      Telemetry    Sinus rhythm, intermittent ventricular bigeminy, early morning bradycardia high 50s noted - Personally Reviewed  ECG    No new tracings - Personally Reviewed  Physical Exam   GEN: No acute distress.   Neck: No JVD Cardiac: RRR, S1S2, no murmur  Respiratory: Clear to auscultation bilaterally, dry cough occasionally, on room air  GI: Soft, nontender, non-distended  MS: BLE trace edema; No deformity. Neuro:  Nonfocal  Psych: Normal affect   Labs    High Sensitivity Troponin:   Recent Labs  Lab 08/23/20 2020 08/23/20 2220  TROPONINIHS 10 11       Chemistry Recent Labs  Lab 08/23/20 1854 08/24/20 0428 08/26/20 0338  NA 137 136 129*  K 3.5 3.3* 3.5  CL 100 99 96*  CO2 27 25 25   GLUCOSE 111* 110* 108*  BUN 17 16 15   CREATININE 0.75 0.82 0.82  CALCIUM 9.5 9.1 8.8*  PROT 6.9  --   --   ALBUMIN 4.5  --   --   AST 37  --   --   ALT 42  --   --   ALKPHOS 84  --   --   BILITOT 1.2  --   --   GFRNONAA >60 >60 >60  ANIONGAP 10 12 8      Hematology Recent Labs  Lab 08/23/20 1854 08/24/20 0428  WBC 6.6 6.5  RBC 3.68* 3.43*  HGB 12.7 11.9*  HCT 39.2 36.2  MCV 106.5* 105.5*  MCH 34.5* 34.7*  MCHC 32.4 32.9  RDW 13.6 13.7  PLT 197 180    BNP Recent Labs  Lab 08/23/20 2020  BNP 370.5*     DDimer  Recent Labs  Lab 08/24/20 0428  DDIMER 1.73*     Radiology    ECHOCARDIOGRAM COMPLETE  Result Date: 08/24/2020    ECHOCARDIOGRAM REPORT  Patient Name:   Shelly Bowers Date of Exam: 08/24/2020 Medical Rec #:  [id number]         Height:       66.0 in Accession #:    [id number]        Weight:       185.0 lb Date of Birth:  Apr 30, 1950         BSA:          1.935 m Patient Age:    70 years          BP:           124/100 mmHg Patient Gender: F                 HR:           130 bpm. Exam Location:  Inpatient Procedure: 2D Echo, Cardiac Doppler, Color Doppler and Intracardiac            Opacification Agent Indications:    I48.91* Unspeicified atrial fibrillation; I50.40* Unspecified                 combined systolic (congestive) and diastolic (congestive) heart                 failure  History:        Patient has prior history of Echocardiogram examinations, most                 recent 08/07/2019. Abnormal ECG, Arrythmias:Atrial Fibrillation,                 Signs/Symptoms:Chest Pain; Risk Factors:Hypertension and                 Dyslipidemia.  Sonographer:    Roseanna Rainbow RDCS Referring Phys: [place]  Sonographer Comments: Technically challenging study due to limited acoustic windows, Technically difficult study  due to poor echo windows, suboptimal parasternal window and suboptimal apical window. Suboptimal machine. IMPRESSIONS  1. Left ventricular ejection fraction, by estimation, is 30 to 35%. The left ventricle has moderately decreased function. The left ventricle demonstrates global hypokinesis. There is mild left ventricular hypertrophy. Left ventricular diastolic parameters are indeterminate.  2. Right ventricular systolic function is mildly reduced. The right ventricular size is mildly enlarged. Tricuspid regurgitation signal is inadequate for assessing PA pressure.  3. Left atrial size was mild to moderately dilated.  4. Right atrial size was mildly dilated.  5. The mitral valve is degenerative. Mild mitral valve regurgitation. No evidence of mitral stenosis.  6. The aortic valve is grossly normal. Aortic valve regurgitation is not visualized. No aortic stenosis is present.  7. The inferior vena cava is dilated in size with <50% respiratory variability, suggesting right atrial pressure of 15 mmHg. FINDINGS  Left Ventricle: Left ventricular ejection fraction, by estimation, is 30 to 35%. The left ventricle has moderately decreased function. The left ventricle demonstrates global hypokinesis. Definity contrast agent was given IV to delineate the left ventricular endocardial borders. The left ventricular internal cavity size was normal in size. There is mild left ventricular hypertrophy. Left ventricular diastolic parameters are indeterminate. Right Ventricle: The right ventricular size is mildly enlarged. No increase in right ventricular wall thickness. Right ventricular systolic function is mildly reduced. Tricuspid regurgitation signal is inadequate for assessing PA pressure. Left Atrium: Left atrial size was mild to moderately dilated. Right Atrium: Right atrial size was mildly dilated. Pericardium: Trivial pericardial effusion is present. Presence of pericardial fat pad. Mitral Valve: The mitral  valve is  degenerative in appearance. Mild to moderate mitral annular calcification. Mild mitral valve regurgitation. No evidence of mitral valve stenosis. Tricuspid Valve: The tricuspid valve is normal in structure. Tricuspid valve regurgitation is trivial. No evidence of tricuspid stenosis. Aortic Valve: The aortic valve is grossly normal. Aortic valve regurgitation is not visualized. No aortic stenosis is present. Pulmonic Valve: The pulmonic valve was not well visualized. Pulmonic valve regurgitation is trivial. No evidence of pulmonic stenosis. Aorta: The aortic root is normal in size and structure. Venous: The inferior vena cava is dilated in size with less than 50% respiratory variability, suggesting right atrial pressure of 15 mmHg. IAS/Shunts: No atrial level shunt detected by color flow Doppler.  LEFT VENTRICLE PLAX 2D LVIDd:         4.35 cm LVIDs:         3.35 cm LV PW:         1.03 cm LV IVS:        1.01 cm LVOT diam:     1.80 cm LV SV:         32 LV SV Index:   16 LVOT Area:     2.54 cm  LV Volumes (MOD) LV vol d, MOD A2C: 71.7 ml LV vol d, MOD A4C: 74.6 ml LV vol s, MOD A2C: 47.4 ml LV vol s, MOD A4C: 47.7 ml LV SV MOD A2C:     24.3 ml LV SV MOD A4C:     74.6 ml LV SV MOD BP:      26.4 ml RIGHT VENTRICLE            IVC RV S prime:     8.49 cm/s  IVC diam: 3.01 cm TAPSE (M-mode): 1.6 cm LEFT ATRIUM             Index       RIGHT ATRIUM           Index LA diam:        3.90 cm 2.02 cm/m  RA Area:     16.10 cm LA Vol (A2C):   60.3 ml 31.17 ml/m RA Volume:   39.50 ml  20.42 ml/m LA Vol (A4C):   97.9 ml 50.60 ml/m LA Biplane Vol: 78.3 ml 40.47 ml/m  AORTIC VALVE             PULMONIC VALVE LVOT Vmax:   81.30 cm/s  PR End Diast Vel: 1.62 msec LVOT Vmean:  61.200 cm/s LVOT VTI:    0.124 m  AORTA Ao Root diam: 3.00 cm Ao Asc diam:  3.40 cm MITRAL VALVE MV Area (PHT): 6.29 cm    SHUNTS MV Decel Time: 121 msec    Systemic VTI:  0.12 m MV E velocity: 91.43 cm/s  Systemic Diam: 1.80 cm Cherlynn Kaiser MD Electronically  signed by Cherlynn Kaiser MD Signature Date/Time: 08/24/2020/2:39:50 PM    Final    ECHO TEE  Result Date: 08/25/2020    TRANSESOPHOGEAL ECHO REPORT   Patient Name:   Shelly Bowers Date of Exam: 08/25/2020 Medical Rec #:  [id number]         Height:       66.0 in Accession #:    [id number]        Weight:       185.0 lb Date of Birth:  01-24-1951         BSA:          1.935 m Patient Age:    77 years  BP:           143/94 mmHg Patient Gender: F                 HR:           73 bpm. Exam Location:  Inpatient Procedure: 3D Echo, Cardiac Doppler, Color Doppler, Transesophageal Echo and            Saline Contrast Bubble Study                                MODIFIED REPORT:   This report was modified by Rudean Haskell MD on 08/25/2020 due to LVEF                                     added.  Indications:     I48.0 Paroxysmal atrial fibrillation  History:         Patient has prior history of Echocardiogram examinations, most                  recent 08/24/2020. Abnormal ECG, Arrythmias:Atrial Fibrillation,                  Signs/Symptoms:Chest Pain; Risk Factors:Hypertension and                  Dyslipidemia.  Sonographer:     Darlina Sicilian RDCS Referring Phys:  [id number] Tami Lin [facility] Diagnosing Phys: Rudean Haskell MD PROCEDURE: After discussion of the risks and benefits of a TEE, an informed consent was obtained from the patient. The transesophogeal probe was passed without difficulty through the esophogus of the patient. Imaged were obtained with the patient in a left lateral decubitus position. Sedation performed by different physician. The patient was monitored while under deep sedation. Anesthestetic sedation was provided intravenously by Anesthesiology: 4533.06mg  of Propofol, 40mg  of Lidocaine. Image quality was good. The patient's vital signs; including heart rate, blood pressure, and oxygen saturation; remained stable throughout the procedure. The patient developed no complications during the  procedure. IMPRESSIONS  1. Left ventricular ejection fraction, by estimation, is 30 to 35%. Left ventricular ejection fraction by 3D volume is 30 %. The left ventricle has moderately decreased function. The left ventricle demonstrates global hypokinesis.  2. The mitral valve is degenerative. Mild mitral valve regurgitation with atrial functional regurgitation and two jets: A1-P1 and A3-P3.  3. Left atrial size was moderately dilated. No left atrial/left atrial appendage thrombus was detected.  4. Evidence of atrial level shunting detected by color flow Doppler. Agitated saline contrast bubble study was positive with shunting observed within 3-6 cardiac cycles suggestive of interatrial shunt. There is a small patent foramen ovale.  5. Right ventricular systolic function is mildly to moderately reduced. The right ventricular size is normal.  6. Right atrial size was mildly dilated.  7. The aortic valve is tricuspid. Aortic valve regurgitation is trivial.  8. There is mild (Grade II) plaque involving the descending aorta. FINDINGS  Left Ventricle: Left ventricular ejection fraction, by estimation, is 30 to 35%. Left ventricular ejection fraction by 3D volume is 30 %. The left ventricle has moderately decreased function. The left ventricle demonstrates global hypokinesis. The left ventricular internal cavity size was normal in size. Right Ventricle: The right ventricular size is normal. No increase in right ventricular wall thickness. Right ventricular systolic function is mildly reduced. Left  Atrium: Left atrial size was moderately dilated. No left atrial/left atrial appendage thrombus was detected. Right Atrium: Right atrial size was mildly dilated. Pericardium: Trivial pericardial effusion is present. Mitral Valve: MVA 7.19 cm2. The mitral valve is degenerative in appearance. Mild mitral valve regurgitation. Tricuspid Valve: The tricuspid valve is normal in structure. Tricuspid valve regurgitation is mild. Aortic  Valve: The aortic valve is tricuspid. Aortic valve regurgitation is trivial. Pulmonic Valve: The pulmonic valve was grossly normal. Pulmonic valve regurgitation is not visualized. Aorta: The aortic root and ascending aorta are structurally normal, with no evidence of dilitation. There is mild (Grade II) plaque involving the descending aorta. Venous: The left upper pulmonary vein, left lower pulmonary vein, right upper pulmonary vein and right lower pulmonary vein are normal. IAS/Shunts: Evidence of atrial level shunting detected by color flow Doppler. Agitated saline contrast was given intravenously to evaluate for intracardiac shunting. Agitated saline contrast bubble study was positive with shunting observed within 3-6 cardiac cycles suggestive of interatrial shunt. A small patent foramen ovale is detected.  LEFT VENTRICLE PLAX 2D LVOT diam:     2.10 cm LV SV:         38 LV SV Index:   20              3D Volume EF LVOT Area:     3.46 cm        LV 3D EF:    Left                                             ventricular                                             ejection                                             fraction by                                             3D volume                                             is 30 %.                                LV 3D EDV:   58.03 ml                                LV 3D ESV:   40.31 ml                                 3D Volume EF:  3D EF:        30 % RIGHT VENTRICLE RV S prime:     9.88 cm/s AORTIC VALVE LVOT Vmax:   63.80 cm/s LVOT Vmean:  41.700 cm/s LVOT VTI:    0.109 m  AORTA Ao Root diam: 2.70 cm Ao Asc diam:  3.00 cm  SHUNTS Systemic VTI:  0.11 m Systemic Diam: 2.10 cm Rudean Haskell MD Electronically signed by Rudean Haskell MD Signature Date/Time: 08/25/2020/7:21:46 PM    Final (Updated)     Cardiac Studies   Echo 08/24/20: 1. Left ventricular ejection fraction, by estimation, is 30 to 35%. The  left ventricle has  moderately decreased function. The left ventricle  demonstrates global hypokinesis. There is mild left ventricular  hypertrophy. Left ventricular diastolic  parameters are indeterminate.  2. Right ventricular systolic function is mildly reduced. The right  ventricular size is mildly enlarged. Tricuspid regurgitation signal is  inadequate for assessing PA pressure.  3. Left atrial size was mild to moderately dilated.  4. Right atrial size was mildly dilated.  5. The mitral valve is degenerative. Mild mitral valve regurgitation. No  evidence of mitral stenosis.  6. The aortic valve is grossly normal. Aortic valve regurgitation is not  visualized. No aortic stenosis is present.  7. The inferior vena cava is dilated in size with <50% respiratory  variability, suggesting right atrial pressure of 15 mmHg.   Patient Profile     70 y.o. female with a hx of palpitations, mild dyslipidemia, HTN, and OA who is being seen 08/24/2020 for the evaluation of Afib RVR. 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   Assessment & Plan    Atrial fibrillation with RVR, new - TSH WNL 08/23/20 - s/p TEE with DCCV 6/2/22with successful conversion to NSR - bradycardia 50s noted, will transition Metoprolol 25mg  TID to Metoprolol XL 25mg  daily today  - CHA2DS2-VASc Score and unadjusted Ischemic Stroke Rate (% per year) is equal to 4.8 % stroke rate/year from a score of 4 (CHF, female, age, HTN), continue anticoagulation with Eliquis 5mg  BID  - Mag 1.6 and K 3.5 today, ordered supplement today, keep K>4 and Mag >2   Hypomagnesemia - Mag 1.6, will replace 2g IV and start 400mg  Mag Ox BID for 2 days    Acute systolic heart failure Mildly reduced RV function - BNP 370 and congestion on CXR, POA - Echo 6/2 with EF 30-35%, mildly reduced RV function - GDMT: started Metoprolol XL 25mg , Entresto 24/26 BID, can add MRA/SGLT2i outpatient  - 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    For questions or updates, please contact [place] Please consult [email or web address] for contact info under        Signed, Margie Billet, NP  08/26/2020, 7:27 AM    History and all data above reviewed.  Patient examined.  I agree with the findings as above.   She feels much better.  Breathing at baseline.  No pain.   The patient exam reveals COR:RRR with ectopy  ,  Lungs: Clear  ,  Abd: Positive bowel sounds, no rebound no guarding, Ext No edema   .  All available labs, radiology testing, previous records reviewed. Agree with documented assessment and plan.   Atrial fib:  Now in NSR.  Meds as on MAR.  Follow planned.  HF:  Likely secondary to rapid rate.  I will follow up and titrate meds.  Home with PRN Lasix.  She and I discussed that today.    Jeneen Rinks Abdulloh Ullom  10:15 AM  08/26/2020

## 2020-08-26 NOTE — Anesthesia Postprocedure Evaluation (Signed)
Anesthesia Post Note  Patient: Shelly Bowers  Procedure(s) Performed: TRANSESOPHAGEAL ECHOCARDIOGRAM (TEE) (N/A ) CARDIOVERSION (N/A ) BUBBLE STUDY     Patient location during evaluation: PACU Anesthesia Type: General Level of consciousness: awake and alert Pain management: pain level controlled Vital Signs Assessment: post-procedure vital signs reviewed and stable Respiratory status: spontaneous breathing, nonlabored ventilation, respiratory function stable and patient connected to nasal cannula oxygen Cardiovascular status: blood pressure returned to baseline and stable Postop Assessment: no apparent nausea or vomiting Anesthetic complications: no   No complications documented.  Last Vitals:  Vitals:   08/25/20 2120 08/26/20 0552  BP: (!) 153/94 121/77  Pulse: 73 (!) 57  Resp: 18 18  Temp: 36.8 C 36.8 C  SpO2: 92% 95%    Last Pain:  Vitals:   08/26/20 0847  TempSrc:   PainSc: 0-No pain                 Tiajuana Amass

## 2020-08-26 NOTE — Care Management Important Message (Signed)
Important Message  Patient Details IM Letter given to the Patient. Name: Shelly Bowers MRN: [id number] Date of Birth: [birth year]   Medicare Important Message Given:  Yes     Kerin Salen 08/26/2020, 10:07 AM

## 2020-08-26 NOTE — Discharge Summary (Signed)
Physician Discharge Summary  Shelly Bowers ZOX:[id number] DOB: October 30, 1950 DOA: 08/23/2020  PCP: Isaac Bliss, Rayford Halsted, MD  Admit date: 08/23/2020 Discharge date: 08/26/2020  Admitted From: Home Disposition: Home  Recommendations for Outpatient Follow-up:  1. Follow up with PCP in 1 week with repeat CBC/BMP 2. Outpatient follow-up with cardiology 3. Follow up in ED if symptoms worsen or new appear   Home Health: No Equipment/Devices: None  Discharge Condition: Stable CODE STATUS: Full Diet recommendation: Heart healthy/fluid restriction of up to 1200 cc a day  Brief/Interim Summary: 70 y.o.femalewithhistory of hypertension and hyperlipidemia presented with worsening shortness of breath and elevated heart rate.  On presentation, she was found to be in A. fib with RVR with chest x-ray showing cardiomegaly and congestion and BNP was 305.  COVID test was negative.  She was given a dose of IV Cardizem.  Cardiology was consulted.  During the hospitalization, she underwent TEE DCCV by cardiology on 08/25/2020.  Subsequently, her heart rate has improved.  She has already been started on Eliquis, Entresto and metoprolol by cardiology.  Cardiology has cleared the patient for discharge.  Discharge home today with outpatient follow-up with PCP and cardiology.  Discharge Diagnoses:   New onset paroxysmal A. fib with RVR -Cardiology following.    Required Cardizem drip initially but was subsequently discontinued and currently is on oral metoprolol.  Cardiology has changed the metoprolol to metoprolol succinate 25 mg daily. - Status post TEE DCCV on 08/25/2020.    Heart rate improved and intermittently bradycardic currently -Continue Eliquis.  DC aspirin.  Acute systolic CHF -Echo showed EF of 30 to 35%.  Was treated with IV Lasix daily for 2 days.  Patient has been started on Entresto which will be continued.  Continue Toprol-XL as above.  Discharged home on Lasix 20 mg daily as needed fluid  retention/swelling.  Outpatient follow-up with cardiology.  Hypertension -Antihypertensive plan as above.  Hyperlipidemia -Continue statin  Hypokalemia - Improved.  Hypomagnesemia  -Continue replacement as an outpatient  Macrocytosis -Outpatient follow-up.  Discharge Instructions  Discharge Instructions    Ambulatory referral to Cardiology   Complete by: As directed    Diet - low sodium heart healthy   Complete by: As directed    Increase activity slowly   Complete by: As directed      Allergies as of 08/26/2020      Reactions   Codeine Nausea Only   Codeine Sulfate    REACTION: unspecified   Hydrocodone Nausea And Vomiting      Medication List    STOP taking these medications   aspirin EC 81 MG tablet   hydrochlorothiazide 25 MG tablet Commonly known as: HYDRODIURIL     TAKE these medications   apixaban 5 MG Tabs tablet Commonly known as: ELIQUIS Take 1 tablet (5 mg total) by mouth 2 (two) times daily.   atorvastatin 10 MG tablet Commonly known as: LIPITOR TAKE ONE TABLET BY MOUTH ONE TIME DAILY   B-12 PO Take 1 tablet by mouth daily.   celecoxib 200 MG capsule Commonly known as: CELEBREX TAKE 1 CAPSULE BY MOUTH 2 TIMES DAILY AS NEEDED What changed: See the new instructions.   FOLIC ACID PO Take 1 tablet by mouth daily.   furosemide 20 MG tablet Commonly known as: Lasix Take 1 tablet (20 mg total) by mouth daily as needed for edema (weight gain).   ibuprofen 200 MG tablet Commonly known as: ADVIL Take 400 mg by mouth every 6 (six) hours as  needed for headache, fever or mild pain.   magnesium oxide 400 (240 Mg) MG tablet Commonly known as: MAG-OX Take 1 tablet (400 mg total) by mouth 2 (two) times daily for 7 days.   metoprolol succinate 25 MG 24 hr tablet Commonly known as: TOPROL-XL Take 1 tablet (25 mg total) by mouth daily. Start taking on: August 27, 2020   sacubitril-valsartan 24-26 MG Commonly known as: ENTRESTO Take 1 tablet by  mouth 2 (two) times daily.   triamcinolone 55 MCG/ACT Aero nasal inhaler Commonly known as: NASACORT Place 2 sprays into the nose 2 (two) times daily as needed (allergies).   Vitamin D (Ergocalciferol) 1.25 MG (50000 UNIT) Caps capsule Commonly known as: DRISDOL Take 1 capsule (50,000 Units total) by mouth every 7 (seven) days for 12 doses.   vitamin E 1000 UNIT capsule Take 1,000 Units by mouth daily.       Follow-up Information    Isaac Bliss, Rayford Halsted, MD. Schedule an appointment as soon as possible for a visit in 1 week(s).   Specialty: Internal Medicine Why: With repeat BMP Contact information: [place] [address] [phone]        Minus Breeding, MD .   Specialty: Cardiology Contact information: [address] [place] [address] [phone]              Allergies  Allergen Reactions  . Codeine Nausea Only  . Codeine Sulfate     REACTION: unspecified  . Hydrocodone Nausea And Vomiting    Consultations:  Cardiology   Procedures/Studies: DG Chest 2 View  Result Date: 08/23/2020 CLINICAL DATA:  70 year old female with tachycardia. EXAM: CHEST - 2 VIEW COMPARISON:  Chest radiograph dated 05/15/2016. FINDINGS: There is cardiomegaly with vascular congestion. No focal consolidation, pleural effusion, or pneumothorax. Bibasilar atelectasis. Degenerative changes of the spine. No acute osseous pathology. IMPRESSION: Cardiomegaly with vascular congestion. No focal consolidation. Electronically Signed   By: Anner Crete M.D.   On: 08/23/2020 20:42   ECHOCARDIOGRAM COMPLETE  Result Date: 08/24/2020    ECHOCARDIOGRAM REPORT   Patient Name:   Shelly Bowers Date of Exam: 08/24/2020 Medical Rec #:  [id number]         Height:       66.0 in Accession #:    [id number]        Weight:       185.0 lb Date of Birth:  24-May-1950         BSA:          1.935 m Patient Age:    38 years          BP:           124/100 mmHg Patient  Gender: F                 HR:           130 bpm. Exam Location:  Inpatient Procedure: 2D Echo, Cardiac Doppler, Color Doppler and Intracardiac            Opacification Agent Indications:    I48.91* Unspeicified atrial fibrillation; I50.40* Unspecified                 combined systolic (congestive) and diastolic (congestive) heart                 failure  History:        Patient has prior history of Echocardiogram examinations, most  recent 08/07/2019. Abnormal ECG, Arrythmias:Atrial Fibrillation,                 Signs/Symptoms:Chest Pain; Risk Factors:Hypertension and                 Dyslipidemia.  Sonographer:    Roseanna Rainbow RDCS Referring Phys: [place]  Sonographer Comments: Technically challenging study due to limited acoustic windows, Technically difficult study due to poor echo windows, suboptimal parasternal window and suboptimal apical window. Suboptimal machine. IMPRESSIONS  1. Left ventricular ejection fraction, by estimation, is 30 to 35%. The left ventricle has moderately decreased function. The left ventricle demonstrates global hypokinesis. There is mild left ventricular hypertrophy. Left ventricular diastolic parameters are indeterminate.  2. Right ventricular systolic function is mildly reduced. The right ventricular size is mildly enlarged. Tricuspid regurgitation signal is inadequate for assessing PA pressure.  3. Left atrial size was mild to moderately dilated.  4. Right atrial size was mildly dilated.  5. The mitral valve is degenerative. Mild mitral valve regurgitation. No evidence of mitral stenosis.  6. The aortic valve is grossly normal. Aortic valve regurgitation is not visualized. No aortic stenosis is present.  7. The inferior vena cava is dilated in size with <50% respiratory variability, suggesting right atrial pressure of 15 mmHg. FINDINGS  Left Ventricle: Left ventricular ejection fraction, by estimation, is 30 to 35%. The left ventricle has moderately  decreased function. The left ventricle demonstrates global hypokinesis. Definity contrast agent was given IV to delineate the left ventricular endocardial borders. The left ventricular internal cavity size was normal in size. There is mild left ventricular hypertrophy. Left ventricular diastolic parameters are indeterminate. Right Ventricle: The right ventricular size is mildly enlarged. No increase in right ventricular wall thickness. Right ventricular systolic function is mildly reduced. Tricuspid regurgitation signal is inadequate for assessing PA pressure. Left Atrium: Left atrial size was mild to moderately dilated. Right Atrium: Right atrial size was mildly dilated. Pericardium: Trivial pericardial effusion is present. Presence of pericardial fat pad. Mitral Valve: The mitral valve is degenerative in appearance. Mild to moderate mitral annular calcification. Mild mitral valve regurgitation. No evidence of mitral valve stenosis. Tricuspid Valve: The tricuspid valve is normal in structure. Tricuspid valve regurgitation is trivial. No evidence of tricuspid stenosis. Aortic Valve: The aortic valve is grossly normal. Aortic valve regurgitation is not visualized. No aortic stenosis is present. Pulmonic Valve: The pulmonic valve was not well visualized. Pulmonic valve regurgitation is trivial. No evidence of pulmonic stenosis. Aorta: The aortic root is normal in size and structure. Venous: The inferior vena cava is dilated in size with less than 50% respiratory variability, suggesting right atrial pressure of 15 mmHg. IAS/Shunts: No atrial level shunt detected by color flow Doppler.  LEFT VENTRICLE PLAX 2D LVIDd:         4.35 cm LVIDs:         3.35 cm LV PW:         1.03 cm LV IVS:        1.01 cm LVOT diam:     1.80 cm LV SV:         32 LV SV Index:   16 LVOT Area:     2.54 cm  LV Volumes (MOD) LV vol d, MOD A2C: 71.7 ml LV vol d, MOD A4C: 74.6 ml LV vol s, MOD A2C: 47.4 ml LV vol s, MOD A4C: 47.7 ml LV SV MOD A2C:      24.3 ml LV SV MOD  A4C:     74.6 ml LV SV MOD BP:      26.4 ml RIGHT VENTRICLE            IVC RV S prime:     8.49 cm/s  IVC diam: 3.01 cm TAPSE (M-mode): 1.6 cm LEFT ATRIUM             Index       RIGHT ATRIUM           Index LA diam:        3.90 cm 2.02 cm/m  RA Area:     16.10 cm LA Vol (A2C):   60.3 ml 31.17 ml/m RA Volume:   39.50 ml  20.42 ml/m LA Vol (A4C):   97.9 ml 50.60 ml/m LA Biplane Vol: 78.3 ml 40.47 ml/m  AORTIC VALVE             PULMONIC VALVE LVOT Vmax:   81.30 cm/s  PR End Diast Vel: 1.62 msec LVOT Vmean:  61.200 cm/s LVOT VTI:    0.124 m  AORTA Ao Root diam: 3.00 cm Ao Asc diam:  3.40 cm MITRAL VALVE MV Area (PHT): 6.29 cm    SHUNTS MV Decel Time: 121 msec    Systemic VTI:  0.12 m MV E velocity: 91.43 cm/s  Systemic Diam: 1.80 cm Cherlynn Kaiser MD Electronically signed by Cherlynn Kaiser MD Signature Date/Time: 08/24/2020/2:39:50 PM    Final    ECHO TEE  Result Date: 08/25/2020    TRANSESOPHOGEAL ECHO REPORT   Patient Name:   Shelly Bowers Date of Exam: 08/25/2020 Medical Rec #:  [id number]         Height:       66.0 in Accession #:    [id number]        Weight:       185.0 lb Date of Birth:  09/14/1950         BSA:          1.935 m Patient Age:    70 years          BP:           143/94 mmHg Patient Gender: F                 HR:           73 bpm. Exam Location:  Inpatient Procedure: 3D Echo, Cardiac Doppler, Color Doppler, Transesophageal Echo and            Saline Contrast Bubble Study                                MODIFIED REPORT:   This report was modified by Rudean Haskell MD on 08/25/2020 due to LVEF                                     added.  Indications:     I48.0 Paroxysmal atrial fibrillation  History:         Patient has prior history of Echocardiogram examinations, most                  recent 08/24/2020. Abnormal ECG, Arrythmias:Atrial Fibrillation,                  Signs/Symptoms:Chest Pain; Risk Factors:Hypertension and  Dyslipidemia.  Sonographer:      Darlina Sicilian RDCS Referring Phys:  [id number] Tami Lin [facility] Diagnosing Phys: Rudean Haskell MD PROCEDURE: After discussion of the risks and benefits of a TEE, an informed consent was obtained from the patient. The transesophogeal probe was passed without difficulty through the esophogus of the patient. Imaged were obtained with the patient in a left lateral decubitus position. Sedation performed by different physician. The patient was monitored while under deep sedation. Anesthestetic sedation was provided intravenously by Anesthesiology: 4533.06mg  of Propofol, 40mg  of Lidocaine. Image quality was good. The patient's vital signs; including heart rate, blood pressure, and oxygen saturation; remained stable throughout the procedure. The patient developed no complications during the procedure. IMPRESSIONS  1. Left ventricular ejection fraction, by estimation, is 30 to 35%. Left ventricular ejection fraction by 3D volume is 30 %. The left ventricle has moderately decreased function. The left ventricle demonstrates global hypokinesis.  2. The mitral valve is degenerative. Mild mitral valve regurgitation with atrial functional regurgitation and two jets: A1-P1 and A3-P3.  3. Left atrial size was moderately dilated. No left atrial/left atrial appendage thrombus was detected.  4. Evidence of atrial level shunting detected by color flow Doppler. Agitated saline contrast bubble study was positive with shunting observed within 3-6 cardiac cycles suggestive of interatrial shunt. There is a small patent foramen ovale.  5. Right ventricular systolic function is mildly to moderately reduced. The right ventricular size is normal.  6. Right atrial size was mildly dilated.  7. The aortic valve is tricuspid. Aortic valve regurgitation is trivial.  8. There is mild (Grade II) plaque involving the descending aorta. FINDINGS  Left Ventricle: Left ventricular ejection fraction, by estimation, is 30 to 35%. Left ventricular  ejection fraction by 3D volume is 30 %. The left ventricle has moderately decreased function. The left ventricle demonstrates global hypokinesis. The left ventricular internal cavity size was normal in size. Right Ventricle: The right ventricular size is normal. No increase in right ventricular wall thickness. Right ventricular systolic function is mildly reduced. Left Atrium: Left atrial size was moderately dilated. No left atrial/left atrial appendage thrombus was detected. Right Atrium: Right atrial size was mildly dilated. Pericardium: Trivial pericardial effusion is present. Mitral Valve: MVA 7.19 cm2. The mitral valve is degenerative in appearance. Mild mitral valve regurgitation. Tricuspid Valve: The tricuspid valve is normal in structure. Tricuspid valve regurgitation is mild. Aortic Valve: The aortic valve is tricuspid. Aortic valve regurgitation is trivial. Pulmonic Valve: The pulmonic valve was grossly normal. Pulmonic valve regurgitation is not visualized. Aorta: The aortic root and ascending aorta are structurally normal, with no evidence of dilitation. There is mild (Grade II) plaque involving the descending aorta. Venous: The left upper pulmonary vein, left lower pulmonary vein, right upper pulmonary vein and right lower pulmonary vein are normal. IAS/Shunts: Evidence of atrial level shunting detected by color flow Doppler. Agitated saline contrast was given intravenously to evaluate for intracardiac shunting. Agitated saline contrast bubble study was positive with shunting observed within 3-6 cardiac cycles suggestive of interatrial shunt. A small patent foramen ovale is detected.  LEFT VENTRICLE PLAX 2D LVOT diam:     2.10 cm LV SV:         38 LV SV Index:   20              3D Volume EF LVOT Area:     3.46 cm        LV 3D EF:    Left  ventricular                                             ejection                                             fraction by                                              3D volume                                             is 30 %.                                LV 3D EDV:   58.03 ml                                LV 3D ESV:   40.31 ml                                 3D Volume EF:                                3D EF:        30 % RIGHT VENTRICLE RV S prime:     9.88 cm/s AORTIC VALVE LVOT Vmax:   63.80 cm/s LVOT Vmean:  41.700 cm/s LVOT VTI:    0.109 m  AORTA Ao Root diam: 2.70 cm Ao Asc diam:  3.00 cm  SHUNTS Systemic VTI:  0.11 m Systemic Diam: 2.10 cm Rudean Haskell MD Electronically signed by Rudean Haskell MD Signature Date/Time: 08/25/2020/7:21:46 PM    Final (Updated)        Subjective: Patient seen and examined at bedside.  She feels much better.  Wants to go home today.  Denies worsening shortness of breath, chest pain or palpitations.  No overnight fever reported.  Discharge Exam: Vitals:   08/25/20 2120 08/26/20 0552  BP: (!) 153/94 121/77  Pulse: 73 (!) 57  Resp: 18 18  Temp: 98.2 F (36.8 C) 98.3 F (36.8 C)  SpO2: 92% 95%    General: Pt is alert, awake, not in acute distress Cardiovascular: Intermittently bradycardic, S1/S2 + Respiratory: bilateral decreased breath sounds at bases with some basilar crackles Abdominal: Soft, NT, ND, bowel sounds + Extremities: Trace lower extremity edema; no cyanosis    The results of significant diagnostics from this hospitalization (including imaging, microbiology, ancillary and laboratory) are listed below for reference.     Microbiology: Recent Results (from the past 240 hour(s))  Resp Panel by RT-PCR (Flu A&B, Covid) Nasopharyngeal Swab     Status: None   Collection Time: 08/23/20  6:54 PM   Specimen: Nasopharyngeal Swab; Nasopharyngeal(NP) swabs in vial transport  medium  Result Value Ref Range Status   SARS Coronavirus 2 by RT PCR NEGATIVE NEGATIVE Final    Comment: (NOTE) SARS-CoV-2 target nucleic acids are NOT DETECTED.  The SARS-CoV-2  RNA is generally detectable in upper respiratory specimens during the acute phase of infection. The lowest concentration of SARS-CoV-2 viral copies this assay can detect is 138 copies/mL. A negative result does not preclude SARS-Cov-2 infection and should not be used as the sole basis for treatment or other patient management decisions. A negative result may occur with  improper specimen collection/handling, submission of specimen other than nasopharyngeal swab, presence of viral mutation(s) within the areas targeted by this assay, and inadequate number of viral copies(<138 copies/mL). A negative result must be combined with clinical observations, patient history, and epidemiological information. The expected result is Negative.  Fact Sheet for Patients:  EntrepreneurPulse.com.au  Fact Sheet for Healthcare Providers:  IncredibleEmployment.be  This test is no t yet approved or cleared by the [place] FDA and  has been authorized for detection and/or diagnosis of SARS-CoV-2 by FDA under an Emergency Use Authorization (EUA). This EUA will remain  in effect (meaning this test can be used) for the duration of the COVID-19 declaration under Section 564(b)(1) of the Act, 21 U.S.C.section 360bbb-3(b)(1), unless the authorization is terminated  or revoked sooner.       Influenza A by PCR NEGATIVE NEGATIVE Final   Influenza B by PCR NEGATIVE NEGATIVE Final    Comment: (NOTE) The Xpert Xpress SARS-CoV-2/FLU/RSV plus assay is intended as an aid in the diagnosis of influenza from Nasopharyngeal swab specimens and should not be used as a sole basis for treatment. Nasal washings and aspirates are unacceptable for Xpert Xpress SARS-CoV-2/FLU/RSV testing.  Fact Sheet for Patients: EntrepreneurPulse.com.au  Fact Sheet for Healthcare Providers: IncredibleEmployment.be  This test is not yet approved or cleared by the  [place] FDA and has been authorized for detection and/or diagnosis of SARS-CoV-2 by FDA under an Emergency Use Authorization (EUA). This EUA will remain in effect (meaning this test can be used) for the duration of the COVID-19 declaration under Section 564(b)(1) of the Act, 21 U.S.C. section 360bbb-3(b)(1), unless the authorization is terminated or revoked.  Performed at [facility], [place] [address] [place], [place] [address]      Labs: BNP (last 3 results) Recent Labs    08/23/20 2020  BNP 426.8*   Basic Metabolic Panel: Recent Labs  Lab 08/23/20 1854 08/23/20 2020 08/24/20 0428 08/26/20 0338  NA 137  --  136 129*  K 3.5  --  3.3* 3.5  CL 100  --  99 96*  CO2 27  --  25 25  GLUCOSE 111*  --  110* 108*  BUN 17  --  16 15  CREATININE 0.75  --  0.82 0.82  CALCIUM 9.5  --  9.1 8.8*  MG  --  2.0  --  1.6*   Liver Function Tests: Recent Labs  Lab 08/23/20 1854  AST 37  ALT 42  ALKPHOS 84  BILITOT 1.2  PROT 6.9  ALBUMIN 4.5   No results for input(s): LIPASE, AMYLASE in the last 168 hours. No results for input(s): AMMONIA in the last 168 hours. CBC: Recent Labs  Lab 08/23/20 1854 08/24/20 0428  WBC 6.6 6.5  NEUTROABS 4.1  --   HGB 12.7 11.9*  HCT 39.2 36.2  MCV 106.5* 105.5*  PLT 197 180   Cardiac Enzymes: No results for input(s): CKTOTAL, CKMB,  CKMBINDEX, TROPONINI in the last 168 hours. BNP: Invalid input(s): POCBNP CBG: No results for input(s): GLUCAP in the last 168 hours. D-Dimer Recent Labs    08/24/20 0428  DDIMER 1.73*   Hgb A1c No results for input(s): HGBA1C in the last 72 hours. Lipid Profile No results for input(s): CHOL, HDL, LDLCALC, TRIG, CHOLHDL, LDLDIRECT in the last 72 hours. Thyroid function studies Recent Labs    08/23/20 2142  TSH 2.304   Anemia work up No results for input(s): VITAMINB12, FOLATE, FERRITIN, TIBC, IRON, RETICCTPCT in the last 72 hours. Urinalysis    Component Value  Date/Time   BILIRUBINUR n 06/10/2015 0949   KETONESUR negative 04/29/2014 1012   PROTEINUR n 06/10/2015 0949   UROBILINOGEN 0.2 06/10/2015 0949   NITRITE n 06/10/2015 0949   LEUKOCYTESUR Negative 06/10/2015 0949   Sepsis Labs Invalid input(s): PROCALCITONIN,  WBC,  LACTICIDVEN Microbiology Recent Results (from the past 240 hour(s))  Resp Panel by RT-PCR (Flu A&B, Covid) Nasopharyngeal Swab     Status: None   Collection Time: 08/23/20  6:54 PM   Specimen: Nasopharyngeal Swab; Nasopharyngeal(NP) swabs in vial transport medium  Result Value Ref Range Status   SARS Coronavirus 2 by RT PCR NEGATIVE NEGATIVE Final    Comment: (NOTE) SARS-CoV-2 target nucleic acids are NOT DETECTED.  The SARS-CoV-2 RNA is generally detectable in upper respiratory specimens during the acute phase of infection. The lowest concentration of SARS-CoV-2 viral copies this assay can detect is 138 copies/mL. A negative result does not preclude SARS-Cov-2 infection and should not be used as the sole basis for treatment or other patient management decisions. A negative result may occur with  improper specimen collection/handling, submission of specimen other than nasopharyngeal swab, presence of viral mutation(s) within the areas targeted by this assay, and inadequate number of viral copies(<138 copies/mL). A negative result must be combined with clinical observations, patient history, and epidemiological information. The expected result is Negative.  Fact Sheet for Patients:  EntrepreneurPulse.com.au  Fact Sheet for Healthcare Providers:  IncredibleEmployment.be  This test is no t yet approved or cleared by the [place] FDA and  has been authorized for detection and/or diagnosis of SARS-CoV-2 by FDA under an Emergency Use Authorization (EUA). This EUA will remain  in effect (meaning this test can be used) for the duration of the COVID-19 declaration under Section  564(b)(1) of the Act, 21 U.S.C.section 360bbb-3(b)(1), unless the authorization is terminated  or revoked sooner.       Influenza A by PCR NEGATIVE NEGATIVE Final   Influenza B by PCR NEGATIVE NEGATIVE Final    Comment: (NOTE) The Xpert Xpress SARS-CoV-2/FLU/RSV plus assay is intended as an aid in the diagnosis of influenza from Nasopharyngeal swab specimens and should not be used as a sole basis for treatment. Nasal washings and aspirates are unacceptable for Xpert Xpress SARS-CoV-2/FLU/RSV testing.  Fact Sheet for Patients: EntrepreneurPulse.com.au  Fact Sheet for Healthcare Providers: IncredibleEmployment.be  This test is not yet approved or cleared by the [place] FDA and has been authorized for detection and/or diagnosis of SARS-CoV-2 by FDA under an Emergency Use Authorization (EUA). This EUA will remain in effect (meaning this test can be used) for the duration of the COVID-19 declaration under Section 564(b)(1) of the Act, 21 U.S.C. section 360bbb-3(b)(1), unless the authorization is terminated or revoked.  Performed at [facility], [place] [address] [address] [place], [place] [address]      Time coordinating discharge: 35 minutes  SIGNED:   Winnell Bento  Starla Link, MD  [facility] 08/26/2020, 10:36 AM

## 2020-08-26 NOTE — Progress Notes (Signed)
AVS reviewed with patient. All questions asked. Pt express gratitude to nurse for care provided.    IV removed and intact.  Belongings packed by Nurse tech Lanelle Bal and given to patient.   Patient states she is waiting for her husband to pick her up.

## 2020-08-29 ENCOUNTER — Other Ambulatory Visit: Payer: Self-pay | Admitting: Student in an Organized Health Care Education/Training Program

## 2020-08-29 ENCOUNTER — Telehealth: Payer: Self-pay | Admitting: Student in an Organized Health Care Education/Training Program

## 2020-08-29 ENCOUNTER — Telehealth: Payer: Self-pay

## 2020-08-29 DIAGNOSIS — I480 Paroxysmal atrial fibrillation: Secondary | ICD-10-CM

## 2020-08-29 MED ORDER — METOPROLOL SUCCINATE ER 25 MG PO TB24
25.0000 mg | ORAL_TABLET | Freq: Two times a day (BID) | ORAL | 11 refills | Status: DC
Start: 2020-08-29 — End: 2020-12-30

## 2020-08-29 NOTE — Progress Notes (Signed)
Change toprol XL 25 mg PO daily to bid dosing tonight.

## 2020-08-29 NOTE — Telephone Encounter (Signed)
Paged by operator that patient's HR is 146.  Recent discharge on 08/26/20 for AF/RVR and underwent TEE/DCCV (08/25/20). She was started on eliquis/entresto and metoprolol. Dr. Percival Spanish saw her on 08/26/20 after successful DCCV. EF 30-35% with global HK on TTE on 08/24/20 with moderate LA dilation.   Patient said she was watching TV and her apple watch alarmed that her HR was 157 for the prior 10 minutes. Currently at HR of 95. Lowest it was today was 56, high of 157. SOB is improved since discharge and although still mildly present she would not have been aware of her arrhythmia had it not been for her apple watch. Currently on eliquis 5 mg PO bid without missed doses. She takes toprol XL 25 mg PO daily which she takes in the mornings. Tolerated metop tartrate 25 mg PO tid in the hospital. Returns to Dr. Percival Spanish on 06/27, sees PCP this Thursday. HR 120-150 for about 10 minutes before her evening medications. Denies any lightheadedness, dizziness, low BP etc on meds and feels better on them.   We discussed management of her atrial fibrillation and the effects of metoprolol on controlling her rate but not keeping her in normal sinus rhythm.  We discussed other options for management including rhythm control medications and potential catheter ablation in the future.  She has follow-up in 3 weeks with Dr. Percival Spanish.  I think for now it is reasonable to increase her metoprolol to Toprol XL 25 mg p.o. twice daily from daily.  Her last dose was this morning, she will take an additional dose tonight and start twice daily dosing now.  [facility]  I asked her to call back if her rates are sustained over 150 for several hours or she develops any symptoms such as shortness of breath, PND, chest pain or discomfort as we would need to expedite management of her A. fib at that point.  She is comfortable with the plan and will pick up her prescriptions tomorrow.  She has plenty of  metoprolol to increase to twice daily dosing until she receives her new prescription.

## 2020-08-29 NOTE — Telephone Encounter (Cosign Needed)
Transition Care Management Unsuccessful Follow-up Telephone Call  Date of discharge and from where:  08/26/20 From [facility]  Attempts:  1st Attempt  Reason for unsuccessful TCM follow-up call:  Left voice message

## 2020-08-31 ENCOUNTER — Other Ambulatory Visit: Payer: Self-pay

## 2020-09-01 ENCOUNTER — Ambulatory Visit (INDEPENDENT_AMBULATORY_CARE_PROVIDER_SITE_OTHER): Payer: Medicare HMO | Admitting: Internal Medicine

## 2020-09-01 ENCOUNTER — Encounter: Payer: Self-pay | Admitting: Internal Medicine

## 2020-09-01 VITALS — BP 110/98 | HR 45 | Temp 98.1°F | Wt 178.5 lb

## 2020-09-01 DIAGNOSIS — I480 Paroxysmal atrial fibrillation: Secondary | ICD-10-CM | POA: Diagnosis not present

## 2020-09-01 DIAGNOSIS — Z09 Encounter for follow-up examination after completed treatment for conditions other than malignant neoplasm: Secondary | ICD-10-CM

## 2020-09-01 DIAGNOSIS — E785 Hyperlipidemia, unspecified: Secondary | ICD-10-CM | POA: Diagnosis not present

## 2020-09-01 DIAGNOSIS — G479 Sleep disorder, unspecified: Secondary | ICD-10-CM

## 2020-09-01 DIAGNOSIS — I5032 Chronic diastolic (congestive) heart failure: Secondary | ICD-10-CM

## 2020-09-01 DIAGNOSIS — E559 Vitamin D deficiency, unspecified: Secondary | ICD-10-CM

## 2020-09-01 LAB — CBC WITH DIFFERENTIAL/PLATELET
Basophils Absolute: 0 10*3/uL (ref 0.0–0.1)
Basophils Relative: 0.6 % (ref 0.0–3.0)
Eosinophils Absolute: 0.1 10*3/uL (ref 0.0–0.7)
Eosinophils Relative: 1.5 % (ref 0.0–5.0)
HCT: 39.8 % (ref 36.0–46.0)
Hemoglobin: 13.1 g/dL (ref 12.0–15.0)
Lymphocytes Relative: 21.6 % (ref 12.0–46.0)
Lymphs Abs: 1.3 10*3/uL (ref 0.7–4.0)
MCHC: 33 g/dL (ref 30.0–36.0)
MCV: 102.6 fl — ABNORMAL HIGH (ref 78.0–100.0)
Monocytes Absolute: 0.4 10*3/uL (ref 0.1–1.0)
Monocytes Relative: 6.4 % (ref 3.0–12.0)
Neutro Abs: 4.1 10*3/uL (ref 1.4–7.7)
Neutrophils Relative %: 69.9 % (ref 43.0–77.0)
Platelets: 235 10*3/uL (ref 150.0–400.0)
RBC: 3.88 Mil/uL (ref 3.87–5.11)
RDW: 13.9 % (ref 11.5–15.5)
WBC: 5.9 10*3/uL (ref 4.0–10.5)

## 2020-09-01 LAB — COMPREHENSIVE METABOLIC PANEL
ALT: 17 U/L (ref 0–35)
AST: 17 U/L (ref 0–37)
Albumin: 4.2 g/dL (ref 3.5–5.2)
Alkaline Phosphatase: 64 U/L (ref 39–117)
BUN: 16 mg/dL (ref 6–23)
CO2: 27 mEq/L (ref 19–32)
Calcium: 9.3 mg/dL (ref 8.4–10.5)
Chloride: 100 mEq/L (ref 96–112)
Creatinine, Ser: 1.01 mg/dL (ref 0.40–1.20)
GFR: 55.65 mL/min — ABNORMAL LOW (ref >60.00)
Glucose, Bld: 124 mg/dL — ABNORMAL HIGH (ref 70–99)
Potassium: 3.9 mEq/L (ref 3.5–5.1)
Sodium: 137 mEq/L (ref 135–145)
Total Bilirubin: 1.2 mg/dL (ref 0.2–1.2)
Total Protein: 6.5 g/dL (ref 6.0–8.3)

## 2020-09-01 LAB — MAGNESIUM: Magnesium: 2 mg/dL (ref 1.5–2.5)

## 2020-09-01 LAB — VITAMIN D 25 HYDROXY (VIT D DEFICIENCY, FRACTURES): VITD: 38.13 ng/mL (ref 30.00–100.00)

## 2020-09-01 NOTE — Addendum Note (Signed)
Addended by: Amanda Cockayne on: 09/01/2020 01:24 PM   Modules accepted: Orders

## 2020-09-01 NOTE — Progress Notes (Signed)
Established Patient Office Visit     This visit occurred during the SARS-CoV-2 public health emergency.  Safety protocols were in place, including screening questions prior to the visit, additional usage of staff PPE, and extensive cleaning of exam room while observing appropriate contact time as indicated for disinfecting solutions.    CC/Reason for Visit: Hospital discharge follow-up  HPI: Shelly Bowers is a 70 y.o. female who is coming in today for the above mentioned reasons. Past Medical History is significant for: Hypertension and hyperlipidemia.  On memorial day weekend she developed shortness of breath and cough.  She thought that she had COVID so that Tuesday [facility]  While there she was noted to be short of breath with a heart rate of over 170 and was sent to the hospital for evaluation.  She was found to be in new onset atrial fibrillation with rapid ventricular response.  She was admitted on 5/31 and discharged on 6/3.  She had a TEE/DCCV on 6/3.  She was found to have new heart failure with reduced ejection fraction of 30 to 35% by echocardiogram.  She was started on metoprolol 25 mg daily, Entresto and anticoagulated on Eliquis.  After hospital discharge her apple watch was alarming rapid rates in the 140s and so she called the on-call cardiologist who recommended increasing her metoprolol to 25 mg twice daily.  She has been doing relatively well since her discharge.  She is no longer short of breath.  She has not felt any palpitations, dizziness or shortness of breath.  She tells me that for a few months she has noticed difficulty sleeping, she wakes up frequently throughout the night and feels daytime fatigue as well.   Past Medical/Surgical History: Past Medical History:  Diagnosis Date   HTN (hypertension)    Hyperlipidemia    Osteopenia     Past Surgical History:  Procedure Laterality Date   AUGMENTATION MAMMAPLASTY Bilateral     BREAST EXCISIONAL BIOPSY Left    BUBBLE STUDY  08/25/2020   Procedure: BUBBLE STUDY;  Surgeon: Werner Lean, MD;  Location: [facility];  Service: Cardiovascular;;   CARDIOVERSION N/A 08/25/2020   Procedure: CARDIOVERSION;  Surgeon: Werner Lean, MD;  Location: [facility];  Service: Cardiovascular;  Laterality: N/A;   INCISION AND DRAINAGE ABSCESS Left 12/27/2015   Procedure: INCISION AND DRAINAGE ABSCESS;  Surgeon: Daryll Brod, MD;  Location: [facility];  Service: Orthopedics;  Laterality: Left;   TEE WITHOUT CARDIOVERSION N/A 08/25/2020   Procedure: TRANSESOPHAGEAL ECHOCARDIOGRAM (TEE);  Surgeon: Werner Lean, MD;  [facility];  Service: Cardiovascular;  Laterality: N/A;    Social History:  reports that she has never smoked. She has never used smokeless tobacco. She reports current alcohol use. She reports that she does not use drugs.  Allergies: Allergies  Allergen Reactions   Codeine Nausea Only   Codeine Sulfate     REACTION: unspecified   Hydrocodone Nausea And Vomiting    Family History:  Family History  Problem Relation Age of Onset   Breast cancer Mother 53     Current Outpatient Medications:    apixaban (ELIQUIS) 5 MG TABS tablet, Take 1 tablet (5 mg total) by mouth 2 (two) times daily., Disp: 60 tablet, Rfl: 0   atorvastatin (LIPITOR) 10 MG tablet, TAKE ONE TABLET BY MOUTH ONE TIME DAILY (Patient taking differently: Take 10 mg by mouth daily.), Disp: 90 tablet, Rfl: 1  celecoxib (CELEBREX) 200 MG capsule, TAKE 1 CAPSULE BY MOUTH 2 TIMES DAILY AS NEEDED (Patient taking differently: Take 200 mg by mouth daily as needed for mild pain.), Disp: 90 capsule, Rfl: 1   Cyanocobalamin (B-12 PO), Take 1 tablet by mouth daily., Disp: , Rfl:    FOLIC ACID PO, Take 1 tablet by mouth daily., Disp: , Rfl:    furosemide (LASIX) 20 MG tablet, Take 1 tablet (20 mg total) by mouth daily as needed for edema (weight gain)., Disp: 30  tablet, Rfl: 0   ibuprofen (ADVIL,MOTRIN) 200 MG tablet, Take 400 mg by mouth every 6 (six) hours as needed for headache, fever or mild pain., Disp: , Rfl:    magnesium oxide (MAG-OX) 400 (240 Mg) MG tablet, Take 1 tablet (400 mg total) by mouth 2 (two) times daily for 7 days., Disp: 14 tablet, Rfl: 0   metoprolol succinate (TOPROL XL) 25 MG 24 hr tablet, Take 1 tablet (25 mg total) by mouth 2 (two) times daily., Disp: 60 tablet, Rfl: 11   OVER THE COUNTER MEDICATION, Aller Tec, Disp: , Rfl:    sacubitril-valsartan (ENTRESTO) 24-26 MG, Take 1 tablet by mouth 2 (two) times daily., Disp: 60 tablet, Rfl: 0   triamcinolone (NASACORT) 55 MCG/ACT AERO nasal inhaler, Place 2 sprays into the nose 2 (two) times daily as needed (allergies)., Disp: , Rfl:    Vitamin D, Ergocalciferol, (DRISDOL) 1.25 MG (50000 UNIT) CAPS capsule, Take 1 capsule (50,000 Units total) by mouth every 7 (seven) days for 12 doses., Disp: 12 capsule, Rfl: 0   vitamin E 1000 UNIT capsule, Take 1,000 Units by mouth daily., Disp: , Rfl:   Review of Systems:  Constitutional: Denies fever, chills, diaphoresis, appetite change and fatigue.  HEENT: Denies photophobia, eye pain, redness, hearing loss, ear pain, congestion, sore throat, rhinorrhea, sneezing, mouth sores, trouble swallowing, neck pain, neck stiffness and tinnitus.   Respiratory: Denies SOB, DOE, cough, chest tightness,  and wheezing.   Cardiovascular: Denies chest pain, palpitations and leg swelling.  Gastrointestinal: Denies nausea, vomiting, abdominal pain, diarrhea, constipation, blood in stool and abdominal distention.  Genitourinary: Denies dysuria, urgency, frequency, hematuria, flank pain and difficulty urinating.  Endocrine: Denies: hot or cold intolerance, sweats, changes in hair or nails, polyuria, polydipsia. Musculoskeletal: Denies myalgias, back pain, joint swelling, arthralgias and gait problem.  Skin: Denies pallor, rash and wound.  Neurological: Denies  dizziness, seizures, syncope, weakness, light-headedness, numbness and headaches.  Hematological: Denies adenopathy. Easy bruising, personal or family bleeding history  Psychiatric/Behavioral: Denies suicidal ideation, mood changes, confusion, nervousness and agitation    Physical Exam: Vitals:   09/01/20 1140  BP: (!) 110/98  Pulse: (!) 45  Temp: 98.1 F (36.7 C)  TempSrc: Oral  SpO2: 96%  Weight: 178 lb 8 oz (81 kg)    Body mass index is 28.81 kg/m.   Constitutional: NAD, calm, comfortable Eyes: PERRL, lids and conjunctivae normal, wears corrective lenses ENMT: Mucous membranes are moist.  Respiratory: clear to auscultation bilaterally, no wheezing, no crackles. Normal respiratory effort. No accessory muscle use.  Cardiovascular: Regular rate, irregular rhythm, no murmurs / rubs / gallops. No extremity edema.  Neurologic: Grossly intact and nonfocal Psychiatric: Normal judgment and insight. Alert and oriented x 3. Normal mood.    Impression and Plan:  Hospital discharge follow-up -Labs ordered as requested by discharging hospitalist.  Paroxysmal atrial fibrillation ([place]) -New diagnosis, anticoagulated on Eliquis, rate controlled on metoprolol.  She is bradycardic today at 43 but tells me that  her heart rates average in the 1 10-1 30s at home. -She will follow-up with cardiology soon  Chronic heart failure with preserved ejection fraction ([place]) -New diagnosis, echocardiogram with EF of 30 to 35%. -Is currently compensated, has follow-up with cardiology scheduled.  Hyperlipidemia, unspecified hyperlipidemia type -Continue statin, recheck lipids next visit  Disordered sleep  - Plan: Ambulatory referral to Neurology for sleep study, suspicious for sleep apnea.  Time spent: 34 minutes reviewing chart, interviewing and examining patient and formulating plan of care.     Lelon Frohlich, MD [facility]

## 2020-09-08 ENCOUNTER — Inpatient Hospital Stay: Payer: Medicare HMO | Admitting: Internal Medicine

## 2020-09-12 ENCOUNTER — Telehealth: Payer: Self-pay | Admitting: Cardiology

## 2020-09-12 NOTE — Telephone Encounter (Signed)
Patient is following up. She is extremely concerned she will not have any medication to take tonight. She would like a call back to confirm when the Rx is being sent over.

## 2020-09-12 NOTE — Telephone Encounter (Signed)
*  STAT* If patient is at the pharmacy, call can be transferred to refill team.   1. Which medications need to be refilled? (please list name of each medication and dose if known)  metoprolol succinate (TOPROL XL) 25 MG 24 hr tablet   2. Which pharmacy/location (including street and city if local pharmacy) is medication to be sent to? [id number] [place] [place] - [address]  3. Do they need a 30 day or 90 day supply? 30 with refills   Patient states her PCP increased the dosage from one pill daily to two pills daily.  This change has caused the patient to run out of medication sooner. Her PCP was going to send a new rx to the pharmacy, but the pharmacy does not have one on file.  Please send new rx to pharmacy for 2 tablets daily. Patient is out of medication

## 2020-09-15 ENCOUNTER — Ambulatory Visit
Admission: RE | Admit: 2020-09-15 | Discharge: 2020-09-15 | Disposition: A | Discharge status: Home / Self Care | Payer: Medicare HMO | Source: Ambulatory Visit | Attending: Internal Medicine | Admitting: Internal Medicine

## 2020-09-15 ENCOUNTER — Other Ambulatory Visit: Payer: Self-pay

## 2020-09-15 DIAGNOSIS — Z13820 Encounter for screening for osteoporosis: Secondary | ICD-10-CM

## 2020-09-15 DIAGNOSIS — M8589 Other specified disorders of bone density and structure, multiple sites: Secondary | ICD-10-CM | POA: Diagnosis not present

## 2020-09-15 DIAGNOSIS — Z780 Asymptomatic menopausal state: Secondary | ICD-10-CM | POA: Diagnosis not present

## 2020-09-18 DIAGNOSIS — I5021 Acute systolic (congestive) heart failure: Secondary | ICD-10-CM | POA: Insufficient documentation

## 2020-09-18 DIAGNOSIS — E876 Hypokalemia: Secondary | ICD-10-CM | POA: Insufficient documentation

## 2020-09-18 NOTE — Progress Notes (Signed)
Cardiology Office Note   Date:  09/20/2020   ID:  Shelly Bowers, DOB January 11, 1951, MRN [id number]  PCP:  Isaac Bliss, Rayford Halsted, MD  Cardiologist:   Minus Breeding, MD   Chief Complaint  Patient presents with   Atrial Fibrillation       History of Present Illness: Shelly Bowers is a 70 y.o. female who presents for follow up of atrial fib.  She was in the hospital for this recently and required TEE/DCCV.  She had an EF of 30 - 35%.  She thinks that she was probably in sinus rhythm for couple of days before she went back into fibrillation.  She has been noticing her heart rate spiking on her device.  In retrospect she might feel a little when she lies on her left side but she really does not notice that she is particular out of rhythm.  She is not having any shortness of breath, PND or orthopnea.  She is not having any palpitations, presyncope or syncope.  She does drink a couple glasses of wine each night and wonders if that could have been a trigger.  Of note in the hospital she did have bradycardia post cardioversion and her QT interval was prolonged on that EKG.  Her heart rate in fibrillation at home has been frequently above 110.  She did have her dose of beta-blocker increased slightly.  She has not had any problems with her blood thinner.  She had no new shortness of breath, PND or orthopnea.  She has had no increased lower extremity swelling.    Past Medical History:  Diagnosis Date   Atrial fibrillation ([place])    Cardiomyopathy ([place])    HTN (hypertension)    Hyperlipidemia    Osteopenia     Past Surgical History:  Procedure Laterality Date   AUGMENTATION MAMMAPLASTY Bilateral    BREAST EXCISIONAL BIOPSY Left    BUBBLE STUDY  08/25/2020   Procedure: BUBBLE STUDY;  Surgeon: Werner Lean, MD;  Location: [facility];  Service: Cardiovascular;;   CARDIOVERSION N/A 08/25/2020   Procedure: CARDIOVERSION;  Surgeon: Werner Lean, MD;  Location:  [facility];  Service: Cardiovascular;  Laterality: N/A;   INCISION AND DRAINAGE ABSCESS Left 12/27/2015   Procedure: INCISION AND DRAINAGE ABSCESS;  Surgeon: Daryll Brod, MD;  Location: [facility];  Service: Orthopedics;  Laterality: Left;   TEE WITHOUT CARDIOVERSION N/A 08/25/2020   Procedure: TRANSESOPHAGEAL ECHOCARDIOGRAM (TEE);  Surgeon: Werner Lean, MD;  Location: [facility] ENDOSCOPY;  Service: Cardiovascular;  Laterality: N/A;     Current Outpatient Medications  Medication Sig Dispense Refill   atorvastatin (LIPITOR) 10 MG tablet TAKE ONE TABLET BY MOUTH ONE TIME DAILY 90 tablet 1   celecoxib (CELEBREX) 200 MG capsule TAKE 1 CAPSULE BY MOUTH 2 TIMES DAILY AS NEEDED 90 capsule 1   Cyanocobalamin (B-12 PO) Take 1 tablet by mouth daily.     digoxin (LANOXIN) 0.125 MG tablet Take 1 tablet (0.125 mg total) by mouth daily. 90 tablet 3   FOLIC ACID PO Take 1 tablet by mouth daily.     ibuprofen (ADVIL,MOTRIN) 200 MG tablet Take 400 mg by mouth every 6 (six) hours as needed for headache, fever or mild pain.     metoprolol succinate (TOPROL XL) 25 MG 24 hr tablet Take 1 tablet (25 mg total) by mouth 2 (two) times daily. 60 tablet 11   OVER THE COUNTER MEDICATION Aller Tec     triamcinolone (NASACORT)  55 MCG/ACT AERO nasal inhaler Place 2 sprays into the nose 2 (two) times daily as needed (allergies).     Vitamin D, Ergocalciferol, (DRISDOL) 1.25 MG (50000 UNIT) CAPS capsule Take 1 capsule (50,000 Units total) by mouth every 7 (seven) days for 12 doses. 12 capsule 0   vitamin E 1000 UNIT capsule Take 1,000 Units by mouth daily.     apixaban (ELIQUIS) 5 MG TABS tablet Take 1 tablet (5 mg total) by mouth 2 (two) times daily. 180 tablet 3   furosemide (LASIX) 20 MG tablet Take 1 tablet (20 mg total) by mouth daily as needed for edema (weight gain). 30 tablet 0   sacubitril-valsartan (ENTRESTO) 24-26 MG Take 1 tablet by mouth 2 (two) times daily. 180 tablet 3   No current  facility-administered medications for this visit.    Allergies:   Codeine, Codeine sulfate, and Hydrocodone    ROS:  Please see the history of present illness.   Otherwise, review of systems are positive for none.   All other systems are reviewed and negative.    PHYSICAL EXAM: VS:  BP 118/80   Pulse (!) 119   Ht 5' 4.5" (1.638 m)   Wt 177 lb 3.2 oz (80.4 kg)   SpO2 97%   BMI 29.95 kg/m  , BMI Body mass index is 29.95 kg/m. GENERAL:  Well appearing NECK:  No jugular venous distention, waveform within normal limits, carotid upstroke brisk and symmetric, no bruits, no thyromegaly LYMPHATICS:  No cervical, inguinal adenopathy LUNGS:  Clear to auscultation bilaterally CHEST:  Unremarkable HEART:  PMI not displaced or sustained,S1 and S2 within normal limits, no S3,  no clicks, no rubs, no murmurs, irregular ABD:  Flat, positive bowel sounds normal in frequency in pitch, no bruits, no rebound, no guarding, no midline pulsatile mass, no hepatomegaly, no splenomegaly EXT:  2 plus pulses throughout, trace edema, no cyanosis no clubbing   EKG:  EKG is ordered today. The ekg ordered today demonstrates atrial fibrillation with rapid ventricular rate, 119, leftward axis, low voltage in the limb leads, no acute ST-T wave changes.   Recent Labs: 08/23/2020: B Natriuretic Peptide 370.5; TSH 2.304 09/01/2020: ALT 17; BUN 16; Creatinine, Ser 1.01; Hemoglobin 13.1; Magnesium 2.0; Platelets 235.0; Potassium 3.9; Sodium 137    Lipid Panel    Component Value Date/Time   CHOL 219 (H) 01/22/2020 1115   TRIG 115 01/22/2020 1115   HDL 71 01/22/2020 1115   CHOLHDL 3.1 01/22/2020 1115   VLDL 38.4 08/21/2017 0940   LDLCALC 125 (H) 01/22/2020 1115   LDLDIRECT 191.9 04/14/2013 0856      Wt Readings from Last 3 Encounters:  09/19/20 177 lb 3.2 oz (80.4 kg)  09/01/20 178 lb 8 oz (81 kg)  08/26/20 188 lb 11.4 oz (85.6 kg)      Other studies Reviewed: Additional studies/ records that were  reviewed today include: Labs, hospital records. Review of the above records demonstrates:  Please see elsewhere in the note.     ASSESSMENT AND PLAN:  New onset paroxysmal A. fib with RVR:    She is back in atrial fibrillation.  I think her choices for therapy are limited.  She could be on amiodarone.  She had prolonged QT so I would not be inclined to use Tikosyn.  She has a reduced ejection fraction.  I think we should consider ablation possibly first-line and I Minna set her up to see one of our electrophysiologist.  We talked about this at  length.  Today I am going to add digoxin 0.125 mg daily and I might be able to titrate her beta-blocker further pending her heart rate.  Acute systolic CHF: Hopefully this is rate related.  The plan was to continue the meds as listed.  Her blood pressure will not allow med titration.  Once we have controlled her rhythm I would reassess her ejection fraction.  Of note I do not strongly suspect obstructive coronary disease.  Troponin was negative in the hospital and she is had no pain.  She can go to as needed on her Lasix.  Another potassium and magnesium were normal when most recently checked.  Hypertension:  This is being managed in the context of treating his CHF  hypertensive plan as above.   Hyperlipidemia: Continue statin  Current medicines are reviewed at length with the patient today.  The patient does not have concerns regarding medicines.  The following changes have been made:  as aboe  Labs/ tests ordered today include: None  Orders Placed This Encounter  Procedures   Ambulatory referral to Cardiac Electrophysiology   EKG 12-Lead      Disposition:   FU with me after the EP evaluation.      Signed, Minus Breeding, MD  09/20/2020 11:25 AM    [facility]

## 2020-09-19 ENCOUNTER — Encounter: Payer: Self-pay | Admitting: Cardiology

## 2020-09-19 ENCOUNTER — Ambulatory Visit: Payer: Medicare HMO | Admitting: Cardiology

## 2020-09-19 ENCOUNTER — Other Ambulatory Visit: Payer: Self-pay

## 2020-09-19 VITALS — BP 118/80 | HR 119 | Ht 64.5 in | Wt 177.2 lb

## 2020-09-19 DIAGNOSIS — I5021 Acute systolic (congestive) heart failure: Secondary | ICD-10-CM

## 2020-09-19 DIAGNOSIS — E876 Hypokalemia: Secondary | ICD-10-CM | POA: Diagnosis not present

## 2020-09-19 DIAGNOSIS — I4891 Unspecified atrial fibrillation: Secondary | ICD-10-CM | POA: Diagnosis not present

## 2020-09-19 DIAGNOSIS — I10 Essential (primary) hypertension: Secondary | ICD-10-CM | POA: Diagnosis not present

## 2020-09-19 DIAGNOSIS — E785 Hyperlipidemia, unspecified: Secondary | ICD-10-CM | POA: Diagnosis not present

## 2020-09-19 MED ORDER — DIGOXIN 125 MCG PO TABS: 0.1250 mg | ORAL_TABLET | Freq: Every day | ORAL | 3 refills | Status: DC

## 2020-09-19 MED ORDER — SACUBITRIL-VALSARTAN 24-26 MG PO TABS: 1.0000 | ORAL_TABLET | Freq: Two times a day (BID) | ORAL | 3 refills | Status: DC

## 2020-09-19 MED ORDER — APIXABAN 5 MG PO TABS: 5.0000 mg | ORAL_TABLET | Freq: Two times a day (BID) | ORAL | 3 refills | Status: DC

## 2020-09-19 MED ORDER — FUROSEMIDE 20 MG PO TABS: 20.0000 mg | ORAL_TABLET | Freq: Every day | ORAL | 0 refills | Status: DC | PRN

## 2020-09-19 NOTE — Telephone Encounter (Signed)
Follow Up:     Pt said her Shelly Bowers was not at the pharmacy when she went to get it today.   *STAT* If patient is at the pharmacy, call can be transferred to refill team.   1. Which medications need to be refilled? (please list name of each medication and dose if known) Entresto  2. Which pharmacy/location (including street and city if local pharmacy) is medication to be sent to?  [facility] [place]  3. Do they need a 30 day or 90 day supply? #180 and refills

## 2020-09-19 NOTE — Telephone Encounter (Signed)
Patient was called and informed that her Metoprolol succinate and entresto was ready for pick up and she informed me that she told the nurse while in the office today for her visit that she needed refills on Eliquis 5 mg BID, Lasix 20 mg daily, Entreso 24-26 mg BID. Shelly Bowers was informed that the refills she requested in office today were sent to her pharamcy along with the new medication she was started on which is the digoxin 0.125 mg daily. She thanked me for calling and stated that she will check with her pharmacy about the medications.

## 2020-09-19 NOTE — Patient Instructions (Signed)
Medication Instructions:   START DIGOXIN 0.125 MG ONCE DAILY  *If you need a refill on your cardiac medications before your next appointment, please call your pharmacy*   Follow-Up: At [facility], you and your health needs are our priority.  As part of our continuing mission to provide you with exceptional heart care, we have created designated Provider Care Teams.  These Care Teams include your primary Cardiologist (physician) and Advanced Practice Providers (APPs -  Physician Assistants and Nurse Practitioners) who all work together to provide you with the care you need, when you need it.  We recommend signing up for the patient portal called "MyChart".  Sign up information is provided on this After Visit Summary.  MyChart is used to connect with patients for Virtual Visits (Telemedicine).  Patients are able to view lab/test results, encounter notes, upcoming appointments, etc.  Non-urgent messages can be sent to your provider as well.   To learn more about what you can do with MyChart, go to [email or web address].    Your next appointment:   2 month(s)  The format for your next appointment:   In Person  Provider:   Minus Breeding, MD

## 2020-09-20 ENCOUNTER — Encounter: Payer: Self-pay | Admitting: Cardiology

## 2020-09-22 ENCOUNTER — Ambulatory Visit: Payer: Medicare HMO | Admitting: Cardiology

## 2020-10-11 ENCOUNTER — Other Ambulatory Visit: Payer: Self-pay | Admitting: Cardiology

## 2020-11-07 ENCOUNTER — Ambulatory Visit: Payer: Medicare HMO | Admitting: Neurology

## 2020-11-07 ENCOUNTER — Encounter: Payer: Self-pay | Admitting: Neurology

## 2020-11-07 ENCOUNTER — Other Ambulatory Visit: Payer: Self-pay

## 2020-11-07 VITALS — BP 120/80 | HR 132 | Ht 65.0 in | Wt 177.0 lb

## 2020-11-07 DIAGNOSIS — R0683 Snoring: Secondary | ICD-10-CM | POA: Diagnosis not present

## 2020-11-07 DIAGNOSIS — I5021 Acute systolic (congestive) heart failure: Secondary | ICD-10-CM | POA: Diagnosis not present

## 2020-11-07 DIAGNOSIS — R351 Nocturia: Secondary | ICD-10-CM

## 2020-11-07 DIAGNOSIS — I420 Dilated cardiomyopathy: Secondary | ICD-10-CM

## 2020-11-07 DIAGNOSIS — I4891 Unspecified atrial fibrillation: Secondary | ICD-10-CM | POA: Diagnosis not present

## 2020-11-07 NOTE — Patient Instructions (Signed)
Cardiomyopathy, Adult  Cardiomyopathy is a long-term (chronic) disease of the heart muscle. The disease makes the heart muscle thick, weak, or stiff. As a result, the heart works harder to pump blood. Over time, cardiomyopathy can lead to an irregular heartbeat (arrhythmia) and a condition in which the heart has trouble pumping blood (heart failure). There are several types of cardiomyopathy. The kind of cardiomyopathy that youhave depends on what part of the heart is affected and how it is affected. What are the causes? This condition may be caused by: A medical condition that damages the heart, such as diabetes, high blood pressure, or heart attack. Diseases of the immune system, connective tissues, or endocrine system. Alcohol abuse, use of illegal drugs, and taking certain medicines. Pregnancy. Too much iron in the body (hemochromatosis). Cancer treatments. Protein buildup in the organs or inflammation in the organs. In some cases, the cause is not known. What increases the risk? You are more likely to develop this condition if: You have a gene that is passed down (inherited) from a family member. You are overweight or obese. What are the signs or symptoms? Often, people with this condition have no symptoms. If you do have symptoms, this may include: Shortness of breath, especially during activity. Fatigue. An arrhythmia. Feeling dizzy or light-headed, or fainting. Chest pain. Coughing. Swelling of the lower legs, feet, abdomen, or neck veins. How is this diagnosed? This condition is diagnosed based on: Your symptoms and medical history. A physical exam. Blood tests and imaging studies, such as X-ray, echocardiogram, or MRI. Other tests, such as: An electrocardiogram (ECG). A portable heart monitor that records your heart's electrical activity (event monitor). A stress test. Cardiac catheterization and coronary angiogram. Heart tissue biopsy. An MRI of the heart. How is  this treated? Your treatment depends on the type and severity of your symptoms. If you do not have symptoms, you may not need treatment. Treatment may include: General healthy lifestyle changes. Medicines to: Treat high blood pressure, abnormal heart rate, or inflammation. Clear excess fluids from your body. Prevent blood clots. Balance minerals (electrolytes) in your body and get rid of extra sodium in your body. Strengthen your heartbeat. Surgery to: Repair a heart defect, remove heart tissue, or destroy tissues in the area of abnormal electrical activity (ablation). Implant a device to treat serious heart rhythm problems or to restore the heart's ability to pump blood, such as a pacemaker or a defibrillator. Replace your heart with a healthy heart. This is done if all other treatments have failed. Other treatment may include cardiac resynchronization therapy (CRT). Thisrestores the heart's normal beating pattern. Follow these instructions at home: Medicines Take over-the-counter and prescription medicines only as told by your health care provider. Some medicines can be dangerous for your heart. If you are prescribed a blood thinner, make sure you understand what to do in a bleeding situation. Tell all health care providers, including your dentist, that you have cardiomyopathy. Ask your health care provider if you need antibiotics before having dental care or before surgery. Lifestyle  Eat a heart-healthy diet that includes plenty of fruits, vegetables, whole grains, and foods that are low in salt (sodium). Maintain a healthy weight. Stay physically active. Ask your health care provider what activities are safe for you. Do not use any products that contain nicotine or tobacco, such as cigarettes, e-cigarettes, and chewing tobacco. If you need help quitting, ask your health care provider. Try to get at least 7 hours of sleep each night. Find  healthy ways to manage stress.  Alcohol  use Do not drink alcohol if: Your health care provider tells you not to drink. You are pregnant, may be pregnant, or are planning to become pregnant. If you drink alcohol: Limit how much you use to: 0-1 drink a day for women. 0-2 drinks a day for men. Be aware of how much alcohol is in your drink. In the [place] one drink equals one 12 oz bottle of beer (355 mL), one 5 oz glass of wine (148 mL), or one 1 oz glass of hard liquor (44 mL). General instructions Ask your health care provider if you should wear a medical identification bracelet. This may be important if you have a pacemaker or a defibrillator. Get all needed vaccines. Get a flu shot every year. Work closely with your health care provider to manage any other chronic conditions. If you plan to start a family, talk with a genetic counselor to discuss the risk of having a child with cardiomyopathy. Keep all follow-up visits as told by your health care provider. This is important, even if you do not have any symptoms. Your health care provider may need to make sure your condition is not getting worse. Where to find more information American Heart Association: www.heart.org Contact a health care provider if: Your symptoms get worse. You have new symptoms. Get help right away if you: Have severe chest pain. Have shortness of breath. Cough up a pink, bubbly substance. Feel nauseous and you vomit. Suddenly become light-headed or dizzy. Feel your heart beating very quickly. Feel like your heart is skipping beats. These symptoms may represent a serious problem that is an emergency. Do not wait to see if the symptoms will go away. Get medical help right away. Call your local emergency services (911 in the [place] Do not drive yourself to the hospital. Summary Cardiomyopathy is a long-term (chronic) disease of the heart muscle. Over time, cardiomyopathy can lead to an irregular heartbeat (arrhythmia) and heart failure. A number of  treatments are available for this condition. Your treatment will depend on the type and severity of your symptoms. Follow your health care provider's instructions on diet, medicines, physical activity, and when to seek help. This information is not intended to replace advice given to you by your health care provider. Make sure you discuss any questions you have with your healthcare provider. Document Revised: 12/17/2018 Document Reviewed: 12/17/2018 Elsevier Patient Education  2022 [place]. Atrial Fibrillation  Atrial fibrillation is a type of irregular or rapid heartbeat (arrhythmia). In atrial fibrillation, the top part of the heart (atria) beats in an irregular pattern. This makes the heart unable to pump bloodnormally and effectively. The goal of treatment is to prevent blood clots from forming, control your heart rate, or restore your heartbeat to a normal rhythm. If this condition is not treated, it can cause serious problems, such as a weakened heart muscle (cardiomyopathy) or a stroke. What are the causes? This condition is often caused by medical conditions that damage the heart's electrical system. These include: High blood pressure (hypertension). This is the most common cause. Certain heart problems or conditions, such as heart failure, coronary artery disease, heart valve problems, or heart surgery. Diabetes. Overactive thyroid (hyperthyroidism). Obesity. Chronic kidney disease. In some cases, the cause of this condition is not known. What increases the risk? This condition is more likely to develop in: Older people. People who smoke. Athletes who do endurance exercise. People who have a family history of atrial  fibrillation. Men. People who use drugs. People who drink a lot of alcohol. People who have lung conditions, such as emphysema, pneumonia, or COPD. People who have obstructive sleep apnea. What are the signs or symptoms? Symptoms of this condition include: A  feeling that your heart is racing or beating irregularly. Discomfort or pain in your chest. Shortness of breath. Sudden light-headedness or weakness. Tiring easily during exercise or activity. Fatigue. Syncope (fainting). Sweating. In some cases, there are no symptoms. How is this diagnosed? Your health care provider may detect atrial fibrillation when taking your pulse. If detected, this condition may be diagnosed with: An electrocardiogram (ECG) to check electrical signals of the heart. An ambulatory cardiac monitor to record your heart's activity for a few days. A transthoracic echocardiogram (TTE) to create pictures of your heart. A transesophageal echocardiogram (TEE) to create even closer pictures of your heart. A stress test to check your blood supply while you exercise. Imaging tests, such as a CT scan or chest X-ray. Blood tests. How is this treated? Treatment depends on underlying conditions and how you feel when you experience atrial fibrillation. This condition may be treated with: Medicines to prevent blood clots or to treat heart rate or heart rhythm problems. Electrical cardioversion to reset the heart's rhythm. A pacemaker to correct abnormal heart rhythm. Ablation to remove the heart tissue that sends abnormal signals. Left atrial appendage closure to seal the area where blood clots can form. In some cases, underlying conditions will be treated. Follow these instructions at home: Medicines Take over-the counter and prescription medicines only as told by your health care provider. Do not take any new medicines without talking to your health care provider. If you are taking blood thinners: Talk with your health care provider before you take any medicines that contain aspirin or NSAIDs, such as ibuprofen. These medicines increase your risk for dangerous bleeding. Take your medicine exactly as told, at the same time every day. Avoid activities that could cause injury or  bruising, and follow instructions about how to prevent falls. Wear a medical alert bracelet or carry a card that lists what medicines you take. Lifestyle     Do not use any products that contain nicotine or tobacco, such as cigarettes, e-cigarettes, and chewing tobacco. If you need help quitting, ask your health care provider. Eat heart-healthy foods. Talk with a dietitian to make an eating plan that is right for you. Exercise regularly as told by your health care provider. Do not drink alcohol. Lose weight if you are overweight. Do not use drugs, including cannabis. General instructions If you have obstructive sleep apnea, manage your condition as told by your health care provider. Do not use diet pills unless your health care provider approves. Diet pills can make heart problems worse. Keep all follow-up visits as told by your health care provider. This is important. Contact a health care provider if you: Notice a change in the rate, rhythm, or strength of your heartbeat. Are taking a blood thinner and you notice more bruising. Tire more easily when you exercise or do heavy work. Have a sudden change in weight. Get help right away if you have:  Chest pain, abdominal pain, sweating, or weakness. Trouble breathing. Side effects of blood thinners, such as blood in your vomit, stool, or urine, or bleeding that cannot stop. Any symptoms of a stroke. "BE FAST" is an easy way to remember the main warning signs of a stroke: B - Balance. Signs are dizziness, sudden trouble  walking, or loss of balance. E - Eyes. Signs are trouble seeing or a sudden change in vision. F - Face. Signs are sudden weakness or numbness of the face, or the face or eyelid drooping on one side. A - Arms. Signs are weakness or numbness in an arm. This happens suddenly and usually on one side of the body. S - Speech. Signs are sudden trouble speaking, slurred speech, or trouble understanding what people say. T - Time.  Time to call emergency services. Write down what time symptoms started. Other signs of a stroke, such as: A sudden, severe headache with no known cause. Nausea or vomiting. Seizure. These symptoms may represent a serious problem that is an emergency. Do not wait to see if the symptoms will go away. Get medical help right away. Call your local emergency services (911 in the [place] Do not drive yourself to the hospital. Summary Atrial fibrillation is a type of irregular or rapid heartbeat (arrhythmia). Symptoms include a feeling that your heart is beating fast or irregularly. You may be given medicines to prevent blood clots or to treat heart rate or heart rhythm problems. Get help right away if you have signs or symptoms of a stroke. Get help right away if you cannot catch your breath or have chest pain or pressure. This information is not intended to replace advice given to you by your health care provider. Make sure you discuss any questions you have with your healthcare provider. Document Revised: 09/03/2018 Document Reviewed: 09/03/2018 Elsevier Patient Education  [place]. Sleep Study, Adult A sleep study (polysomnogram) is a series of tests done while you are sleeping. A sleep study records your brain waves, heart rate, breathing rate, oxygen level, and eye and legmovements. A sleep study helps your health care provider: See how well you sleep. Diagnose a sleep disorder. Determine how severe your sleep disorder is. Create a plan to treat your sleep disorder. Your health care provider may recommend a sleep study if you: Feel sleepy on most days. Snore loudly while sleeping. Have unusual behaviors while you sleep, such as walking. Have brief periods in which you stop breathing during sleep (sleepapnea). Fall asleep suddenly during the day (narcolepsy). Have trouble falling asleep or staying asleep (insomnia). Feel like you need to move your legs when trying to fall asleep  (restless legs syndrome). Move your legs by flexing and extending them regularly while asleep (periodic limb movement disorder). Act out your dreams while you sleep (sleep behavior disorder). Feel like you cannot move when you first wake up (sleep paralysis). What tests are part of a sleep study? Most sleep studies record the following during sleep: Brain activity. Eye movements. Heart rate and rhythm. Breathing rate and rhythm. Blood-oxygen level. Blood pressure. Chest and belly movement as you breathe. Arm and leg movements. Snoring or other noises. Body position. Where are sleep studies done? Sleep studies are done at sleep centers. A sleep center may be inside [place], office, or clinic. The room where you have the study may look like a hospital room or a hotel room. The health care providers doing the study may come in and out of the room during the study. Most of the time, they will be in another room monitoringyour test as you sleep. How are sleep studies done? Most sleep studies are done during a normal period of time for a full night of sleep. You will arrive at the study center in the evening and go home in themorning. Before  the test Bring your pajamas and toothbrush with you to the sleep study. Do not have caffeine on the day of your sleep study. Do not drink alcohol on the day of your sleep study. Your health care provider will let you know if you should stop taking any of your regular medicines before the test. During the test     Round, sticky patches with sensors attached to recording wires (electrodes) are placed on your scalp, face, chest, and limbs. Wires from all the electrodes and sensors run from your bed to a computer. The wires can be taken off and put back on if you need to get out of bed to go to the bathroom. A sensor is placed over your nose to measure airflow. A finger clip is put on your finger or ear to measure your blood oxygen level (pulse  oximetry). A belt is placed around your belly and a belt is placed around your chest to measure breathing movements. If you have signs of the sleep disorder called sleep apnea during your test, you may get a treatment mask to wear for the second half of the night. The mask provides positive airway pressure (PAP) to help you breathe better during sleep. This may greatly improve your sleep apnea. You will then have all tests done again with the mask in place to see if your measurements and recordings change. After the test A medical doctor who specializes in sleep will evaluate the results of your sleep study and share them with you and your primary health care provider. Based on your results, your medical history, and a physical exam, you may be diagnosed with a sleep disorder, such as: Sleep apnea. Restless legs syndrome. Sleep-related behavior disorder. Sleep-related movement disorders. Sleep-related seizure disorders. Your health care team will help determine your treatment options based on your diagnosis. This may include: Improving your sleep habits (sleep hygiene). Wearing a continuous positive airway pressure (CPAP) or bi-level positive airway pressure (BPAP) mask. Wearing an oral device at night to improve breathing and reduce snoring. Taking medicines. Follow these instructions at home: Take over-the-counter and prescription medicines only as told by your health care provider. If you are instructed to use a CPAP or BPAP mask, make sure you use it nightly as directed. Make any lifestyle changes that your health care provider recommends. If you were given a device to open your airway while you sleep, use it only as told by your health care provider. Do not use any tobacco products, such as cigarettes, chewing tobacco, and e-cigarettes. If you need help quitting, ask your health care provider. Keep all follow-up visits as told by your health care provider. This is important. Summary A  sleep study (polysomnogram) is a series of tests done while you are sleeping. It shows how well you sleep. Most sleep studies are done over one full night of sleep. You will arrive at the study center in the evening and go home in the morning. If you have signs of the sleep disorder called sleep apnea during your test, you may get a treatment mask to wear for the second half of the night. A medical doctor who specializes in sleep will evaluate the results of your sleep study and share them with your primary health care provider. This information is not intended to replace advice given to you by your health care provider. Make sure you discuss any questions you have with your healthcare provider. Document Revised: 04/17/2019 Document Reviewed: 04/09/2017 Elsevier Patient Education  2022 [employer]  

## 2020-11-07 NOTE — Progress Notes (Signed)
SLEEP MEDICINE CLINIC    Provider:  Larey Seat, MD  Primary Care Physician:  Shelly Bowers, Shelly Halsted, MD [place] [address]     Referring Provider: Isaac Bowers, Shelly Bowers, [place] [address] [place],  [place] [address]          Chief Complaint according to patient   Patient presents with:     New Patient (Initial Visit)           HISTORY OF PRESENT ILLNESS:  Shelly Bowers is a 70 y.o.  White or Caucasian female patient seen here upon a  referral on 11/07/2020 from PCP, for  recent onset of atrial fibrillation.   Shelly Bowers was so last seen by Dr. Jerilynn Mages ---Deniece Ree on 6-9 2022.  She has a history of hypertension hyperlipidemia and is overweight.  On Memorial Day May 2022 she developed shortness of breath and a cough and was concerned that she may have contracted COVID-19 , had a rattling cough at night, felt miserable. so on Tuesday that weekend she scheduled a visit at [facility].  While there she became short of breath again and her heart rate reached 170 bpm she was sent to the hospital directly for evaluation and was found to be in new onset atrial fibrillation with rapid ventricular response.  She was admitted on 5-31 and discharged 4 days later she had a transesophageal echo DCCV on 3 June was found to have a new heart failure with reduced ejection fraction of 30 to 35% by echocardiogram.  I am not sure if this was a diastolic heart failure related to atrial fibrillation.  She was started on metoprolol 25 mg for rate control.  She was also anticoagulated on Eliquis.  After hospital discharge her apple watch has alarmed her several times to have had rapid heart rates and the on-call cardiologist recommended increasing the metoprolol to 25 twice daily.  She has been relatively well on this medication she is no longer short of breath.  But she has noted difficulty sleeping, as she wakes up frequently through the night and does  have to urinate more frequently than she used to.  She also is on Lasix 20 mg which may contribute to her nocturnal bathroom breaks.  She takes Nasacort as needed for nasal allergies.  She has been on prescription vitamin D supplement.  Recent blood pressures have been in normal range systolic between 0000000 and 120 and her heart rate is not bradycardic 45 to 50 bpm.   Dr.Hochrein is her cardiologist. by estimation, is 30 to 35%. Left ventricular ejection fraction by 3D volume is 30 %. The left ventricle has moderately decreased function. The left ventricle demonstrates global hypokinesis. 2. The mitral valve is degenerative. Mild mitral valve regurgitation with atrial functional regurgitation and two jets: A1-P1 and A3-P3. 3. Left atrial size was moderately dilated. No left atrial/left atrial appendage thrombus was detected. 4. Evidence of atrial level shunting detected by color flow Doppler. Agitated saline contrast bubble study was positive with shunting observed within 3-6 cardiac cycles suggestive of interatrial shunt. There is a small patent foramen ovale. 5. Right ventricular systolic function is mildly to moderately reduced. The right ventricular size is normal. 6. Right atrial size was mildly dilated.  Chief concern according to patient :  atrial fibrillation ,recurrent tachycardia - per apple watch. Currently asymptomatic.    I have the pleasure of seeing Shelly Bowers today, a right -  handed Caucasian female with a possible sleep disorder.  She has a  has a past medical history of Atrial fibrillation ([place]), Cardiomyopathy ([place]), HTN (hypertension), Hyperlipidemia, and Osteopenia..   The patient has not had sleep study . Sleep relevant medical history: Nocturia/ Coughing at night is improved. Atrial fib asymptomatic March - June  by apple watch.  Family medical /sleep history: no other family member on CPAP with OSA, insomnia, sleep walkers.    Social history:  Patient is working  part time at [employer].and lives in a household with persons husband- one dog.  Tobacco use/ n-a .  ETOH use - quit in June 2022,  Caffeine intake in form of Coffee(  decaffeinated ) Soda( /) Tea ( /) or energy drinks. Regular exercise in form of walking.  Hobbies : Landscape architect.    Sleep habits are as follows: The patient's dinner time is between 7.30 PM. The patient goes to bed at 10 PM and continues to sleep for 8 hours, wakes for 3-4 bathroom breaks.   The preferred sleep position is sideways ,  right , with the support of 1 pillows.  Dreams are reportedly frequent/vivid.  10  AM is the usual rise time. The patient wakes up spontaneously.  She  reports not feeling refreshed or restored in AM, with symptoms such as dry mouth, and residual fatigue. Naps are taken frequently, lasting from 30 to 60 minutes and are more refreshing than nocturnal sleep.    Review of Systems: Out of a complete 14 system review, the patient complains of only the following symptoms, and all other reviewed systems are negative.:  Fatigue, sleepiness , snoring, fragmented sleep, Insomnia -NOCTURIA.    How likely are you to doze in the following situations: 0 = not likely, 1 = slight chance, 2 = moderate chance, 3 = high chance   Sitting and Reading? Watching Television? Sitting inactive in a public place (theater or meeting)? As a passenger in a car for an hour without a break? Lying down in the afternoon when circumstances permit? Sitting and talking to someone? Sitting quietly after lunch without alcohol? In a car, while stopped for a few minutes in traffic?   Total = 7/ 24 points   FSS endorsed at 34/ 63 points.   6/6/-2022: Paged by operator that patient's HR is 146.  Recent discharge on 08/26/20 for AF/RVR and underwent TEE/DCCV (08/25/20). She was started on eliquis/entresto and metoprolol. Dr. Percival Spanish saw her on 08/26/20 after successful DCCV. EF 30-35% with global HK on TTE on  08/24/20 with moderate LA dilation.    Patient said she was watching TV and her apple watch alarmed that her HR was 157 for the prior 10 minutes. Currently at HR of 95. Lowest it was today was 56, high of 157. SOB is improved since discharge  In retrospect she might feel a little when she lies on her left side but she really does not notice that she is particular out of rhythm.  She is not having any shortness of breath, PND or orthopnea.  She is not having any palpitations, presyncope or syncope.  She does drink a couple glasses of wine each night and wonders if that could have been a trigger.  Of note in the hospital she did have bradycardia post cardioversion and her QT interval was prolonged on that EKG.  Her heart rate in fibrillation at home has been frequently above 110.  She did have her dose of beta-blocker increased slightly.  She has not had any problems with her blood thinner.  She had no new shortness of breath, PND or orthopnea.  She has had no increased lower extremity swelling Social History   Socioeconomic History   Marital status: Married    Spouse name: Not on file   Number of children: Not on file   Years of education: Not on file   Highest education level: Not on file  Occupational History   Not on file  Tobacco Use   Smoking status: Never   Smokeless tobacco: Never  Substance and Sexual Activity   Alcohol use: Yes    Comment: 1 glass of wine sometimes   Drug use: No   Sexual activity: Not on file  Other Topics Concern   Not on file  Social History Narrative   Left handed   Caffeine use: tea sometimes   Soda-zero sugar/no caffeine ginger ale   1-2 cups coffee per day (decaf)   Social Determinants of Health   Financial Resource Strain: Not on file  Food Insecurity: Not on file  Transportation Needs: Not on file  Physical Activity: Not on file  Stress: Not on file  Social Connections: Not on file    Family History  Problem Relation Age of Onset   Breast  cancer Mother 76   Atrial fibrillation Father     Past Medical History:  Diagnosis Date   Atrial fibrillation ([place])    Cardiomyopathy ([place])    HTN (hypertension)    Hyperlipidemia    Osteopenia     Past Surgical History:  Procedure Laterality Date   AUGMENTATION MAMMAPLASTY Bilateral    BREAST EXCISIONAL BIOPSY Left    BUBBLE STUDY  08/25/2020   Procedure: BUBBLE STUDY;  Surgeon: Werner Lean, MD;  Location: [place];  Service: Cardiovascular;;   CARDIOVERSION N/A 08/25/2020   Procedure: CARDIOVERSION;  Surgeon: Werner Lean, MD;  Location: [place];  Service: Cardiovascular;  Laterality: N/A;   INCISION AND DRAINAGE ABSCESS Left 12/27/2015   Procedure: INCISION AND DRAINAGE ABSCESS;  Surgeon: Daryll Brod, MD;  Location: [place];  Service: Orthopedics;  Laterality: Left;   TEE WITHOUT CARDIOVERSION N/A 08/25/2020   Procedure: TRANSESOPHAGEAL ECHOCARDIOGRAM (TEE);  Surgeon: Werner Lean, MD;  Location[facility];  Service: Cardiovascular;  Laterality: N/A;     Current Outpatient Medications on File Prior to Visit  Medication Sig Dispense Refill   apixaban (ELIQUIS) 5 MG TABS tablet Take 1 tablet (5 mg total) by mouth 2 (two) times daily. 180 tablet 3   atorvastatin (LIPITOR) 10 MG tablet TAKE ONE TABLET BY MOUTH ONE TIME DAILY 90 tablet 1   celecoxib (CELEBREX) 200 MG capsule TAKE 1 CAPSULE BY MOUTH 2 TIMES DAILY AS NEEDED 90 capsule 1   Cyanocobalamin (B-12 PO) Take 1 tablet by mouth daily.     digoxin (LANOXIN) 0.125 MG tablet Take 1 tablet (0.125 mg total) by mouth daily. 90 tablet 3   FOLIC ACID PO Take 1 tablet by mouth daily.     furosemide (LASIX) 20 MG tablet TAKE 1 TABLET (20 MG TOTAL) BY MOUTH DAILY AS NEEDED FOR EDEMA (WEIGHT GAIN). 30 tablet 5   ibuprofen (ADVIL,MOTRIN) 200 MG tablet Take 400 mg by mouth every 6 (six) hours as needed for headache, fever or mild pain.     metoprolol succinate (TOPROL XL) 25 MG 24 hr  tablet Take 1 tablet (25 mg total) by mouth 2 (two) times daily. 60 tablet 11   OVER  THE COUNTER MEDICATION Aller Tec     sacubitril-valsartan (ENTRESTO) 24-26 MG Take 1 tablet by mouth 2 (two) times daily. 180 tablet 3   triamcinolone (NASACORT) 55 MCG/ACT AERO nasal inhaler Place 2 sprays into the nose 2 (two) times daily as needed (allergies).     vitamin E 1000 UNIT capsule Take 1,000 Units by mouth daily.     No current facility-administered medications on file prior to visit.    Allergies  Allergen Reactions   Codeine Nausea Only   Codeine Sulfate     REACTION: unspecified   Hydrocodone Nausea And Vomiting    Physical exam:  Today's Vitals   11/07/20 1457  BP: 120/80  Pulse: (!) 132  SpO2: 99%  Weight: 177 lb (80.3 kg)  Height: '5\' 5"'$  (1.651 m)   Body mass index is 29.45 kg/m.   Wt Readings from Last 3 Encounters:  11/07/20 177 lb (80.3 kg)  09/19/20 177 lb 3.2 oz (80.4 kg)  09/01/20 178 lb 8 oz (81 kg)     Ht Readings from Last 3 Encounters:  11/07/20 '5\' 5"'$  (1.651 m)  09/19/20 5' 4.5" (1.638 m)  08/23/20 '5\' 6"'$  (1.676 m)      General: The patient is awake, alert and appears not in acute distress. The patient is well groomed. Head: Normocephalic, atraumatic. Neck is supple. Mallampati 2,  neck circumference:14.5 inches . Nasal airflow patent.  Retrognathia is  seen.  Dental status:  Cardiovascular:  Regular rate and cardiac rhythm by pulse,  without distended neck veins. Respiratory: Lungs are clear to auscultation.  Skin:  Without evidence of ankle edema, or rash. Trunk: The patient's posture is erect.   Neurologic exam : The patient is awake and alert, oriented to place and time.   Memory subjective described as intact.  Attention span & concentration ability appears normal.  Speech is fluent,  without  dysarthria, dysphonia or aphasia.  Mood and affect are appropriate.   Cranial nerves: no loss of smell or taste reported  Pupils are equal and briskly  reactive to light. Funduscopic exam deferred..  Extraocular movements in vertical and horizontal planes were intact and without nystagmus. No Diplopia. Visual fields by finger perimetry are intact. Hearing was intact to soft voice and finger rubbing.    Facial sensation intact to fine touch.  Facial motor strength is symmetric and tongue and uvula move midline.  Neck ROM : rotation, tilt and flexion extension were normal for age and shoulder shrug was symmetrical.    Motor exam:  Symmetric bulk, tone and ROM.   Normal tone without cog wheeling, symmetric grip strength .   Sensory:  Fine touch and vibration were normal.  Proprioception tested in the upper extremities was normal.   Coordination: Rapid alternating movements in the fingers/hands were of normal speed.  The Finger-to-nose maneuver was intact without evidence of ataxia, dysmetria or tremor.   Gait and station: Patient could rise unassisted from a seated position, walked without assistive device.  Stance is of normal width/ base and the patient turned with 3-4  steps.  Toe and heel walk were deferred.  Deep tendon reflexes: in the  upper and lower extremities are symmetric and intact.  Babinski response was deferred.       After spending a total time of  45  minutes face to face and additional time for physical and neurologic examination, review of laboratory studies,  personal review of imaging studies, reports and results of other testing and review of  referral information / records as far as provided in visit, I have established the following assessments:  Cardiomyopathy, CHF- ankle edema, abdominal distention,  EF 30% and atrial fib. Nocturia. Snoring. RLS.     My Plan is to proceed with:  1) SPLIT night or HST.  2) alcohol , 1 drink at day, caffeine 1-2 cups a day,  3) lasix every other day. - nocturia.  I would like to thank Shelly Bowers, Shelly Halsted, MD and Shelly Bowers, Shelly Bowers, [place] [place],  [place] [address] for allowing me to meet with and to take care of this pleasant patient.   In short, Shelly Bowers is presenting with possible OSA/ CSA   I plan to follow up either personally or through our NP within 3 month.   CC: I will share my notes with PCP .  Electronically signed by: Larey Seat, MD 11/07/2020 3:10 PM  [facility] [place] certified by The [employer] of Sleep Medicine and Diplomate of the [employer] of Sleep Medicine. Board certified In Neurology through the [place], Fellow of the [employer] of Neurology. Medical Director of [employer].

## 2020-11-08 ENCOUNTER — Ambulatory Visit: Payer: Medicare HMO | Admitting: Cardiology

## 2020-11-08 ENCOUNTER — Encounter: Payer: Self-pay | Admitting: Cardiology

## 2020-11-08 ENCOUNTER — Encounter: Payer: Self-pay | Admitting: *Deleted

## 2020-11-08 VITALS — BP 106/66 | HR 95 | Ht 65.0 in | Wt 176.6 lb

## 2020-11-08 DIAGNOSIS — I4891 Unspecified atrial fibrillation: Secondary | ICD-10-CM | POA: Diagnosis not present

## 2020-11-08 DIAGNOSIS — I10 Essential (primary) hypertension: Secondary | ICD-10-CM | POA: Diagnosis not present

## 2020-11-08 DIAGNOSIS — I480 Paroxysmal atrial fibrillation: Secondary | ICD-10-CM | POA: Diagnosis not present

## 2020-11-08 DIAGNOSIS — I4819 Other persistent atrial fibrillation: Secondary | ICD-10-CM

## 2020-11-08 DIAGNOSIS — I5022 Chronic systolic (congestive) heart failure: Secondary | ICD-10-CM | POA: Diagnosis not present

## 2020-11-08 MED ORDER — METOPROLOL TARTRATE 25 MG PO TABS: 25.0000 mg | ORAL_TABLET | Freq: Once | ORAL | 0 refills | Status: DC | PRN

## 2020-11-08 NOTE — Progress Notes (Signed)
Electrophysiology Office Note:    Date:  11/08/2020   ID:  Shelly Bowers, DOB Oct 06, 1950, MRN [id number]  PCP:  Isaac Bliss, Rayford Halsted, MD  [facility] Cardiologist:  Minus Breeding, MD  [facility] Electrophysiologist:  Vickie Epley, MD   Referring MD: Minus Breeding, MD   Chief Complaint: Atrial fibrillation  History of Present Illness:    Shelly Bowers is a 70 y.o. female who presents for an evaluation of atrial fibrillation at the request of Dr. Percival Spanish. Their medical history includes hypertension, hyperlipidemia, atrial fibrillation.  The patient was seen by Dr. Percival Spanish on September 19, 2020.  This was in the setting of a recent hospitalization for atrial fibrillation with rapid ventricular rates and an ejection fraction of 30 to 35%.  A prolonged QT was noted by Dr. Percival Spanish precluding safe use of dofetilide.  She is with her husband today in clinic.  She is currently being evaluated for probable OSA by Dr Brett Fairy.  Past Medical History:  Diagnosis Date   Atrial fibrillation ([place])    Cardiomyopathy ([place])    HTN (hypertension)    Hyperlipidemia    Osteopenia     Past Surgical History:  Procedure Laterality Date   AUGMENTATION MAMMAPLASTY Bilateral    BREAST EXCISIONAL BIOPSY Left    BUBBLE STUDY  08/25/2020   Procedure: BUBBLE STUDY;  Surgeon: Werner Lean, MD;  Location: [place];  Service: Cardiovascular;;   CARDIOVERSION N/A 08/25/2020   Procedure: CARDIOVERSION;  Surgeon: Werner Lean, MD;  Location: [place];  Service: Cardiovascular;  Laterality: N/A;   INCISION AND DRAINAGE ABSCESS Left 12/27/2015   Procedure: INCISION AND DRAINAGE ABSCESS;  Surgeon: Daryll Brod, MD;  Location: [place];  Service: Orthopedics;  Laterality: Left;   TEE WITHOUT CARDIOVERSION N/A 08/25/2020   Procedure: TRANSESOPHAGEAL ECHOCARDIOGRAM (TEE);  Surgeon: Werner Lean, MD;  Location: [facility] ENDOSCOPY;  Service:  Cardiovascular;  Laterality: N/A;    Current Medications: Current Meds  Medication Sig   apixaban (ELIQUIS) 5 MG TABS tablet Take 1 tablet (5 mg total) by mouth 2 (two) times daily.   atorvastatin (LIPITOR) 10 MG tablet TAKE ONE TABLET BY MOUTH ONE TIME DAILY   celecoxib (CELEBREX) 200 MG capsule TAKE 1 CAPSULE BY MOUTH 2 TIMES DAILY AS NEEDED   Cyanocobalamin (B-12 PO) Take 1 tablet by mouth daily.   digoxin (LANOXIN) 0.125 MG tablet Take 1 tablet (0.125 mg total) by mouth daily.   FOLIC ACID PO Take 1 tablet by mouth daily.   furosemide (LASIX) 20 MG tablet TAKE 1 TABLET (20 MG TOTAL) BY MOUTH DAILY AS NEEDED FOR EDEMA (WEIGHT GAIN).   ibuprofen (ADVIL,MOTRIN) 200 MG tablet Take 400 mg by mouth every 6 (six) hours as needed for headache, fever or mild pain.   metoprolol succinate (TOPROL XL) 25 MG 24 hr tablet Take 1 tablet (25 mg total) by mouth 2 (two) times daily.   OVER THE COUNTER MEDICATION Aller Tec   sacubitril-valsartan (ENTRESTO) 24-26 MG Take 1 tablet by mouth 2 (two) times daily.   triamcinolone (NASACORT) 55 MCG/ACT AERO nasal inhaler Place 2 sprays into the nose 2 (two) times daily as needed (allergies).   vitamin E 1000 UNIT capsule Take 1,000 Units by mouth daily.     Allergies:   Codeine, Codeine sulfate, and Hydrocodone   Social History   Socioeconomic History   Marital status: Married    Spouse name: Not on file   Number of children: Not on  file   Years of education: Not on file   Highest education level: Not on file  Occupational History   Not on file  Tobacco Use   Smoking status: Never   Smokeless tobacco: Never  Substance and Sexual Activity   Alcohol use: Yes    Comment: 1 glass of wine sometimes   Drug use: No   Sexual activity: Not on file  Other Topics Concern   Not on file  Social History Narrative   Left handed   Caffeine use: tea sometimes   Soda-zero sugar/no caffeine ginger ale   1-2 cups coffee per day (decaf)   Social Determinants  of Health   Financial Resource Strain: Not on file  Food Insecurity: Not on file  Transportation Needs: Not on file  Physical Activity: Not on file  Stress: Not on file  Social Connections: Not on file     Family History: The patient's family history includes Atrial fibrillation in her father; Breast cancer (age of onset: 37) in her mother.  ROS:   Please see the history of present illness.    All other systems reviewed and are negative.  EKGs/Labs/Other Studies Reviewed:    The following studies were reviewed today:  August 24, 2020 echo personally reviewed Left ventricular function moderate to severely decreased, 30% Right ventricular function mildly reduced Dilated left and right atrium Mild MR  EKG:  The ekg ordered today demonstrates atrial fibrillation. Low voltage QRS.  Recent Labs: 08/23/2020: B Natriuretic Peptide 370.5; TSH 2.304 09/01/2020: ALT 17; BUN 16; Creatinine, Ser 1.01; Hemoglobin 13.1; Magnesium 2.0; Platelets 235.0; Potassium 3.9; Sodium 137  Recent Lipid Panel    Component Value Date/Time   CHOL 219 (H) 01/22/2020 1115   TRIG 115 01/22/2020 1115   HDL 71 01/22/2020 1115   CHOLHDL 3.1 01/22/2020 1115   VLDL 38.4 08/21/2017 0940   LDLCALC 125 (H) 01/22/2020 1115   LDLDIRECT 191.9 04/14/2013 0856    Physical Exam:    VS:  BP 106/66   Pulse 95   Ht '5\' 5"'$  (1.651 m)   Wt 176 lb 9.6 oz (80.1 kg)   SpO2 96%   BMI 29.39 kg/m     Wt Readings from Last 3 Encounters:  11/08/20 176 lb 9.6 oz (80.1 kg)  11/07/20 177 lb (80.3 kg)  09/19/20 177 lb 3.2 oz (80.4 kg)     GEN: Well nourished, well developed in no acute distress. Overweight. HEENT: Normal NECK: No JVD; No carotid bruits LYMPHATICS: No lymphadenopathy CARDIAC: irregularly irregular. no murmurs, rubs, gallops RESPIRATORY:  Clear to auscultation without rales, wheezing or rhonchi  ABDOMEN: Soft, non-tender, non-distended MUSCULOSKELETAL:  No edema; No deformity  SKIN: Warm and  dry NEUROLOGIC:  Alert and oriented x 3 PSYCHIATRIC:  Normal affect   ASSESSMENT:    1. Paroxysmal A-fib ([place])   2. Chronic systolic heart failure ([place])   3. Primary hypertension   4. Atrial fibrillation, unspecified type ([place])    PLAN:    In order of problems listed above:  1. Persistent atrial fibrillation (HCC) Symptomatic persistent atrial fibrillation. Given her reduced ejection fraction and hospitalizations for AF w/ RVR, a rhythm control strategy is indicated. I have discussed the options with the patient. We discussed antiarrhythmic drug therapy options and ablation. I think she is a good candidate for ablation. We discussed procedure details including the risks, efficacy and recovery time. She takes eliquis for stroke prophylaxis.   Risk, benefits, and alternatives to EP study and radiofrequency ablation  for afib were also discussed in detail today. These risks include but are not limited to stroke, bleeding, vascular damage, tamponade, perforation, damage to the esophagus, lungs, and other structures, pulmonary vein stenosis, worsening renal function, and death. The patient understands these risk and wishes to proceed.  We will therefore proceed with catheter ablation at the next available time.  Carto, ICE, anesthesia are requested for the procedure.  Will also obtain CT PV protocol prior to the procedure to exclude LAA thrombus and further evaluate atrial anatomy.   2. Chronic systolic heart failure (HCC) NYHA II-III. Warm and relatively euvolemic today. Suspect primary contributor is her atrial fibrillation with rapid ventricular rates. Continue digoxin, lasix, metoprolol, entresto. At the 3 month mark after ablation, will plan to discontinue digoxin.   3. Primary hypertension At goal today.  4. Atrial fibrillation, unspecified type ([employer]) See #1.        Total time spent with patient today 65 minutes. This includes reviewing records, evaluating the patient and  coordinating care.  Medication Adjustments/Labs and Tests Ordered: Current medicines are reviewed at length with the patient today.  Concerns regarding medicines are outlined above.  Orders Placed This Encounter  Procedures   CT CARDIAC MORPH/PULM VEIN W/CM&W/O CA SCORE   CBC w/Diff   Basic Metabolic Panel (BMET)   EKG 12-Lead    No orders of the defined types were placed in this encounter.    Signed, Hilton Cork. Quentin Ore, MD, [facility], [facility] 11/08/2020 9:53 PM    Electrophysiology  [facility]

## 2020-11-08 NOTE — H&P (View-Only) (Signed)
Electrophysiology Office Note:    Date:  11/08/2020   ID:  Shelly Bowers, DOB November 20, 1950, MRN [id number]  PCP:  Isaac Bliss, Rayford Halsted, MD  [facility] Cardiologist:  Minus Breeding, MD  [facility] Electrophysiologist:  Vickie Epley, MD   Referring MD: Minus Breeding, MD   Chief Complaint: Atrial fibrillation  History of Present Illness:    Shelly Bowers is a 70 y.o. female who presents for an evaluation of atrial fibrillation at the request of Dr. Percival Spanish. Their medical history includes hypertension, hyperlipidemia, atrial fibrillation.  The patient was seen by Dr. Percival Spanish on September 19, 2020.  This was in the setting of a recent hospitalization for atrial fibrillation with rapid ventricular rates and an ejection fraction of 30 to 35%.  A prolonged QT was noted by Dr. Percival Spanish precluding safe use of dofetilide.  She is with her husband today in clinic.  She is currently being evaluated for probable OSA by Dr Brett Fairy.  Past Medical History:  Diagnosis Date   Atrial fibrillation ([place])    Cardiomyopathy ([place])    HTN (hypertension)    Hyperlipidemia    Osteopenia     Past Surgical History:  Procedure Laterality Date   AUGMENTATION MAMMAPLASTY Bilateral    BREAST EXCISIONAL BIOPSY Left    BUBBLE STUDY  08/25/2020   Procedure: BUBBLE STUDY;  Surgeon: Werner Lean, MD;  Location: [facility];  Service: Cardiovascular;;   CARDIOVERSION N/A 08/25/2020   Procedure: CARDIOVERSION;  Surgeon: Werner Lean, MD;  Location: [facility];  Service: Cardiovascular;  Laterality: N/A;   INCISION AND DRAINAGE ABSCESS Left 12/27/2015   Procedure: INCISION AND DRAINAGE ABSCESS;  Surgeon: Daryll Brod, MD;  Location: [place];  Service: Orthopedics;  Laterality: Left;   TEE WITHOUT CARDIOVERSION N/A 08/25/2020   Procedure: TRANSESOPHAGEAL ECHOCARDIOGRAM (TEE);  Surgeon: Werner Lean, MD;  Location[facility] ENDOSCOPY;  Service:  Cardiovascular;  Laterality: N/A;    Current Medications: Current Meds  Medication Sig   apixaban (ELIQUIS) 5 MG TABS tablet Take 1 tablet (5 mg total) by mouth 2 (two) times daily.   atorvastatin (LIPITOR) 10 MG tablet TAKE ONE TABLET BY MOUTH ONE TIME DAILY   celecoxib (CELEBREX) 200 MG capsule TAKE 1 CAPSULE BY MOUTH 2 TIMES DAILY AS NEEDED   Cyanocobalamin (B-12 PO) Take 1 tablet by mouth daily.   digoxin (LANOXIN) 0.125 MG tablet Take 1 tablet (0.125 mg total) by mouth daily.   FOLIC ACID PO Take 1 tablet by mouth daily.   furosemide (LASIX) 20 MG tablet TAKE 1 TABLET (20 MG TOTAL) BY MOUTH DAILY AS NEEDED FOR EDEMA (WEIGHT GAIN).   ibuprofen (ADVIL,MOTRIN) 200 MG tablet Take 400 mg by mouth every 6 (six) hours as needed for headache, fever or mild pain.   metoprolol succinate (TOPROL XL) 25 MG 24 hr tablet Take 1 tablet (25 mg total) by mouth 2 (two) times daily.   OVER THE COUNTER MEDICATION Aller Tec   sacubitril-valsartan (ENTRESTO) 24-26 MG Take 1 tablet by mouth 2 (two) times daily.   triamcinolone (NASACORT) 55 MCG/ACT AERO nasal inhaler Place 2 sprays into the nose 2 (two) times daily as needed (allergies).   vitamin E 1000 UNIT capsule Take 1,000 Units by mouth daily.     Allergies:   Codeine, Codeine sulfate, and Hydrocodone   Social History   Socioeconomic History   Marital status: Married    Spouse name: Not on file   Number of children: Not on  file   Years of education: Not on file   Highest education level: Not on file  Occupational History   Not on file  Tobacco Use   Smoking status: Never   Smokeless tobacco: Never  Substance and Sexual Activity   Alcohol use: Yes    Comment: 1 glass of wine sometimes   Drug use: No   Sexual activity: Not on file  Other Topics Concern   Not on file  Social History Narrative   Left handed   Caffeine use: tea sometimes   Soda-zero sugar/no caffeine ginger ale   1-2 cups coffee per day (decaf)   Social Determinants  of Health   Financial Resource Strain: Not on file  Food Insecurity: Not on file  Transportation Needs: Not on file  Physical Activity: Not on file  Stress: Not on file  Social Connections: Not on file     Family History: The patient's family history includes Atrial fibrillation in her father; Breast cancer (age of onset: 25) in her mother.  ROS:   Please see the history of present illness.    All other systems reviewed and are negative.  EKGs/Labs/Other Studies Reviewed:    The following studies were reviewed today:  August 24, 2020 echo personally reviewed Left ventricular function moderate to severely decreased, 30% Right ventricular function mildly reduced Dilated left and right atrium Mild MR  EKG:  The ekg ordered today demonstrates atrial fibrillation. Low voltage QRS.  Recent Labs: 08/23/2020: B Natriuretic Peptide 370.5; TSH 2.304 09/01/2020: ALT 17; BUN 16; Creatinine, Ser 1.01; Hemoglobin 13.1; Magnesium 2.0; Platelets 235.0; Potassium 3.9; Sodium 137  Recent Lipid Panel    Component Value Date/Time   CHOL 219 (H) 01/22/2020 1115   TRIG 115 01/22/2020 1115   HDL 71 01/22/2020 1115   CHOLHDL 3.1 01/22/2020 1115   VLDL 38.4 08/21/2017 0940   LDLCALC 125 (H) 01/22/2020 1115   LDLDIRECT 191.9 04/14/2013 0856    Physical Exam:    VS:  BP 106/66   Pulse 95   Ht '5\' 5"'$  (1.651 m)   Wt 176 lb 9.6 oz (80.1 kg)   SpO2 96%   BMI 29.39 kg/m     Wt Readings from Last 3 Encounters:  11/08/20 176 lb 9.6 oz (80.1 kg)  11/07/20 177 lb (80.3 kg)  09/19/20 177 lb 3.2 oz (80.4 kg)     GEN: Well nourished, well developed in no acute distress. Overweight. HEENT: Normal NECK: No JVD; No carotid bruits LYMPHATICS: No lymphadenopathy CARDIAC: irregularly irregular. no murmurs, rubs, gallops RESPIRATORY:  Clear to auscultation without rales, wheezing or rhonchi  ABDOMEN: Soft, non-tender, non-distended MUSCULOSKELETAL:  No edema; No deformity  SKIN: Warm and  dry NEUROLOGIC:  Alert and oriented x 3 PSYCHIATRIC:  Normal affect   ASSESSMENT:    1. Paroxysmal A-fib ([place])   2. Chronic systolic heart failure ([place])   3. Primary hypertension   4. Atrial fibrillation, unspecified type ([place])    PLAN:    In order of problems listed above:  1. Persistent atrial fibrillation (HCC) Symptomatic persistent atrial fibrillation. Given her reduced ejection fraction and hospitalizations for AF w/ RVR, a rhythm control strategy is indicated. I have discussed the options with the patient. We discussed antiarrhythmic drug therapy options and ablation. I think she is a good candidate for ablation. We discussed procedure details including the risks, efficacy and recovery time. She takes eliquis for stroke prophylaxis.   Risk, benefits, and alternatives to EP study and radiofrequency ablation  for afib were also discussed in detail today. These risks include but are not limited to stroke, bleeding, vascular damage, tamponade, perforation, damage to the esophagus, lungs, and other structures, pulmonary vein stenosis, worsening renal function, and death. The patient understands these risk and wishes to proceed.  We will therefore proceed with catheter ablation at the next available time.  Carto, ICE, anesthesia are requested for the procedure.  Will also obtain CT PV protocol prior to the procedure to exclude LAA thrombus and further evaluate atrial anatomy.   2. Chronic systolic heart failure (HCC) NYHA II-III. Warm and relatively euvolemic today. Suspect primary contributor is her atrial fibrillation with rapid ventricular rates. Continue digoxin, lasix, metoprolol, entresto. At the 3 month mark after ablation, will plan to discontinue digoxin.   3. Primary hypertension At goal today.  4. Atrial fibrillation, unspecified type ([place]) See #1.        Total time spent with patient today 65 minutes. This includes reviewing records, evaluating the patient and  coordinating care.  Medication Adjustments/Labs and Tests Ordered: Current medicines are reviewed at length with the patient today.  Concerns regarding medicines are outlined above.  Orders Placed This Encounter  Procedures   CT CARDIAC MORPH/PULM VEIN W/CM&W/O CA SCORE   CBC w/Diff   Basic Metabolic Panel (BMET)   EKG 12-Lead    No orders of the defined types were placed in this encounter.    Signed, Hilton Cork. Quentin Ore, MD, [facility], [facility] 11/08/2020 9:53 PM    Electrophysiology [facility]

## 2020-11-08 NOTE — Patient Instructions (Addendum)
Medication Instructions:  Your physician recommends that you continue on your current medications as directed. Please refer to the Current Medication list given to you today.  Labwork: CBC, BMP  Testing/Procedures: Your physician has requested that you have cardiac CT. Cardiac computed tomography (CT) is a painless test that uses an x-ray machine to take clear, detailed pictures of your heart. For further information please visit [employer]. Please follow instruction sheet as given.   Your physician has recommended that you have an ablation. Catheter ablation is a medical procedure used to treat some cardiac arrhythmias (irregular heartbeats). During catheter ablation, a long, thin, flexible tube is put into a blood vessel in your groin (upper thigh), or neck. This tube is called an ablation catheter. It is then guided to your heart through the blood vessel. Radio frequency waves destroy small areas of heart tissue where abnormal heartbeats may cause an arrhythmia to start. Please see the instruction sheet given to you today.    Any Other Special Instructions Will Be Listed Below (If Applicable).  If you need a refill on your cardiac medications before your next appointment, please call your pharmacy.   Cardiac Ablation Cardiac ablation is a procedure to destroy (ablate) some heart tissue that is sending bad signals. These bad signals causeproblems in heart rhythm. The heart has many areas that make these signals. If there are problems in these areas, they can make the heart beat in a way that is not normal.Destroying some tissues can help make the heart rhythm normal. Tell your doctor about: Any allergies you have. All medicines you are taking. These include vitamins, herbs, eye drops, creams, and over-the-counter medicines. Any problems you or family members have had with medicines that make you fall asleep (anesthetics). Any blood disorders you have. Any surgeries you have  had. Any medical conditions you have, such as kidney failure. Whether you are pregnant or may be pregnant. What are the risks? This is a safe procedure. But problems may occur, including: Infection. Bruising and bleeding. Bleeding into the chest. Stroke or blood clots. Damage to nearby areas of your body. Allergies to medicines or dyes. The need for a pacemaker if the normal system is damaged. Failure of the procedure to treat the problem. What happens before the procedure? Medicines Ask your doctor about: Changing or stopping your normal medicines. This is important. Taking aspirin and ibuprofen. Do not take these medicines unless your doctor tells you to take them. Taking other medicines, vitamins, herbs, and supplements. General instructions Follow instructions from your doctor about what you cannot eat or drink. Plan to have someone take you home from the hospital or clinic. If you will be going home right after the procedure, plan to have someone with you for 24 hours. Ask your doctor what steps will be taken to prevent infection. What happens during the procedure?  An IV tube will be put into one of your veins. You will be given a medicine to help you relax. The skin on your neck or groin will be numbed. A cut (incision) will be made in your neck or groin. A needle will be put through your cut and into a large vein. A tube (catheter) will be put into the needle. The tube will be moved to your heart. Dye may be put through the tube. This helps your doctor see your heart. Small devices (electrodes) on the tube will send out signals. A type of energy will be used to destroy some heart tissue. The tube  will be taken out. Pressure will be held on your cut. This helps stop bleeding. A bandage will be put over your cut. The exact procedure may vary among doctors and hospitals. What happens after the procedure? You will be watched until you leave the hospital or clinic. This  includes checking your heart rate, breathing rate, oxygen, and blood pressure. Your cut will be watched for bleeding. You will need to lie still for a few hours. Do not drive for 24 hours or as long as your doctor tells you. Summary Cardiac ablation is a procedure to destroy some heart tissue. This is done to treat heart rhythm problems. Tell your doctor about any medical conditions you may have. Tell him or her about all medicines you are taking to treat them. This is a safe procedure. But problems may occur. These include infection, bruising, bleeding, and damage to nearby areas of your body. Follow what your doctor tells you about food and drink. You may also be told to change or stop some of your medicines. After the procedure, do not drive for 24 hours or as long as your doctor tells you. This information is not intended to replace advice given to you by your health care provider. Make sure you discuss any questions you have with your healthcare provider. Document Revised: 02/12/2019 Document Reviewed: 02/12/2019 Elsevier Patient Education  2022 [employer].

## 2020-11-09 ENCOUNTER — Telehealth: Payer: Self-pay | Admitting: Cardiology

## 2020-11-09 DIAGNOSIS — D225 Melanocytic nevi of trunk: Secondary | ICD-10-CM | POA: Diagnosis not present

## 2020-11-09 DIAGNOSIS — L570 Actinic keratosis: Secondary | ICD-10-CM | POA: Diagnosis not present

## 2020-11-09 DIAGNOSIS — L718 Other rosacea: Secondary | ICD-10-CM | POA: Diagnosis not present

## 2020-11-09 DIAGNOSIS — D0472 Carcinoma in situ of skin of left lower limb, including hip: Secondary | ICD-10-CM | POA: Diagnosis not present

## 2020-11-09 DIAGNOSIS — L821 Other seborrheic keratosis: Secondary | ICD-10-CM | POA: Diagnosis not present

## 2020-11-09 DIAGNOSIS — D485 Neoplasm of uncertain behavior of skin: Secondary | ICD-10-CM | POA: Diagnosis not present

## 2020-11-09 LAB — BASIC METABOLIC PANEL
BUN/Creatinine Ratio: 12 (ref 12–28)
BUN: 10 mg/dL (ref 8–27)
CO2: 24 mmol/L (ref 20–29)
Calcium: 9.6 mg/dL (ref 8.7–10.3)
Chloride: 103 mmol/L (ref 96–106)
Creatinine, Ser: 0.82 mg/dL (ref 0.57–1.00)
Glucose: 85 mg/dL (ref 65–99)
Potassium: 4.6 mmol/L (ref 3.5–5.2)
Sodium: 144 mmol/L (ref 134–144)
eGFR: 76 mL/min/{1.73_m2} (ref >59)

## 2020-11-09 LAB — CBC WITH DIFFERENTIAL/PLATELET
Basophils Absolute: 0 10*3/uL (ref 0.0–0.2)
Basos: 1 %
EOS (ABSOLUTE): 0.1 10*3/uL (ref 0.0–0.4)
Eos: 2 %
Hematocrit: 40 % (ref 34.0–46.6)
Hemoglobin: 13.1 g/dL (ref 11.1–15.9)
Immature Grans (Abs): 0 10*3/uL (ref 0.0–0.1)
Immature Granulocytes: 1 %
Lymphocytes Absolute: 2.2 10*3/uL (ref 0.7–3.1)
Lymphs: 35 %
MCH: 32.4 pg (ref 26.6–33.0)
MCHC: 32.8 g/dL (ref 31.5–35.7)
MCV: 99 fL — ABNORMAL HIGH (ref 79–97)
Monocytes Absolute: 0.7 10*3/uL (ref 0.1–0.9)
Monocytes: 11 %
Neutrophils Absolute: 3.3 10*3/uL (ref 1.4–7.0)
Neutrophils: 50 %
Platelets: 201 10*3/uL (ref 150–450)
RBC: 4.04 x10E6/uL (ref 3.77–5.28)
RDW: 13.3 % (ref 11.7–15.4)
WBC: 6.4 10*3/uL (ref 3.4–10.8)

## 2020-11-09 NOTE — Telephone Encounter (Signed)
Left message to call back   Triage team can schedule on acute spots - was going to offer visit on 12/15/20 with Dr. Percival Spanish for acute spot - post ablation visit

## 2020-11-09 NOTE — Telephone Encounter (Signed)
   Pt is scheduled for ablation on 11/21/20, pt would like to ask Dr. Percival Spanish if its ok for her to wait until November to f/u with him since that is his first available if she r/s her appt with Dr. Warren Lacy

## 2020-11-09 NOTE — Telephone Encounter (Signed)
Spoke with patient regarding appointment and when she will follow up post ablation  Ablation follow up not scheduled  Patient will just cancel follow up with Dr Percival Spanish and call back to schedule follow up with him after ablation follow up scheduled

## 2020-11-14 ENCOUNTER — Telehealth (HOSPITAL_COMMUNITY): Payer: Self-pay | Admitting: *Deleted

## 2020-11-14 NOTE — Telephone Encounter (Signed)
Attempted to call patient regarding upcoming cardiac CT appointment. °Left message on voicemail with name and callback number ° °Haylin Camilli RN Navigator Cardiac Imaging °[facility] °[phone] Office °[phone] Cell ° °

## 2020-11-14 NOTE — Telephone Encounter (Signed)
Patient returning call regarding upcoming cardiac imaging study; pt verbalizes understanding of appt date/time, parking situation and where to check in, pre-test NPO status and medications ordered; name and call back number provided for further questions should they arise  Gordy Clement RN Navigator Cardiac Imaging [facility] [phone] office [phone] cell  Patient to take daily HR control medications and will take '25mg'$  metoprolol tartrate two hours prior to cardiac CT if HR is greater than 70bpm.

## 2020-11-15 ENCOUNTER — Other Ambulatory Visit: Payer: Self-pay

## 2020-11-15 ENCOUNTER — Ambulatory Visit (HOSPITAL_COMMUNITY)
Admission: RE | Admit: 2020-11-15 | Discharge: 2020-11-15 | Disposition: A | Discharge status: Home / Self Care | Payer: Medicare HMO | Source: Ambulatory Visit | Attending: Cardiology | Admitting: Cardiology

## 2020-11-15 DIAGNOSIS — I4891 Unspecified atrial fibrillation: Secondary | ICD-10-CM

## 2020-11-15 MED ORDER — IOHEXOL 350 MG/ML SOLN
80.0000 mL | Freq: Once | INTRAVENOUS | Status: AC | PRN
  Administered 2020-11-15: 80 mL via INTRAVENOUS

## 2020-11-19 ENCOUNTER — Encounter: Payer: Self-pay | Admitting: Internal Medicine

## 2020-11-20 NOTE — Anesthesia Preprocedure Evaluation (Addendum)
Anesthesia Evaluation  Patient identified by MRN, date of birth, ID band Patient awake    Reviewed: Allergy & Precautions, NPO status , Patient's Chart, lab work & pertinent test results, reviewed documented beta blocker date and time   History of Anesthesia Complications Negative for: history of anesthetic complications  Airway Mallampati: III  TM Distance: >3 FB Neck ROM: Full    Dental  (+) Dental Advisory Given   Pulmonary neg pulmonary ROS,    Pulmonary exam normal        Cardiovascular hypertension, Pt. on home beta blockers and Pt. on medications + dysrhythmias Atrial Fibrillation  Rhythm:Irregular Rate:Normal   '22 TEE - EF 30 to 35%. Global hypokinesis. Mild MR with  atrial functional regurgitation and two jets: A1-P1 and A3-P3. LA moderately dilated. Evidence of atrial level shunting detected by color flow Doppler. Agitated saline contrast bubble study was positive with shunting observed within 3-6 cardiac cycles suggestive of interatrial shunt. There is a small patent foramen ovale. Right ventricular systolic function is mildly to moderately reduced. Right atrial size was mildly dilated. Aortic valve regurgitation is trivial.     Neuro/Psych negative neurological ROS  negative psych ROS   GI/Hepatic negative GI ROS, Neg liver ROS,   Endo/Other  negative endocrine ROS  Renal/GU negative Renal ROS     Musculoskeletal negative musculoskeletal ROS (+)   Abdominal   Peds  Hematology  On eliquis    Anesthesia Other Findings   Reproductive/Obstetrics                            Anesthesia Physical Anesthesia Plan  ASA: 3  Anesthesia Plan: General   Post-op Pain Management:    Induction: Intravenous  PONV Risk Score and Plan: 3 and Treatment may vary due to age or medical condition, Ondansetron and Dexamethasone  Airway Management Planned: Oral ETT  Additional Equipment:    Intra-op Plan:   Post-operative Plan: Extubation in OR  Informed Consent: I have reviewed the patients History and Physical, chart, labs and discussed the procedure including the risks, benefits and alternatives for the proposed anesthesia with the patient or authorized representative who has indicated his/her understanding and acceptance.     Dental advisory given  Plan Discussed with: CRNA and Anesthesiologist  Anesthesia Plan Comments: (Clearsight if available, otherwise arterial line)       Anesthesia Quick Evaluation

## 2020-11-21 ENCOUNTER — Encounter (HOSPITAL_COMMUNITY)
Admission: RE | Disposition: A | Discharge status: Home / Self Care | Payer: Self-pay | Source: Home / Self Care | Attending: Cardiology

## 2020-11-21 ENCOUNTER — Other Ambulatory Visit: Payer: Self-pay

## 2020-11-21 ENCOUNTER — Ambulatory Visit (HOSPITAL_COMMUNITY): Payer: Medicare HMO | Admitting: Anesthesiology

## 2020-11-21 ENCOUNTER — Telehealth: Payer: Self-pay | Admitting: Physician Assistant

## 2020-11-21 ENCOUNTER — Ambulatory Visit: Payer: Medicare HMO | Admitting: Cardiology

## 2020-11-21 ENCOUNTER — Ambulatory Visit (HOSPITAL_COMMUNITY)
Admission: RE | Admit: 2020-11-21 | Discharge: 2020-11-21 | Disposition: A | Discharge status: Home / Self Care | Payer: Medicare HMO | Attending: Cardiology | Admitting: Cardiology

## 2020-11-21 DIAGNOSIS — I4891 Unspecified atrial fibrillation: Secondary | ICD-10-CM | POA: Diagnosis not present

## 2020-11-21 DIAGNOSIS — I428 Other cardiomyopathies: Secondary | ICD-10-CM | POA: Diagnosis not present

## 2020-11-21 DIAGNOSIS — E785 Hyperlipidemia, unspecified: Secondary | ICD-10-CM | POA: Insufficient documentation

## 2020-11-21 DIAGNOSIS — Z803 Family history of malignant neoplasm of breast: Secondary | ICD-10-CM | POA: Diagnosis not present

## 2020-11-21 DIAGNOSIS — I4819 Other persistent atrial fibrillation: Secondary | ICD-10-CM | POA: Diagnosis not present

## 2020-11-21 DIAGNOSIS — I5022 Chronic systolic (congestive) heart failure: Secondary | ICD-10-CM | POA: Insufficient documentation

## 2020-11-21 DIAGNOSIS — Z885 Allergy status to narcotic agent status: Secondary | ICD-10-CM | POA: Insufficient documentation

## 2020-11-21 DIAGNOSIS — I110 Hypertensive heart disease with heart failure: Secondary | ICD-10-CM | POA: Insufficient documentation

## 2020-11-21 DIAGNOSIS — Z8249 Family history of ischemic heart disease and other diseases of the circulatory system: Secondary | ICD-10-CM | POA: Diagnosis not present

## 2020-11-21 DIAGNOSIS — I5021 Acute systolic (congestive) heart failure: Secondary | ICD-10-CM | POA: Diagnosis not present

## 2020-11-21 DIAGNOSIS — Z7901 Long term (current) use of anticoagulants: Secondary | ICD-10-CM | POA: Insufficient documentation

## 2020-11-21 DIAGNOSIS — Z79899 Other long term (current) drug therapy: Secondary | ICD-10-CM | POA: Diagnosis not present

## 2020-11-21 HISTORY — PX: ATRIAL FIBRILLATION ABLATION: EP1191

## 2020-11-21 SURGERY — ATRIAL FIBRILLATION ABLATION
Anesthesia: General

## 2020-11-21 MED ORDER — FUROSEMIDE 10 MG/ML IJ SOLN
INTRAMUSCULAR | Status: AC
  Filled 2020-11-21: qty 4

## 2020-11-21 MED ORDER — ONDANSETRON HCL 4 MG/2ML IJ SOLN: 4.0000 mg | Freq: Four times a day (QID) | INTRAMUSCULAR | Status: DC | PRN

## 2020-11-21 MED ORDER — PROPOFOL 10 MG/ML IV BOLUS
INTRAVENOUS | Status: DC | PRN
  Administered 2020-11-21: 60 mg via INTRAVENOUS
  Administered 2020-11-21 (×2): 40 mg via INTRAVENOUS

## 2020-11-21 MED ORDER — HEPARIN (PORCINE) IN NACL 1000-0.9 UT/500ML-% IV SOLN
INTRAVENOUS | Status: AC
  Filled 2020-11-21: qty 1500

## 2020-11-21 MED ORDER — PROTAMINE SULFATE 10 MG/ML IV SOLN
INTRAVENOUS | Status: DC | PRN
  Administered 2020-11-21: 5 mg via INTRAVENOUS
  Administered 2020-11-21: 30 mg via INTRAVENOUS

## 2020-11-21 MED ORDER — HEPARIN SODIUM (PORCINE) 1000 UNIT/ML IJ SOLN
INTRAMUSCULAR | Status: DC | PRN
  Administered 2020-11-21: 1000 [IU] via INTRAVENOUS

## 2020-11-21 MED ORDER — SODIUM CHLORIDE 0.9 % IV SOLN: INTRAVENOUS | Status: DC

## 2020-11-21 MED ORDER — ISOPROTERENOL HCL 0.2 MG/ML IJ SOLN
INTRAMUSCULAR | Status: AC
  Filled 2020-11-21: qty 5

## 2020-11-21 MED ORDER — SODIUM CHLORIDE 0.9 % IV SOLN: 250.0000 mL | INTRAVENOUS | Status: DC | PRN

## 2020-11-21 MED ORDER — PANTOPRAZOLE SODIUM 40 MG PO TBEC
40.0000 mg | DELAYED_RELEASE_TABLET | Freq: Every day | ORAL | Status: DC
  Administered 2020-11-21: 40 mg via ORAL
  Filled 2020-11-21: qty 1

## 2020-11-21 MED ORDER — ROCURONIUM BROMIDE 10 MG/ML (PF) SYRINGE
PREFILLED_SYRINGE | INTRAVENOUS | Status: DC | PRN
  Administered 2020-11-21: 60 mg via INTRAVENOUS
  Administered 2020-11-21 (×2): 20 mg via INTRAVENOUS

## 2020-11-21 MED ORDER — PHENYLEPHRINE HCL (PRESSORS) 10 MG/ML IV SOLN
INTRAVENOUS | Status: DC | PRN
  Administered 2020-11-21 (×3): 80 ug via INTRAVENOUS
  Administered 2020-11-21 (×2): 120 ug via INTRAVENOUS

## 2020-11-21 MED ORDER — LACTATED RINGERS IV SOLN: INTRAVENOUS | Status: DC | PRN

## 2020-11-21 MED ORDER — SODIUM CHLORIDE 0.9% FLUSH: 3.0000 mL | Freq: Two times a day (BID) | INTRAVENOUS | Status: DC

## 2020-11-21 MED ORDER — HEPARIN (PORCINE) IN NACL 1000-0.9 UT/500ML-% IV SOLN
INTRAVENOUS | Status: AC
  Filled 2020-11-21: qty 500

## 2020-11-21 MED ORDER — ISOPROTERENOL HCL 0.2 MG/ML IJ SOLN
INTRAVENOUS | Status: DC | PRN
  Administered 2020-11-21: 2 ug/min via INTRAVENOUS

## 2020-11-21 MED ORDER — SODIUM CHLORIDE 0.9 % IV SOLN
INTRAVENOUS | Status: DC | PRN
  Administered 2020-11-21: 50 ug/min via INTRAVENOUS

## 2020-11-21 MED ORDER — HEPARIN SODIUM (PORCINE) 1000 UNIT/ML IJ SOLN
INTRAMUSCULAR | Status: DC | PRN
  Administered 2020-11-21: 2000 [IU] via INTRAVENOUS
  Administered 2020-11-21: 12000 [IU] via INTRAVENOUS
  Administered 2020-11-21: 4000 [IU] via INTRAVENOUS

## 2020-11-21 MED ORDER — APIXABAN 5 MG PO TABS
5.0000 mg | ORAL_TABLET | Freq: Two times a day (BID) | ORAL | Status: DC
  Administered 2020-11-21: 5 mg via ORAL
  Filled 2020-11-21: qty 1

## 2020-11-21 MED ORDER — LIDOCAINE 2% (20 MG/ML) 5 ML SYRINGE
INTRAMUSCULAR | Status: DC | PRN
  Administered 2020-11-21: 60 mg via INTRAVENOUS

## 2020-11-21 MED ORDER — HEPARIN SODIUM (PORCINE) 1000 UNIT/ML IJ SOLN
INTRAMUSCULAR | Status: AC
  Filled 2020-11-21: qty 1

## 2020-11-21 MED ORDER — ONDANSETRON HCL 4 MG/2ML IJ SOLN
INTRAMUSCULAR | Status: DC | PRN
  Administered 2020-11-21: 4 mg via INTRAVENOUS

## 2020-11-21 MED ORDER — FUROSEMIDE 10 MG/ML IJ SOLN
INTRAMUSCULAR | Status: DC | PRN
  Administered 2020-11-21: 20 mg via INTRAMUSCULAR

## 2020-11-21 MED ORDER — ACETAMINOPHEN 325 MG PO TABS
650.0000 mg | ORAL_TABLET | ORAL | Status: DC | PRN
  Filled 2020-11-21: qty 2

## 2020-11-21 MED ORDER — DEXAMETHASONE SODIUM PHOSPHATE 10 MG/ML IJ SOLN
INTRAMUSCULAR | Status: DC | PRN
  Administered 2020-11-21: 5 mg via INTRAVENOUS

## 2020-11-21 MED ORDER — SODIUM CHLORIDE 0.9% FLUSH: 3.0000 mL | INTRAVENOUS | Status: DC | PRN

## 2020-11-21 MED ORDER — FENTANYL CITRATE (PF) 250 MCG/5ML IJ SOLN
INTRAMUSCULAR | Status: DC | PRN
  Administered 2020-11-21: 100 ug via INTRAVENOUS

## 2020-11-21 MED ORDER — SUGAMMADEX SODIUM 200 MG/2ML IV SOLN
INTRAVENOUS | Status: DC | PRN
  Administered 2020-11-21: 200 mg via INTRAVENOUS

## 2020-11-21 MED ORDER — HEPARIN (PORCINE) IN NACL 1000-0.9 UT/500ML-% IV SOLN
INTRAVENOUS | Status: DC | PRN
  Administered 2020-11-21 (×4): 500 mL

## 2020-11-21 SURGICAL SUPPLY — 18 items
CATH OCTARAY 2.0 F 3-3-3-3-3 (CATHETERS) ×1 IMPLANT
CATH S CIRCA THERM PROBE 10F (CATHETERS) ×1 IMPLANT
CATH SMTCH THERMOCOOL SF DF (CATHETERS) ×1 IMPLANT
CATH SOUNDSTAR ECO 8FR (CATHETERS) ×1 IMPLANT
CATH WEB BI DIR CSDF CRV REPRO (CATHETERS) ×1 IMPLANT
CLOSURE PERCLOSE PROSTYLE (VASCULAR PRODUCTS) ×3 IMPLANT
COVER SWIFTLINK CONNECTOR (BAG) ×2 IMPLANT
PACK EP LATEX FREE (CUSTOM PROCEDURE TRAY) ×2
PACK EP LF (CUSTOM PROCEDURE TRAY) ×1 IMPLANT
PAD PRO RADIOLUCENT 2001M-C (PAD) ×2 IMPLANT
PATCH CARTO3 (PAD) ×1 IMPLANT
SHEATH AGILIS NXT 8.5F 71CM (SHEATH) ×1 IMPLANT
SHEATH BAYLIS TRANSSEPTAL 98CM (NEEDLE) ×1 IMPLANT
SHEATH CARTO VIZIGO SM CVD (SHEATH) ×1 IMPLANT
SHEATH PINNACLE 8F 10CM (SHEATH) ×2 IMPLANT
SHEATH PINNACLE 9F 10CM (SHEATH) ×1 IMPLANT
SHEATH PROBE COVER 6X72 (BAG) ×1 IMPLANT
TUBING SMART ABLATE COOLFLOW (TUBING) ×1 IMPLANT

## 2020-11-21 NOTE — Discharge Instructions (Addendum)
Post procedure care instructions No driving for 4 days. No lifting over 5 lbs for 1 week. No vigorous or sexual activity for 1 week. You may return to work/your usual activities on 11/29/20. Keep procedure site clean & dry. If you notice increased pain, swelling, bleeding or pus, call/return!  You may shower after 24 hours, but no soaking in baths/hot tubs/pools for 1 week.   You have an appointment set up with the [facility].  Multiple studies have shown that being followed by a dedicated atrial fibrillation clinic in addition to the standard care you receive from your other physicians improves health. We believe that enrollment in the atrial fibrillation clinic will allow [place] to better care for you.   The phone number to the [facility] is [phone]. The clinic is staffed Monday through Friday from 8:30am to 5pm.  Parking Directions: The clinic is located in the Heart and Vascular Building connected to [facility]. 1)From [address] turn on to [address] and go to the 3rd entrance  (Heart and Vascular entrance) on the right. 2)Look to the right for Heart &Vascular Parking Garage. 3)A code for the entrance is required. For Sept is 4455.   4)Take the elevators to the 1st floor. Registration is in the room with the glass walls at the end of the hallway.  If you have any trouble parking or locating the clinic, please don't hesitate to call [phone].   Cardiac Ablation, Care After  This sheet gives you information about how to care for yourself after your procedure. Your health care provider may also give you more specific instructions. If you have problems or questions, contact your health care provider. What can I expect after the procedure? After the procedure, it is common to have: Bruising around your puncture site. Tenderness around your puncture site. Skipped heartbeats. Tiredness (fatigue).  Follow these instructions at home: Puncture  site care  Follow instructions from your health care provider about how to take care of your puncture site. Make sure you: If present, leave stitches (sutures), skin glue, or adhesive strips in place. These skin closures may need to stay in place for up to 2 weeks. If adhesive strip edges start to loosen and curl up, you may trim the loose edges. Do not remove adhesive strips completely unless your health care provider tells you to do that. If a large square bandage is present, this may be removed 24 hours after surgery.  Check your puncture site every day for signs of infection. Check for: Redness, swelling, or pain. Fluid or blood. If your puncture site starts to bleed, lie down on your back, apply firm pressure to the area, and contact your health care provider. Warmth. Pus or a bad smell. Driving Do not drive for at least 4 days after your procedure or however long your health care provider recommends. (Do not resume driving if you have previously been instructed not to drive for other health reasons.) Do not drive or use heavy machinery while taking prescription pain medicine. Activity Avoid activities that take a lot of effort for at least 7 days after your procedure. Do not lift anything that is heavier than 5 lb (4.5 kg) for one week.  No sexual activity for 1 week.  Return to your normal activities as told by your health care provider. Ask your health care provider what activities are safe for you. General instructions Take over-the-counter and prescription medicines only as told by your health care provider.  Do not use any products that contain nicotine or tobacco, such as cigarettes and e-cigarettes. If you need help quitting, ask your health care provider. You may shower after 24 hours, but Do not take baths, swim, or use a hot tub for 1 week.  Do not drink alcohol for 24 hours after your procedure. Keep all follow-up visits as told by your health care provider. This is  important. Contact a health care provider if: You have redness, mild swelling, or pain around your puncture site. You have fluid or blood coming from your puncture site that stops after applying firm pressure to the area. Your puncture site feels warm to the touch. You have pus or a bad smell coming from your puncture site. You have a fever. You have chest pain or discomfort that spreads to your neck, jaw, or arm. You are sweating a lot. You feel nauseous. You have a fast or irregular heartbeat. You have shortness of breath. You are dizzy or light-headed and feel the need to lie down. You have pain or numbness in the arm or leg closest to your puncture site. Get help right away if: Your puncture site suddenly swells. Your puncture site is bleeding and the bleeding does not stop after applying firm pressure to the area. These symptoms may represent a serious problem that is an emergency. Do not wait to see if the symptoms will go away. Get medical help right away. Call your local emergency services (911 in the [place] Do not drive yourself to the hospital. Summary After the procedure, it is normal to have bruising and tenderness at the puncture site in your groin, neck, or forearm. Check your puncture site every day for signs of infection. Get help right away if your puncture site is bleeding and the bleeding does not stop after applying firm pressure to the area. This is a medical emergency. This information is not intended to replace advice given to you by your health care provider. Make sure you discuss any questions you have with your health care provider.

## 2020-11-21 NOTE — Telephone Encounter (Signed)
Per Dr. Epifanio Lesches Eliquis tonight at 11:00 pm.  Do not take any other medications-get back to her routine medication tomorrow morning.  Pt aware.  Thanked nurse for instruction.

## 2020-11-21 NOTE — Anesthesia Procedure Notes (Signed)
Procedure Name: Intubation Date/Time: 11/21/2020 7:45 AM Performed by: Glynda Jaeger, CRNA Pre-anesthesia Checklist: Patient identified, Patient being monitored, Timeout performed, Emergency Drugs available and Suction available Patient Re-evaluated:Patient Re-evaluated prior to induction Oxygen Delivery Method: Circle System Utilized Preoxygenation: Pre-oxygenation with 100% oxygen Induction Type: IV induction Ventilation: Mask ventilation without difficulty Laryngoscope Size: 3 and Glidescope Grade View: Grade I Tube type: Oral Tube size: 7.5 mm Number of attempts: 1 Airway Equipment and Method: Stylet Placement Confirmation: ETT inserted through vocal cords under direct vision, positive ETCO2 and breath sounds checked- equal and bilateral Secured at: 21 cm Tube secured with: Tape Dental Injury: Teeth and Oropharynx as per pre-operative assessment  Difficulty Due To: Difficult Airway- due to anterior larynx Comments: DL x 1 with Mac 4, grade 3 view. DL x 1 with Miller 2, grade 3 view. Grade 1 view with glidescope go.

## 2020-11-21 NOTE — Anesthesia Postprocedure Evaluation (Signed)
Anesthesia Post Note  Patient: Shelly Bowers  Procedure(s) Performed: ATRIAL FIBRILLATION ABLATION     Patient location during evaluation: PACU Anesthesia Type: General Level of consciousness: awake and alert Pain management: pain level controlled Vital Signs Assessment: post-procedure vital signs reviewed and stable Respiratory status: spontaneous breathing, nonlabored ventilation and respiratory function stable Cardiovascular status: stable and blood pressure returned to baseline Anesthetic complications: no   No notable events documented.  Last Vitals:  Vitals:   11/21/20 1145 11/21/20 1200  BP: (!) 93/56 (!) 98/53  Pulse: 66 66  Resp: 10 12  Temp:    SpO2: 95% 95%    Last Pain:  Vitals:   11/21/20 1134  TempSrc:   PainSc: 0-No pain                 Audry Pili

## 2020-11-21 NOTE — Telephone Encounter (Signed)
Patient would like to know what medications she needs to take tonight before she goes to bed. She talked to DR. Quentin Ore today about them but they did not end up on her discharge papers.   Please leave a detailed message if the patient is not home.

## 2020-11-21 NOTE — Transfer of Care (Signed)
Immediate Anesthesia Transfer of Care Note  Patient: Shelly Bowers  Procedure(s) Performed: ATRIAL FIBRILLATION ABLATION  Patient Location: cath lab  Anesthesia Type:General  Level of Consciousness: awake, alert  and oriented  Airway & Oxygen Therapy: Patient Spontanous Breathing and Patient connected to nasal cannula oxygen  Post-op Assessment: Report given to RN and Post -op Vital signs reviewed and stable  Post vital signs: Reviewed and stable  Last Vitals:  Vitals Value Taken Time  BP 102/53 11/21/20 1048  Temp 36.3 C 11/21/20 1046  Pulse 72 11/21/20 1050  Resp 13 11/21/20 1050  SpO2 96 % 11/21/20 1050  Vitals shown include unvalidated device data.  Last Pain:  Vitals:   11/21/20 1046  TempSrc: Other (Comment)  PainSc: 0-No pain         Complications: No notable events documented.

## 2020-11-21 NOTE — Progress Notes (Signed)
Pt ambulated without difficulty or bleeding.   Discharged home with her husband who will drive and stay with pt x 24 hrs. 

## 2020-11-21 NOTE — Interval H&P Note (Signed)
History and Physical Interval Note:  11/21/2020 7:09 AM  Shelly Bowers  has presented today for surgery, with the diagnosis of afib.  The various methods of treatment have been discussed with the patient and family. After consideration of risks, benefits and other options for treatment, the patient has consented to  Procedure(s): ATRIAL FIBRILLATION ABLATION (N/A) as a surgical intervention.  The patient's history has been reviewed, patient examined, no change in status, stable for surgery.  I have reviewed the patient's chart and labs.  Questions were answered to the patient's satisfaction.     [name] [name]

## 2020-11-22 ENCOUNTER — Encounter (HOSPITAL_COMMUNITY): Payer: Self-pay | Admitting: Cardiology

## 2020-11-22 LAB — POCT ACTIVATED CLOTTING TIME
Activated Clotting Time: 329 seconds
Activated Clotting Time: 387 seconds
Activated Clotting Time: 283 seconds
Activated Clotting Time: 335 seconds

## 2020-11-23 ENCOUNTER — Telehealth: Payer: Self-pay | Admitting: Neurology

## 2020-11-23 NOTE — Telephone Encounter (Signed)
LVM for pt to call me back to schedule sleep study  

## 2020-11-24 NOTE — Telephone Encounter (Signed)
Pt returned call. Please call back when available. 

## 2020-11-24 NOTE — Telephone Encounter (Signed)
Pt c/o medication issue:  1. Name of Medication: Protonix   2. How are you currently taking this medication (dosage and times per day)? Not currently taking   3. Are you having a reaction (difficulty breathing--STAT)? No   4. What is your medication issue? Shelly Bowers is calling wanting to speak with Shelly Bowers about Protonix. She does not currently have the medication nor does the pharmacy. She states Dr. Quentin Ore advised her he wanted her to take this medication prior to being discharged, but never received any further information on it.

## 2020-11-25 MED ORDER — PANTOPRAZOLE SODIUM 40 MG PO TBEC
40.0000 mg | DELAYED_RELEASE_TABLET | Freq: Every day | ORAL | 0 refills | Status: DC
Start: 2020-11-25 — End: 2021-01-09

## 2020-11-25 NOTE — Addendum Note (Signed)
Addended by: Willeen Cass A on: 11/25/2020 07:43 AM   Modules accepted: Orders

## 2020-11-25 NOTE — Telephone Encounter (Signed)
Protonix sent to pharmacy  [employer] message

## 2020-11-29 ENCOUNTER — Telehealth (HOSPITAL_COMMUNITY): Payer: Self-pay | Admitting: *Deleted

## 2020-11-29 NOTE — Telephone Encounter (Signed)
Patient called in stating she noticed yesterday afternoon via her apple watch her HR was flucuating between 51-149 her HR at 11am was 121. She is currently out of town at [employer] this week and was concerned her ablation had failed. Reassured pt 3 months post ablation we expect breakthrough afib. After taking her morning medications her HR was in the 70s. She will continue to monitor and notify if her HR is consistently over 100. If still out of rhythm once she returns to town she will let the office know.

## 2020-11-30 ENCOUNTER — Telehealth: Payer: Self-pay | Admitting: Neurology

## 2020-11-30 NOTE — Telephone Encounter (Signed)
Pt left voicemail to cancel & reschedule HST that is scheduled for 10/3 - called patient back and LVM for her to call back to reschedule. Cancelled 10/3 appt per request of patient and waiting for call back to reschedule.

## 2020-12-12 ENCOUNTER — Ambulatory Visit (INDEPENDENT_AMBULATORY_CARE_PROVIDER_SITE_OTHER): Payer: Medicare HMO | Admitting: Neurology

## 2020-12-12 DIAGNOSIS — I420 Dilated cardiomyopathy: Secondary | ICD-10-CM

## 2020-12-12 DIAGNOSIS — G4733 Obstructive sleep apnea (adult) (pediatric): Secondary | ICD-10-CM

## 2020-12-12 DIAGNOSIS — R0683 Snoring: Secondary | ICD-10-CM

## 2020-12-12 DIAGNOSIS — R351 Nocturia: Secondary | ICD-10-CM

## 2020-12-12 DIAGNOSIS — I5021 Acute systolic (congestive) heart failure: Secondary | ICD-10-CM

## 2020-12-12 DIAGNOSIS — I4891 Unspecified atrial fibrillation: Secondary | ICD-10-CM

## 2020-12-19 ENCOUNTER — Other Ambulatory Visit: Payer: Self-pay

## 2020-12-19 ENCOUNTER — Ambulatory Visit (HOSPITAL_COMMUNITY)
Admission: RE | Admit: 2020-12-19 | Discharge: 2020-12-19 | Disposition: A | Discharge status: Home / Self Care | Payer: Medicare HMO | Source: Ambulatory Visit | Attending: Physician Assistant | Admitting: Physician Assistant

## 2020-12-19 ENCOUNTER — Encounter (HOSPITAL_COMMUNITY): Payer: Self-pay | Admitting: Physician Assistant

## 2020-12-19 VITALS — BP 118/84 | HR 107 | Ht 66.5 in | Wt 176.2 lb

## 2020-12-19 DIAGNOSIS — I4819 Other persistent atrial fibrillation: Secondary | ICD-10-CM | POA: Diagnosis not present

## 2020-12-19 DIAGNOSIS — I5022 Chronic systolic (congestive) heart failure: Secondary | ICD-10-CM | POA: Insufficient documentation

## 2020-12-19 DIAGNOSIS — Z7901 Long term (current) use of anticoagulants: Secondary | ICD-10-CM | POA: Insufficient documentation

## 2020-12-19 DIAGNOSIS — Z79899 Other long term (current) drug therapy: Secondary | ICD-10-CM | POA: Insufficient documentation

## 2020-12-19 DIAGNOSIS — Z7951 Long term (current) use of inhaled steroids: Secondary | ICD-10-CM | POA: Insufficient documentation

## 2020-12-19 DIAGNOSIS — D6869 Other thrombophilia: Secondary | ICD-10-CM | POA: Diagnosis not present

## 2020-12-19 DIAGNOSIS — I110 Hypertensive heart disease with heart failure: Secondary | ICD-10-CM | POA: Insufficient documentation

## 2020-12-19 LAB — CBC
HCT: 38.5 % (ref 36.0–46.0)
Hemoglobin: 12.7 g/dL (ref 12.0–15.0)
MCH: 33.6 pg (ref 26.0–34.0)
MCHC: 33 g/dL (ref 30.0–36.0)
MCV: 101.9 fL — ABNORMAL HIGH (ref 80.0–100.0)
Platelets: 182 10*3/uL (ref 150–400)
RBC: 3.78 MIL/uL — ABNORMAL LOW (ref 3.87–5.11)
RDW: 14.4 % (ref 11.5–15.5)
WBC: 5.5 10*3/uL (ref 4.0–10.5)
nRBC: 0 % (ref 0.0–0.2)

## 2020-12-19 LAB — BASIC METABOLIC PANEL
Anion gap: 8 (ref 5–15)
BUN: 11 mg/dL (ref 8–23)
CO2: 27 mmol/L (ref 22–32)
Calcium: 9.5 mg/dL (ref 8.9–10.3)
Chloride: 103 mmol/L (ref 98–111)
Creatinine, Ser: 0.84 mg/dL (ref 0.44–1.00)
GFR, Estimated: >60 mL/min (ref >60)
Glucose, Bld: 123 mg/dL — ABNORMAL HIGH (ref 70–99)
Potassium: 4.3 mmol/L (ref 3.5–5.1)
Sodium: 138 mmol/L (ref 135–145)

## 2020-12-19 NOTE — H&P (View-Only) (Signed)
Primary Care Physician: Isaac Bliss, Rayford Halsted, MD Primary Cardiologist: Dr Percival Spanish  Primary Electrophysiologist: Dr Quentin Ore Referring Physician: Dr [name] is a 70 y.o. female with a history of HTN, chronic systolic CHF, atrial fibrillation who presents for follow up in the [facility]. Patient is on Eliquis for a CHADS2VASC score of 4. She is s/p afib ablation 11/21/20 with Dr Quentin Ore. Patient called the clinic 11/29/20 to report labile heart rates associated with some fatigue. She now appears persistently in afib. She denies CP, swallowing pain, or groin issues.   Today, she denies symptoms of palpitations, chest pain, shortness of breath, orthopnea, PND, lower extremity edema, dizziness, presyncope, syncope, snoring, daytime somnolence, bleeding, or neurologic sequela. The patient is tolerating medications without difficulties and is otherwise without complaint today.    Atrial Fibrillation Risk Factors:  she does not have symptoms or diagnosis of sleep apnea. she does not have a history of rheumatic fever.   she has a BMI of Body mass index is 28.01 kg/m.Marland Kitchen Filed Weights   12/19/20 1410  Weight: 79.9 kg    Family History  Problem Relation Age of Onset   Breast cancer Mother 71   Atrial fibrillation Father      Atrial Fibrillation Management history:  Previous antiarrhythmic drugs: none Previous cardioversions: 08/25/20 Previous ablations: 11/21/20 CHADS2VASC score: 4 Anticoagulation history: Eliquis   Past Medical History:  Diagnosis Date   Atrial fibrillation ([place])    Cardiomyopathy ([place])    HTN (hypertension)    Hyperlipidemia    Osteopenia    Past Surgical History:  Procedure Laterality Date   ATRIAL FIBRILLATION ABLATION N/A 11/21/2020   Procedure: ATRIAL FIBRILLATION ABLATION;  Surgeon: Vickie Epley, MD;  Location: [facility];  Service: Cardiovascular;  Laterality: N/A;   AUGMENTATION  MAMMAPLASTY Bilateral    BREAST EXCISIONAL BIOPSY Left    BUBBLE STUDY  08/25/2020   Procedure: BUBBLE STUDY;  Surgeon: Werner Lean, MD;  Location: [place];  Service: Cardiovascular;;   CARDIOVERSION N/A 08/25/2020   Procedure: CARDIOVERSION;  Surgeon: Werner Lean, MD;  Location: [place];  Service: Cardiovascular;  Laterality: N/A;   INCISION AND DRAINAGE ABSCESS Left 12/27/2015   Procedure: INCISION AND DRAINAGE ABSCESS;  Surgeon: Daryll Brod, MD;  Location: [place];  Service: Orthopedics;  Laterality: Left;   TEE WITHOUT CARDIOVERSION N/A 08/25/2020   Procedure: TRANSESOPHAGEAL ECHOCARDIOGRAM (TEE);  Surgeon: Werner Lean, MD;  Location: [facility];  Service: Cardiovascular;  Laterality: N/A;    Current Outpatient Medications  Medication Sig Dispense Refill   apixaban (ELIQUIS) 5 MG TABS tablet Take 1 tablet (5 mg total) by mouth 2 (two) times daily. 180 tablet 3   atorvastatin (LIPITOR) 10 MG tablet TAKE ONE TABLET BY MOUTH ONE TIME DAILY 90 tablet 1   celecoxib (CELEBREX) 200 MG capsule TAKE 1 CAPSULE BY MOUTH 2 TIMES DAILY AS NEEDED 90 capsule 1   cetirizine (ZYRTEC) 10 MG tablet Take 10 mg by mouth daily.     Cyanocobalamin (B-12 PO) Take 1,000 mcg by mouth daily.     digoxin (LANOXIN) 0.125 MG tablet Take 1 tablet (0.125 mg total) by mouth daily. 90 tablet 3   folic acid (FOLVITE) 254 MCG tablet Take 800 mcg by mouth daily.     furosemide (LASIX) 20 MG tablet TAKE 1 TABLET (20 MG TOTAL) BY MOUTH DAILY AS NEEDED FOR EDEMA (WEIGHT GAIN). 30 tablet 5   ibuprofen (  ADVIL,MOTRIN) 200 MG tablet Take 400 mg by mouth every 6 (six) hours as needed for headache, fever or mild pain.     metoprolol succinate (TOPROL XL) 25 MG 24 hr tablet Take 1 tablet (25 mg total) by mouth 2 (two) times daily. 60 tablet 11   metroNIDAZOLE (METROGEL) 0.75 % gel Apply 1 application topically at bedtime.     naphazoline-pheniramine (VISINE) 0.025-0.3 %  ophthalmic solution Place 1 drop into both eyes daily as needed for eye irritation.     pantoprazole (PROTONIX) 40 MG tablet Take 1 tablet (40 mg total) by mouth daily. 45 tablet 0   sacubitril-valsartan (ENTRESTO) 24-26 MG Take 1 tablet by mouth 2 (two) times daily. 180 tablet 3   triamcinolone (NASACORT) 55 MCG/ACT AERO nasal inhaler Place 2 sprays into the nose 2 (two) times daily as needed (Congestion).     vitamin E 1000 UNIT capsule Take 1,000 Units by mouth daily.     No current facility-administered medications for this encounter.    Allergies  Allergen Reactions   Codeine Nausea Only   Codeine Sulfate     REACTION: unspecified   Hydrocodone Nausea And Vomiting    Social History   Socioeconomic History   Marital status: Married    Spouse name: Not on file   Number of children: Not on file   Years of education: Not on file   Highest education level: Not on file  Occupational History   Not on file  Tobacco Use   Smoking status: Never   Smokeless tobacco: Never  Substance and Sexual Activity   Alcohol use: Yes    Alcohol/week: 14.0 standard drinks    Types: 14 Glasses of wine per week    Comment: 1 glass of wine nightly   Drug use: No   Sexual activity: Not on file  Other Topics Concern   Not on file  Social History Narrative   Left handed   Caffeine use: tea sometimes   Soda-zero sugar/no caffeine ginger ale   1-2 cups coffee per day (decaf)   Social Determinants of Health   Financial Resource Strain: Not on file  Food Insecurity: Not on file  Transportation Needs: Not on file  Physical Activity: Not on file  Stress: Not on file  Social Connections: Not on file  Intimate Partner Violence: Not on file     ROS- All systems are reviewed and negative except as per the HPI above.  Physical Exam: Vitals:   12/19/20 1410  BP: 118/84  Pulse: (!) 107  Weight: 79.9 kg  Height: 5' 6.5" (1.689 m)    GEN- The patient is a well appearing female, alert and  oriented x 3 today.   Head- normocephalic, atraumatic Eyes-  Sclera clear, conjunctiva pink Ears- hearing intact Oropharynx- clear Neck- supple  Lungs- Clear to ausculation bilaterally, normal work of breathing Heart- irregular rate and rhythm, no murmurs, rubs or gallops  GI- soft, NT, ND, + BS Extremities- no clubbing, cyanosis, or edema MS- no significant deformity or atrophy Skin- no rash or lesion Psych- euthymic mood, full affect Neuro- strength and sensation are intact  Wt Readings from Last 3 Encounters:  12/19/20 79.9 kg  11/21/20 78.9 kg  11/08/20 80.1 kg    EKG today demonstrates  Afib  Vent. rate 107 BPM PR interval * ms QRS duration 86 ms QT/QTcB 336/448 ms  Echo 08/24/20 demonstrated   1. Left ventricular ejection fraction, by estimation, is 30 to 35%. The left ventricle has  moderately decreased function. The left ventricle demonstrates global hypokinesis. There is mild left ventricular hypertrophy. Left ventricular diastolic parameters are indeterminate.   2. Right ventricular systolic function is mildly reduced. The right  ventricular size is mildly enlarged. Tricuspid regurgitation signal is  inadequate for assessing PA pressure.   3. Left atrial size was mild to moderately dilated.   4. Right atrial size was mildly dilated.   5. The mitral valve is degenerative. Mild mitral valve regurgitation. No evidence of mitral stenosis.   6. The aortic valve is grossly normal. Aortic valve regurgitation is not visualized. No aortic stenosis is present.   7. The inferior vena cava is dilated in size with <50% respiratory  variability, suggesting right atrial pressure of 15 mmHg.   Epic records are reviewed at length today  CHA2DS2-VASc Score = 4  The patient's score is based upon: CHF History: 1 HTN History: 1 Diabetes History: 0 Stroke History: 0 Vascular Disease History: 0 Age Score: 1 Gender Score: 1       ASSESSMENT AND PLAN: 1. Persistent Atrial  Fibrillation (ICD10:  I48.19) The patient's CHA2DS2-VASc score is 4, indicating a 4.8% annual risk of stroke.   S/p afib ablation 11/21/20 Patient appears persistently in afib. We discussed therapeutic options. Will plan for DCCV. Check bmet/cbc. Continue Toprol 25 mg BID Continue digoxin 0.125 mg daily Continue Eliquis 5 mg BID with no missed doses for 3 months post ablation.   2. Secondary Hypercoagulable State (ICD10:  D68.69) The patient is at significant risk for stroke/thromboembolism based upon her CHA2DS2-VASc Score of 4.  Continue Apixaban (Eliquis).   3. HTN Stable, no changes today.  4. Chronic systolic CHF EF 92-11% No signs or symptoms of fluid overload.   Follow up in the AF clinic post DCCV.    [facility] [address] [place], [place] [address] [phone] 12/19/2020 4:34 PM

## 2020-12-19 NOTE — Progress Notes (Signed)
[facility] TEST REPORT ( by Watch PAT)   STUDY DATE:  12-12-2020 DOB:  23-Dec-1950    ORDERING CLINICIAN: Larey Seat, MD  REFERRING CLINICIAN: Lelon Frohlich, MD   CLINICAL INFORMATION/HISTORY: 11/07/2020 Fairly recent onset of atrial fibrillation.   She has a history of hypertension hyperlipidemia and is overweight.  On Memorial Day, 19 Aug 2020, she developed shortness of breath and a cough and was concerned that she may have contracted COVID-19 , had a rattling cough at night, felt miserable. On Tuesday ,following that weekend, she scheduled a visit [facility].  While there, she became short of breath again and her heart rate reached 170 bpm. She was sent to the hospital directly for evaluation and was found to be in new onset atrial fibrillation with rapid ventricular response.   She was admitted on 5-31 and discharged. 4 days later she had a transesophageal echo DCCV on 3 June was found to have a new heart failure with reduced ejection fraction of 30 to 35% by echocardiogram.-this was a diastolic heart failure related to atrial fibrillation.  She was started on metoprolol 25 mg for rate control.  She was also anticoagulated on Eliquis.  After hospital discharge her apple watch has alarmed her several times to have had rapid heart rates and the on-call cardiologist recommended increasing the metoprolol to 25 twice daily.    Epworth sleepiness score: 7/24.   BMI: 29.4 kg/m   Neck Circumference: 15"   FINDINGS:   Sleep Summary: The total recording time amounted to 9 hours and 36 minutes of which 8 hours and 11 minutes were considered total sleep time.  Percentage of REM was 17.4% during sleep time.                                 Respiratory Indices:   The calculated overall apnea-hypopnea index was 32.9/h and was only slightly elevated during REM sleep.  The RDI was higher than the AHI indicating additional arousals from  snoring.  17 central apneas were also noted.  During sleep in supine position there was a significant increase in apnea with an AHI of 48.3/h versus a non-REM supine sleep position AHI of 20.5/h.  Snoring level was highly elevated the mean volume was 44 dB and the patient snored for 47% of the total sleep time.                                                      Oxygen Saturation Statistics:   O2 Saturation Range (%): Oxygen saturation varied between a nadir of 82% and a maximum of 100% with a mean oxygenation of 92%.  O2 Saturation (minutes) <89%:  Total time in hypoxia was 28.9 minutes.  This was the equivalent of 5.9% of total sleep time.                                             Pulse Rate Statistics:    Pulse Range: Varied between a heart rate of 60 and a maximum of 126 bpm with a mean heart rate  of 93 bpm.                 IMPRESSION:  This HST confirms the presence of obstructive sleep apnea, non-REM sleep dependent associated with brief but not infrequent hypoxia events.  Supine AHI was higher than the nonsupine AHI which would lead to a recommendation to avoid sleeping on her back and instead moved to the left or right side.   RECOMMENDATION: Given the history of atrial fibrillation treatment by CPAP would be necessary as neither positional component no weight loss will completely reduce this patient's apnea risk.  I will write for an auto titration CPAP device with a pressure range between 7 and 17 cmH2O pressure to centimeter EPR, heated humidification, and a mask or interface of the patient's choice.  Please offer her a nasal pillow or cradle and fit the patient in person while in reclined position.    INTERPRETING PHYSICIAN:   Larey Seat, MD   Medical Director of [facility].

## 2020-12-19 NOTE — Patient Instructions (Signed)
Cardioversion scheduled for Monday, October 3rd  - Arrive at the [facility] and go to admitting at 930AM  - Do not eat or drink anything after midnight the night prior to your procedure.  - Take all your morning medication (except diabetic medications) with a sip of water prior to arrival.  - You will not be able to drive home after your procedure.  - Do NOT miss any doses of your blood thinner - if you should miss a dose please notify our office immediately.  - If you feel as if you go back into normal rhythm prior to scheduled cardioversion, please notify our office immediately. If your procedure is canceled in the cardioversion suite you will be charged a cancellation fee.   Patients will be asked to: to mask in public and hand hygiene (no longer quarantine) in the 3 days prior to surgery, to report if any COVID-19-like illness or household contacts to COVID-19 to determine need for testing

## 2020-12-19 NOTE — Progress Notes (Signed)
Primary Care Physician: Isaac Bliss, Rayford Halsted, MD Primary Cardiologist: Dr Percival Spanish  Primary Electrophysiologist: Dr Quentin Ore Referring Physician: Dr [name] is a 70 y.o. female with a history of HTN, chronic systolic CHF, atrial fibrillation who presents for follow up in the [facility]. Patient is on Eliquis for a CHADS2VASC score of 4. She is s/p afib ablation 11/21/20 with Dr Quentin Ore. Patient called the clinic 11/29/20 to report labile heart rates associated with some fatigue. She now appears persistently in afib. She denies CP, swallowing pain, or groin issues.   Today, she denies symptoms of palpitations, chest pain, shortness of breath, orthopnea, PND, lower extremity edema, dizziness, presyncope, syncope, snoring, daytime somnolence, bleeding, or neurologic sequela. The patient is tolerating medications without difficulties and is otherwise without complaint today.    Atrial Fibrillation Risk Factors:  she does not have symptoms or diagnosis of sleep apnea. she does not have a history of rheumatic fever.   she has a BMI of Body mass index is 28.01 kg/m.Marland Kitchen Filed Weights   12/19/20 1410  Weight: 79.9 kg    Family History  Problem Relation Age of Onset   Breast cancer Mother 59   Atrial fibrillation Father      Atrial Fibrillation Management history:  Previous antiarrhythmic drugs: none Previous cardioversions: 08/25/20 Previous ablations: 11/21/20 CHADS2VASC score: 4 Anticoagulation history: Eliquis   Past Medical History:  Diagnosis Date   Atrial fibrillation ([place])    Cardiomyopathy ([place])    HTN (hypertension)    Hyperlipidemia    Osteopenia    Past Surgical History:  Procedure Laterality Date   ATRIAL FIBRILLATION ABLATION N/A 11/21/2020   Procedure: ATRIAL FIBRILLATION ABLATION;  Surgeon: Vickie Epley, MD;  Location: [facility];  Service: Cardiovascular;  Laterality: N/A;   AUGMENTATION  MAMMAPLASTY Bilateral    BREAST EXCISIONAL BIOPSY Left    BUBBLE STUDY  08/25/2020   Procedure: BUBBLE STUDY;  Surgeon: Werner Lean, MD;  Location: [facility];  Service: Cardiovascular;;   CARDIOVERSION N/A 08/25/2020   Procedure: CARDIOVERSION;  Surgeon: Werner Lean, MD;  Location: [facility];  Service: Cardiovascular;  Laterality: N/A;   INCISION AND DRAINAGE ABSCESS Left 12/27/2015   Procedure: INCISION AND DRAINAGE ABSCESS;  Surgeon: Daryll Brod, MD;  Location: [facility];  Service: Orthopedics;  Laterality: Left;   TEE WITHOUT CARDIOVERSION N/A 08/25/2020   Procedure: TRANSESOPHAGEAL ECHOCARDIOGRAM (TEE);  Surgeon: Werner Lean, MD;  Location: [facility];  Service: Cardiovascular;  Laterality: N/A;    Current Outpatient Medications  Medication Sig Dispense Refill   apixaban (ELIQUIS) 5 MG TABS tablet Take 1 tablet (5 mg total) by mouth 2 (two) times daily. 180 tablet 3   atorvastatin (LIPITOR) 10 MG tablet TAKE ONE TABLET BY MOUTH ONE TIME DAILY 90 tablet 1   celecoxib (CELEBREX) 200 MG capsule TAKE 1 CAPSULE BY MOUTH 2 TIMES DAILY AS NEEDED 90 capsule 1   cetirizine (ZYRTEC) 10 MG tablet Take 10 mg by mouth daily.     Cyanocobalamin (B-12 PO) Take 1,000 mcg by mouth daily.     digoxin (LANOXIN) 0.125 MG tablet Take 1 tablet (0.125 mg total) by mouth daily. 90 tablet 3   folic acid (FOLVITE) 546 MCG tablet Take 800 mcg by mouth daily.     furosemide (LASIX) 20 MG tablet TAKE 1 TABLET (20 MG TOTAL) BY MOUTH DAILY AS NEEDED FOR EDEMA (WEIGHT GAIN). 30 tablet 5   ibuprofen (  ADVIL,MOTRIN) 200 MG tablet Take 400 mg by mouth every 6 (six) hours as needed for headache, fever or mild pain.     metoprolol succinate (TOPROL XL) 25 MG 24 hr tablet Take 1 tablet (25 mg total) by mouth 2 (two) times daily. 60 tablet 11   metroNIDAZOLE (METROGEL) 0.75 % gel Apply 1 application topically at bedtime.     naphazoline-pheniramine (VISINE) 0.025-0.3 %  ophthalmic solution Place 1 drop into both eyes daily as needed for eye irritation.     pantoprazole (PROTONIX) 40 MG tablet Take 1 tablet (40 mg total) by mouth daily. 45 tablet 0   sacubitril-valsartan (ENTRESTO) 24-26 MG Take 1 tablet by mouth 2 (two) times daily. 180 tablet 3   triamcinolone (NASACORT) 55 MCG/ACT AERO nasal inhaler Place 2 sprays into the nose 2 (two) times daily as needed (Congestion).     vitamin E 1000 UNIT capsule Take 1,000 Units by mouth daily.     No current facility-administered medications for this encounter.    Allergies  Allergen Reactions   Codeine Nausea Only   Codeine Sulfate     REACTION: unspecified   Hydrocodone Nausea And Vomiting    Social History   Socioeconomic History   Marital status: Married    Spouse name: Not on file   Number of children: Not on file   Years of education: Not on file   Highest education level: Not on file  Occupational History   Not on file  Tobacco Use   Smoking status: Never   Smokeless tobacco: Never  Substance and Sexual Activity   Alcohol use: Yes    Alcohol/week: 14.0 standard drinks    Types: 14 Glasses of wine per week    Comment: 1 glass of wine nightly   Drug use: No   Sexual activity: Not on file  Other Topics Concern   Not on file  Social History Narrative   Left handed   Caffeine use: tea sometimes   Soda-zero sugar/no caffeine ginger ale   1-2 cups coffee per day (decaf)   Social Determinants of Health   Financial Resource Strain: Not on file  Food Insecurity: Not on file  Transportation Needs: Not on file  Physical Activity: Not on file  Stress: Not on file  Social Connections: Not on file  Intimate Partner Violence: Not on file     ROS- All systems are reviewed and negative except as per the HPI above.  Physical Exam: Vitals:   12/19/20 1410  BP: 118/84  Pulse: (!) 107  Weight: 79.9 kg  Height: 5' 6.5" (1.689 m)    GEN- The patient is a well appearing female, alert and  oriented x 3 today.   Head- normocephalic, atraumatic Eyes-  Sclera clear, conjunctiva pink Ears- hearing intact Oropharynx- clear Neck- supple  Lungs- Clear to ausculation bilaterally, normal work of breathing Heart- irregular rate and rhythm, no murmurs, rubs or gallops  GI- soft, NT, ND, + BS Extremities- no clubbing, cyanosis, or edema MS- no significant deformity or atrophy Skin- no rash or lesion Psych- euthymic mood, full affect Neuro- strength and sensation are intact  Wt Readings from Last 3 Encounters:  12/19/20 79.9 kg  11/21/20 78.9 kg  11/08/20 80.1 kg    EKG today demonstrates  Afib  Vent. rate 107 BPM PR interval * ms QRS duration 86 ms QT/QTcB 336/448 ms  Echo 08/24/20 demonstrated   1. Left ventricular ejection fraction, by estimation, is 30 to 35%. The left ventricle has  moderately decreased function. The left ventricle demonstrates global hypokinesis. There is mild left ventricular hypertrophy. Left ventricular diastolic parameters are indeterminate.   2. Right ventricular systolic function is mildly reduced. The right  ventricular size is mildly enlarged. Tricuspid regurgitation signal is  inadequate for assessing PA pressure.   3. Left atrial size was mild to moderately dilated.   4. Right atrial size was mildly dilated.   5. The mitral valve is degenerative. Mild mitral valve regurgitation. No evidence of mitral stenosis.   6. The aortic valve is grossly normal. Aortic valve regurgitation is not visualized. No aortic stenosis is present.   7. The inferior vena cava is dilated in size with <50% respiratory  variability, suggesting right atrial pressure of 15 mmHg.   Epic records are reviewed at length today  CHA2DS2-VASc Score = 4  The patient's score is based upon: CHF History: 1 HTN History: 1 Diabetes History: 0 Stroke History: 0 Vascular Disease History: 0 Age Score: 1 Gender Score: 1       ASSESSMENT AND PLAN: 1. Persistent Atrial  Fibrillation (ICD10:  I48.19) The patient's CHA2DS2-VASc score is 4, indicating a 4.8% annual risk of stroke.   S/p afib ablation 11/21/20 Patient appears persistently in afib. We discussed therapeutic options. Will plan for DCCV. Check bmet/cbc. Continue Toprol 25 mg BID Continue digoxin 0.125 mg daily Continue Eliquis 5 mg BID with no missed doses for 3 months post ablation.   2. Secondary Hypercoagulable State (ICD10:  D68.69) The patient is at significant risk for stroke/thromboembolism based upon her CHA2DS2-VASc Score of 4.  Continue Apixaban (Eliquis).   3. HTN Stable, no changes today.  4. Chronic systolic CHF EF 02-77% No signs or symptoms of fluid overload.   Follow up in the AF clinic post DCCV.    [facility] [address] [place], [place] [address] [phone] 12/19/2020 4:34 PM

## 2020-12-26 ENCOUNTER — Encounter (HOSPITAL_COMMUNITY)
Admission: RE | Disposition: A | Discharge status: Home / Self Care | Payer: Self-pay | Source: Ambulatory Visit | Attending: Internal Medicine

## 2020-12-26 ENCOUNTER — Encounter (HOSPITAL_COMMUNITY): Payer: Self-pay | Admitting: Internal Medicine

## 2020-12-26 ENCOUNTER — Ambulatory Visit (HOSPITAL_COMMUNITY)
Admission: RE | Admit: 2020-12-26 | Discharge: 2020-12-26 | Disposition: A | Discharge status: Home / Self Care | Payer: Medicare HMO | Source: Ambulatory Visit | Attending: Internal Medicine | Admitting: Internal Medicine

## 2020-12-26 ENCOUNTER — Ambulatory Visit (HOSPITAL_COMMUNITY): Payer: Medicare HMO | Admitting: Anesthesiology

## 2020-12-26 DIAGNOSIS — I110 Hypertensive heart disease with heart failure: Secondary | ICD-10-CM | POA: Diagnosis not present

## 2020-12-26 DIAGNOSIS — Z885 Allergy status to narcotic agent status: Secondary | ICD-10-CM | POA: Insufficient documentation

## 2020-12-26 DIAGNOSIS — D6869 Other thrombophilia: Secondary | ICD-10-CM | POA: Diagnosis not present

## 2020-12-26 DIAGNOSIS — E876 Hypokalemia: Secondary | ICD-10-CM | POA: Diagnosis not present

## 2020-12-26 DIAGNOSIS — Z79899 Other long term (current) drug therapy: Secondary | ICD-10-CM | POA: Diagnosis not present

## 2020-12-26 DIAGNOSIS — I4819 Other persistent atrial fibrillation: Secondary | ICD-10-CM | POA: Insufficient documentation

## 2020-12-26 DIAGNOSIS — Z7901 Long term (current) use of anticoagulants: Secondary | ICD-10-CM | POA: Diagnosis not present

## 2020-12-26 DIAGNOSIS — I5022 Chronic systolic (congestive) heart failure: Secondary | ICD-10-CM | POA: Insufficient documentation

## 2020-12-26 DIAGNOSIS — E559 Vitamin D deficiency, unspecified: Secondary | ICD-10-CM | POA: Diagnosis not present

## 2020-12-26 DIAGNOSIS — M8580 Other specified disorders of bone density and structure, unspecified site: Secondary | ICD-10-CM | POA: Diagnosis not present

## 2020-12-26 DIAGNOSIS — I4891 Unspecified atrial fibrillation: Secondary | ICD-10-CM | POA: Diagnosis not present

## 2020-12-26 HISTORY — PX: CARDIOVERSION: SHX1299

## 2020-12-26 SURGERY — CARDIOVERSION
Anesthesia: General

## 2020-12-26 MED ORDER — PROPOFOL 10 MG/ML IV BOLUS
INTRAVENOUS | Status: DC | PRN
  Administered 2020-12-26: 20 mg via INTRAVENOUS
  Administered 2020-12-26: 50 mg via INTRAVENOUS
  Administered 2020-12-26: 20 mg via INTRAVENOUS

## 2020-12-26 MED ORDER — SODIUM CHLORIDE 0.9 % IV SOLN
INTRAVENOUS | Status: AC | PRN
  Administered 2020-12-26: 500 mL via INTRAMUSCULAR

## 2020-12-26 MED ORDER — SODIUM CHLORIDE 0.9 % IV SOLN: INTRAVENOUS | Status: DC | PRN

## 2020-12-26 MED ORDER — LIDOCAINE 2% (20 MG/ML) 5 ML SYRINGE
INTRAMUSCULAR | Status: DC | PRN
  Administered 2020-12-26: 20 mg via INTRAVENOUS

## 2020-12-26 NOTE — CV Procedure (Addendum)
   Electrical Cardioversion Procedure Note Shelly Bowers [id number] Jan 24, 1951  Procedure: Electrical Cardioversion Indications:  Atrial Fibrillation  Time Out: Verified patient identification, verified procedure,medications/allergies/relevent history reviewed, required imaging and test results available.  Performed  Procedure Details  The patient was NPO after midnight. Anesthesia was administered at the beside  by Dr.Green with 20 mg of lidocaine and 90  mg propofol.  Cardioversion was done with synchronized biphasic defibrillation with AP pads with 200 Joules.  The patient converted to normal sinus rhythm. The patient tolerated the procedure well   IMPRESSION:  Successful cardioversion of atrial fibrillation    Shelly Bowers Shelly Bowers 12/26/2020, 10:38 AM  Addendum:  Notes concerns about heart rates 50-55s (sinus bradycardia).  We have discussed that if she has fatigue and decreased energy this week to call in: we may decrease her succinate dose.  Shelly Haskell, MD [address], [address] [place], [place] [address] [phone]  11:05 AM

## 2020-12-26 NOTE — Anesthesia Postprocedure Evaluation (Signed)
Anesthesia Post Note  Patient: Shelly Bowers  Procedure(s) Performed: CARDIOVERSION     Patient location during evaluation: Endoscopy Anesthesia Type: General Level of consciousness: awake Pain management: pain level controlled Vital Signs Assessment: post-procedure vital signs reviewed and stable Respiratory status: spontaneous breathing Cardiovascular status: stable Postop Assessment: no apparent nausea or vomiting Anesthetic complications: no   No notable events documented.  Last Vitals:  Vitals:   12/26/20 0953 12/26/20 1040  BP: (!) 155/95 (!) 115/59  Pulse: (!) 103 (!) 46  Resp: 14 14  Temp: (!) 36.3 C (!) 36.1 C  SpO2: 100% 99%    Last Pain:  Vitals:   12/26/20 1040  TempSrc: Temporal  PainSc: 0-No pain                 [name]

## 2020-12-26 NOTE — Anesthesia Postprocedure Evaluation (Signed)
Anesthesia Post Note  Patient: Shelly Bowers  Procedure(s) Performed: CARDIOVERSION     Patient location during evaluation: Endoscopy Anesthesia Type: General Level of consciousness: awake Pain management: pain level controlled Vital Signs Assessment: post-procedure vital signs reviewed and stable Respiratory status: spontaneous breathing Cardiovascular status: stable Postop Assessment: no apparent nausea or vomiting Anesthetic complications: no   No notable events documented.  Last Vitals:  Vitals:   12/26/20 1050 12/26/20 1100  BP: 113/67 114/67  Pulse: (!) 50 (!) 50  Resp: 14 13  Temp:    SpO2: 100% 98%    Last Pain:  Vitals:   12/26/20 1040  TempSrc: Temporal  PainSc: 0-No pain                 Taiz Bickle

## 2020-12-26 NOTE — Transfer of Care (Signed)
Immediate Anesthesia Transfer of Care Note  Patient: Shelly Bowers  Procedure(s) Performed: CARDIOVERSION  Patient Location: Endoscopy Unit  Anesthesia Type:General  Level of Consciousness: awake and alert   Airway & Oxygen Therapy: Patient Spontanous Breathing  Post-op Assessment: Report given to RN and Post -op Vital signs reviewed and stable  Post vital signs: Reviewed and stable  Last Vitals:  Vitals Value Taken Time  BP 127/59 12/26/20 1038  Temp    Pulse 50 12/26/20 1038  Resp 16 12/26/20 1038  SpO2 100 12/26/20 1038    Last Pain:  Vitals:   12/26/20 0953  TempSrc: Temporal  PainSc: 0-No pain         Complications: No notable events documented.

## 2020-12-26 NOTE — Interval H&P Note (Signed)
History and Physical Interval Note:  12/26/2020 10:24 AM  Trisha Mangle  has presented today for surgery, with the diagnosis of A-FIB.  The various methods of treatment have been discussed with the patient and family. After consideration of risks, benefits and other options for treatment, the patient has consented to  Procedure(s): CARDIOVERSION (N/A) as a surgical intervention.  The patient's history has been reviewed, patient examined, no change in status, stable for surgery.  I have reviewed the patient's chart and labs.  Questions were answered to the patient's satisfaction.     Donal Lynam A Jquan Egelston

## 2020-12-26 NOTE — Anesthesia Preprocedure Evaluation (Addendum)
Anesthesia Evaluation  Patient identified by MRN, date of birth, ID band Patient awake    Reviewed: Allergy & Precautions, NPO status , Patient's Chart, lab work & pertinent test results  Airway Mallampati: II  TM Distance: >3 FB     Dental   Pulmonary neg pulmonary ROS,    breath sounds clear to auscultation       Cardiovascular hypertension,  Rhythm:Irregular Rate:Normal     Neuro/Psych  Neuromuscular disease    GI/Hepatic negative GI ROS, Neg liver ROS,   Endo/Other  negative endocrine ROS  Renal/GU negative Renal ROS     Musculoskeletal   Abdominal   Peds  Hematology   Anesthesia Other Findings   Reproductive/Obstetrics                             Anesthesia Physical Anesthesia Plan  ASA: 3  Anesthesia Plan: General   Post-op Pain Management:    Induction: Intravenous  PONV Risk Score and Plan: 3 and Propofol infusion and Treatment may vary due to age or medical condition  Airway Management Planned: Nasal Cannula, Mask and Simple Face Mask  Additional Equipment:   Intra-op Plan:   Post-operative Plan:   Informed Consent: I have reviewed the patients History and Physical, chart, labs and discussed the procedure including the risks, benefits and alternatives for the proposed anesthesia with the patient or authorized representative who has indicated his/her understanding and acceptance.     Dental advisory given  Plan Discussed with: CRNA and Anesthesiologist  Anesthesia Plan Comments:        Anesthesia Quick Evaluation

## 2020-12-28 ENCOUNTER — Telehealth: Payer: Self-pay

## 2020-12-28 ENCOUNTER — Encounter (HOSPITAL_COMMUNITY): Payer: Self-pay | Admitting: Internal Medicine

## 2020-12-28 NOTE — Telephone Encounter (Signed)
-----   Message from Rollen Sox, [facility] sent at 12/27/2020  5:50 PM EDT ----- Regarding: RE: Successful DCCV; Needs prescription drug assistance Will print applications and contact patient  Melford Tullier, can you organize? ----- Message ----- From: Werner Lean, MD Sent: 12/26/2020  10:42 AM EDT To: Minus Breeding, MD, Oliver Barre, PA, # Subject: Successful DCCV; Needs prescription drug ass#  FYI.  Gerald Stabs, I don't know if she would be eligible for either the Entreso or Eliquis assist but she is getting eaten up in these two meds (has LVEF ~35%).  Wanted to see if we had something we could offer to help.  Thanks, MAC

## 2020-12-28 NOTE — Telephone Encounter (Signed)
Called and lmom pt that we will mail her patient assistance forms for eliquis and entresto and for them to mail it back to the return address on the envelope

## 2020-12-30 ENCOUNTER — Telehealth (HOSPITAL_COMMUNITY): Payer: Self-pay | Admitting: *Deleted

## 2020-12-30 DIAGNOSIS — I480 Paroxysmal atrial fibrillation: Secondary | ICD-10-CM

## 2020-12-30 MED ORDER — METOPROLOL SUCCINATE ER 25 MG PO TB24: 25.0000 mg | ORAL_TABLET | Freq: Every day | ORAL | 11 refills | Status: DC

## 2020-12-30 NOTE — Telephone Encounter (Signed)
Patient called in stating she has been seeing her HR in the 44 range since cardioversion. She felt "pretty bad" the first 2 days post cardioversion felt some better but noticed the HRs and was concerned. Discussed with Adline Peals PA will decrease metoprolol to 25mg  once a day. Pt verbalized understanding and has follow up in place for next week.

## 2021-01-01 ENCOUNTER — Other Ambulatory Visit: Payer: Self-pay | Admitting: Cardiology

## 2021-01-03 ENCOUNTER — Encounter: Payer: Self-pay | Admitting: Neurology

## 2021-01-03 ENCOUNTER — Encounter: Payer: Self-pay | Admitting: *Deleted

## 2021-01-03 ENCOUNTER — Ambulatory Visit (HOSPITAL_COMMUNITY)
Admission: RE | Admit: 2021-01-03 | Discharge: 2021-01-03 | Disposition: A | Discharge status: Home / Self Care | Payer: Medicare HMO | Source: Ambulatory Visit | Attending: Physician Assistant | Admitting: Physician Assistant

## 2021-01-03 ENCOUNTER — Other Ambulatory Visit: Payer: Self-pay

## 2021-01-03 ENCOUNTER — Telehealth: Payer: Self-pay | Admitting: *Deleted

## 2021-01-03 ENCOUNTER — Encounter (HOSPITAL_COMMUNITY): Payer: Self-pay | Admitting: Physician Assistant

## 2021-01-03 VITALS — BP 152/82 | HR 149 | Ht 66.0 in | Wt 176.6 lb

## 2021-01-03 DIAGNOSIS — Z79899 Other long term (current) drug therapy: Secondary | ICD-10-CM | POA: Insufficient documentation

## 2021-01-03 DIAGNOSIS — Z885 Allergy status to narcotic agent status: Secondary | ICD-10-CM | POA: Diagnosis not present

## 2021-01-03 DIAGNOSIS — I110 Hypertensive heart disease with heart failure: Secondary | ICD-10-CM | POA: Diagnosis not present

## 2021-01-03 DIAGNOSIS — I4819 Other persistent atrial fibrillation: Secondary | ICD-10-CM

## 2021-01-03 DIAGNOSIS — G4733 Obstructive sleep apnea (adult) (pediatric): Secondary | ICD-10-CM | POA: Insufficient documentation

## 2021-01-03 DIAGNOSIS — Z8249 Family history of ischemic heart disease and other diseases of the circulatory system: Secondary | ICD-10-CM | POA: Diagnosis not present

## 2021-01-03 DIAGNOSIS — Z7901 Long term (current) use of anticoagulants: Secondary | ICD-10-CM | POA: Diagnosis not present

## 2021-01-03 DIAGNOSIS — D6869 Other thrombophilia: Secondary | ICD-10-CM | POA: Diagnosis not present

## 2021-01-03 DIAGNOSIS — I5022 Chronic systolic (congestive) heart failure: Secondary | ICD-10-CM | POA: Diagnosis not present

## 2021-01-03 MED ORDER — AMIODARONE HCL 200 MG PO TABS: ORAL_TABLET | ORAL | 0 refills | Status: DC

## 2021-01-03 MED ORDER — METOPROLOL SUCCINATE ER 25 MG PO TB24: 25.0000 mg | ORAL_TABLET | Freq: Two times a day (BID) | ORAL | 11 refills | Status: DC

## 2021-01-03 NOTE — Telephone Encounter (Signed)
Pt's pharmacy is requesting a refill on pantoprazole. Would Dr. Lambert like to refill this medication? Please address 

## 2021-01-03 NOTE — Telephone Encounter (Signed)
-----   Message from Larey Seat, MD sent at 01/03/2021 11:44 AM EDT ----- IMPRESSION:  This HST confirms the presence of severe obstructive sleep apnea, non-REM sleep dependent associated with brief but not infrequent hypoxia events.  Supine AHI was higher than the nonsupine AHI which would lead to a recommendation to avoid sleeping on her back and instead moved to the left or right side.  RECOMMENDATION: Given the history of atrial fibrillation and cardiomyopathy , it is evident that treatment by CPAP would be necessary as neither positional component no weight loss will completely reduce this patient's apnea risk.   I will write for an auto titration CPAP device with a pressure range between 7 and 17 cmH2O pressure to centimeter EPR, heated humidification, and a mask or interface of the patient's choice.  Please offer her a nasal pillow or cradle and fit the patient in person while in reclined position.   INTERPRETING PHYSICIAN:

## 2021-01-03 NOTE — Procedures (Signed)
[facility] TEST REPORT ( by Watch PAT)   STUDY DATE:  12-12-2020 DOB:  September 01, 1950    ORDERING CLINICIAN: Larey Seat, MD  REFERRING CLINICIAN: Lelon Frohlich, MD   CLINICAL INFORMATION/HISTORY: 11/07/2020 Fairly recent onset of atrial fibrillation.   She has a history of hypertension hyperlipidemia and is overweight.  On Memorial Day, 19 Aug 2020, she developed shortness of breath and a cough and was concerned that she may have contracted COVID-19 , had a rattling cough at night, felt poorly. On Tuesday ,following that weekend, she scheduled a [facility]  While there, she became short of breath again and her heart rate reached 170 bpm. She was sent to the hospital directly for evaluation and was found to be in new onset atrial fibrillation with rapid ventricular response.   She was admitted on 5-31 and discharged. 4 days later she had a transesophageal echo DCCV on 3 June was found to have a new onset heart failure with reduced ejection fraction of 30 to 35% by echocardiogram.-this presumed to be diastolic heart failure related to atrial fibrillation but mentioning a cardiomyopathy.  She was started on metoprolol 25 mg for rate control.  She was also anticoagulated on Eliquis.  After hospital discharge her apple watch has alarmed her several times to have had rapid heart rates and the on-call cardiologist recommended increasing the metoprolol to 25 twice daily.    Epworth sleepiness score: 7/24.   BMI: 29.4 kg/m   Neck Circumference: 15"   FINDINGS:   Sleep Summary: The total recording time amounted to 9 hours and 36 minutes of which 8 hours and 11 minutes were considered total sleep time.  Percentage of REM was 17.4% during sleep time.                                 Respiratory Indices:   The calculated overall apnea-hypopnea index was 32.9/h and was only slightly elevated during REM sleep.  The RDI was higher than the AHI  indicating additional arousals from snoring.  17 central apneas were also noted.  During sleep in supine position there was a significant increase in apnea with an AHI of 48.3/h versus a non-REM supine sleep position AHI of 20.5/h.  Snoring level was highly elevated the mean volume was 44 dB and the patient snored for 47% of the total sleep time.                                                      Oxygen Saturation Statistics:   O2 Saturation Range (%): Oxygen saturation varied between a nadir of 82% and a maximum of 100% with a mean oxygenation of 92%.  O2 Saturation (minutes) <89%:  Total time in hypoxia was 28.9 minutes.  This was the equivalent of 5.9% of total sleep time.                                             Pulse Rate Statistics:    Pulse Range: Varied between a heart rate of 60 and a maximum of 126 bpm with a mean  heart rate of 93 bpm.                 IMPRESSION:  This HST confirms the presence of severe obstructive sleep apnea, non-REM sleep dependent associated with brief but not infrequent hypoxia events.  Supine AHI was higher than the nonsupine AHI which would lead to a recommendation to avoid sleeping on her back and instead moved to the left or right side.   RECOMMENDATION: Given the history of atrial fibrillation treatment by CPAP would be necessary as neither positional component no weight loss will completely reduce this patient's apnea risk.  I will write for an auto titration CPAP device with a pressure range between 7 and 17 cmH2O pressure to centimeter EPR, heated humidification, and a mask or interface of the patient's choice.  Please offer her a nasal pillow or cradle and fit the patient in person while in reclined position.    INTERPRETING PHYSICIAN:   Larey Seat, MD   Medical Director of [facility].

## 2021-01-03 NOTE — Telephone Encounter (Signed)
I called pt. I advised pt that Dr. Brett Fairy reviewed their sleep study results and found that pt has severe sleep apnea. Dr. Brett Fairy recommends that pt start on cpap. I reviewed PAP compliance expectations with the pt. Pt is agreeable to starting a CPAP. I advised pt that an order will be sent to a DME, [employer], and [employer] will call the pt within about one week after they file with the pt's insurance. [employer] will show the pt how to use the machine, fit for masks, and troubleshoot the CPAP if needed. A follow up appt was made for insurance purposes with Dr. Brett Fairy on 04/11/21 at 1:30p. Pt verbalized understanding to arrive 15 minutes early and bring their CPAP. A letter with all of this information in it will be mailed to the pt as a reminder. I verified with the pt that the address we have on file is correct. Pt verbalized understanding of results. Pt had no questions at this time but was encouraged to call back if questions arise. I have sent the order to [employer] and have received confirmation that they have received the order.

## 2021-01-03 NOTE — Addendum Note (Signed)
Addended by: Larey Seat on: 01/03/2021 11:44 AM   Modules accepted: Orders

## 2021-01-03 NOTE — Patient Instructions (Signed)
Stop digoxin  Increase metoprolol back to 25mg  twice a day  Start Amiodarone 200mg  twice a day for 1 month then we will reduce to once a day

## 2021-01-03 NOTE — Progress Notes (Signed)
Primary Care Physician: Isaac Bliss, Rayford Halsted, MD Primary Cardiologist: Dr Percival Spanish  Primary Electrophysiologist: Dr Quentin Ore Referring Physician: Dr [name] is a 70 y.o. female with a history of HTN, chronic systolic CHF, atrial fibrillation who presents for follow up in the [facility]. Patient is on Eliquis for a CHADS2VASC score of 4. She is s/p afib ablation 11/21/20 with Dr Quentin Ore. Patient called the clinic 11/29/20 to report labile heart rates associated with some fatigue and was found to be in persistent afib.   On follow up today, patient is s/p DCCV on 12/26/20. She woke with heart racing on 01/01/21 which lasted several hours. She remains in rapid afib today but is completely asymptomatic. There were no triggers that she could identify. She denies any bleeding issues on anticoagulation.   Today, she denies symptoms of palpitations, chest pain, shortness of breath, orthopnea, PND, lower extremity edema, dizziness, presyncope, syncope, snoring, daytime somnolence, bleeding, or neurologic sequela. The patient is tolerating medications without difficulties and is otherwise without complaint today.    Atrial Fibrillation Risk Factors:  she does not have symptoms or diagnosis of sleep apnea. she does not have a history of rheumatic fever.   she has a BMI of Body mass index is 28.5 kg/m.Marland Kitchen Filed Weights   01/03/21 1345  Weight: 80.1 kg     Family History  Problem Relation Age of Onset   Breast cancer Mother 83   Atrial fibrillation Father      Atrial Fibrillation Management history:  Previous antiarrhythmic drugs: none Previous cardioversions: 08/25/20, 12/26/20 Previous ablations: 11/21/20 CHADS2VASC score: 4 Anticoagulation history: Eliquis   Past Medical History:  Diagnosis Date   Atrial fibrillation ([place])    Cardiomyopathy ([place])    HTN (hypertension)    Hyperlipidemia    Osteopenia    Past Surgical History:   Procedure Laterality Date   ATRIAL FIBRILLATION ABLATION N/A 11/21/2020   Procedure: ATRIAL FIBRILLATION ABLATION;  Surgeon: Vickie Epley, MD;  Location: [facility];  Service: Cardiovascular;  Laterality: N/A;   AUGMENTATION MAMMAPLASTY Bilateral    BREAST EXCISIONAL BIOPSY Left    BUBBLE STUDY  08/25/2020   Procedure: BUBBLE STUDY;  Surgeon: Werner Lean, MD;  Location: [facility];  Service: Cardiovascular;;   CARDIOVERSION N/A 08/25/2020   Procedure: CARDIOVERSION;  Surgeon: Werner Lean, MD;  Location: [facility];  Service: Cardiovascular;  Laterality: N/A;   CARDIOVERSION N/A 12/26/2020   Procedure: CARDIOVERSION;  Surgeon: Werner Lean, MD;  Location: [facility];  Service: Cardiovascular;  Laterality: N/A;   INCISION AND DRAINAGE ABSCESS Left 12/27/2015   Procedure: INCISION AND DRAINAGE ABSCESS;  Surgeon: Daryll Brod, MD;  Location: [facility];  Service: Orthopedics;  Laterality: Left;   TEE WITHOUT CARDIOVERSION N/A 08/25/2020   Procedure: TRANSESOPHAGEAL ECHOCARDIOGRAM (TEE);  Surgeon: Werner Lean, MD;  Location: [facility];  Service: Cardiovascular;  Laterality: N/A;    Current Outpatient Medications  Medication Sig Dispense Refill   apixaban (ELIQUIS) 5 MG TABS tablet Take 1 tablet (5 mg total) by mouth 2 (two) times daily. 180 tablet 3   atorvastatin (LIPITOR) 10 MG tablet TAKE ONE TABLET BY MOUTH ONE TIME DAILY 90 tablet 1   CALCIUM-VITAMIN D PO Take 1 tablet by mouth daily.     celecoxib (CELEBREX) 200 MG capsule TAKE 1 CAPSULE BY MOUTH 2 TIMES DAILY AS NEEDED 90 capsule 1   cetirizine (ZYRTEC) 10 MG tablet Take 10  mg by mouth daily.     cholecalciferol (VITAMIN D3) 25 MCG (1000 UNIT) tablet Take 1,000 Units by mouth daily.     Cyanocobalamin (B-12 PO) Take 1,000 mcg by mouth daily.     digoxin (LANOXIN) 0.125 MG tablet Take 1 tablet (0.125 mg total) by mouth daily. 90 tablet 3   folic acid (FOLVITE) 973  MCG tablet Take 800 mcg by mouth daily.     furosemide (LASIX) 20 MG tablet TAKE 1 TABLET (20 MG TOTAL) BY MOUTH DAILY AS NEEDED FOR EDEMA (WEIGHT GAIN). 30 tablet 5   ibuprofen (ADVIL,MOTRIN) 200 MG tablet Take 400 mg by mouth every 6 (six) hours as needed for headache, fever or mild pain.     metoprolol succinate (TOPROL XL) 25 MG 24 hr tablet Take 1 tablet (25 mg total) by mouth daily. 30 tablet 11   metroNIDAZOLE (METROGEL) 0.75 % gel Apply 1 application topically at bedtime.     naphazoline-pheniramine (VISINE) 0.025-0.3 % ophthalmic solution Place 1 drop into both eyes daily as needed for eye irritation.     pantoprazole (PROTONIX) 40 MG tablet Take 1 tablet (40 mg total) by mouth daily. 45 tablet 0   sacubitril-valsartan (ENTRESTO) 24-26 MG Take 1 tablet by mouth 2 (two) times daily. 180 tablet 3   triamcinolone (NASACORT) 55 MCG/ACT AERO nasal inhaler Place 2 sprays into the nose 2 (two) times daily as needed (Congestion).     vitamin E 1000 UNIT capsule Take 1,000 Units by mouth daily.     No current facility-administered medications for this encounter.    Allergies  Allergen Reactions   Codeine Nausea Only   Hydrocodone Nausea And Vomiting    Social History   Socioeconomic History   Marital status: Married    Spouse name: Not on file   Number of children: Not on file   Years of education: Not on file   Highest education level: Not on file  Occupational History   Not on file  Tobacco Use   Smoking status: Never   Smokeless tobacco: Never  Substance and Sexual Activity   Alcohol use: Yes    Alcohol/week: 14.0 standard drinks    Types: 14 Glasses of wine per week    Comment: 1 glass of wine nightly   Drug use: No   Sexual activity: Not on file  Other Topics Concern   Not on file  Social History Narrative   Left handed   Caffeine use: tea sometimes   Soda-zero sugar/no caffeine ginger ale   1-2 cups coffee per day (decaf)   Social Determinants of Health    Financial Resource Strain: Not on file  Food Insecurity: Not on file  Transportation Needs: Not on file  Physical Activity: Not on file  Stress: Not on file  Social Connections: Not on file  Intimate Partner Violence: Not on file     ROS- All systems are reviewed and negative except as per the HPI above.  Physical Exam: Vitals:   01/03/21 1345  BP: (!) 152/82  Pulse: (!) 149  Weight: 80.1 kg  Height: 5\' 6"  (1.676 m)    GEN- The patient is a well appearing female, alert and oriented x 3 today.   HEENT-head normocephalic, atraumatic, sclera clear, conjunctiva pink, hearing intact, trachea midline. Lungs- Clear to ausculation bilaterally, normal work of breathing Heart- irregular rate and rhythm, tachycardia, no murmurs, rubs or gallops  GI- soft, NT, ND, + BS Extremities- no clubbing, cyanosis, or edema MS- no significant  deformity or atrophy Skin- no rash or lesion Psych- euthymic mood, full affect Neuro- strength and sensation are intact   Wt Readings from Last 3 Encounters:  01/03/21 80.1 kg  12/26/20 78.5 kg  12/19/20 79.9 kg    EKG today demonstrates  Atypical atrial flutter with variable block Vent. rate 149 BPM PR interval * ms QRS duration 78 ms QT/QTcB 292/459 ms  Echo 08/24/20 demonstrated   1. Left ventricular ejection fraction, by estimation, is 30 to 35%. The left ventricle has moderately decreased function. The left ventricle demonstrates global hypokinesis. There is mild left ventricular hypertrophy. Left ventricular diastolic parameters are indeterminate.   2. Right ventricular systolic function is mildly reduced. The right  ventricular size is mildly enlarged. Tricuspid regurgitation signal is  inadequate for assessing PA pressure.   3. Left atrial size was mild to moderately dilated.   4. Right atrial size was mildly dilated.   5. The mitral valve is degenerative. Mild mitral valve regurgitation. No evidence of mitral stenosis.   6. The aortic  valve is grossly normal. Aortic valve regurgitation is not visualized. No aortic stenosis is present.   7. The inferior vena cava is dilated in size with <50% respiratory  variability, suggesting right atrial pressure of 15 mmHg.   Epic records are reviewed at length today  CHA2DS2-VASc Score = 4  The patient's score is based upon: CHF History: 1 HTN History: 1 Diabetes History: 0 Stroke History: 0 Vascular Disease History: 0 Age Score: 1 Gender Score: 1       ASSESSMENT AND PLAN: 1. Persistent Atrial Fibrillation (ICD10:  I48.19) The patient's CHA2DS2-VASc score is 4, indicating a 4.8% annual risk of stroke.   S/p afib ablation 11/21/20 S/p DCCV on 12/26/20 with early return of rapid afib.  Increase Toprol to 25 mg BID Start amiodarone 200 mg BID, anticipate this will be short term post ablation.  Stop digoxin.  Continue Eliquis 5 mg BID with no missed doses for 3 months post ablation.   2. Secondary Hypercoagulable State (ICD10:  D68.69) The patient is at significant risk for stroke/thromboembolism based upon her CHA2DS2-VASc Score of 4.  Continue Apixaban (Eliquis).   3. HTN Stable, no changes today.  4. Chronic systolic CHF EF 26-33% No signs or symptoms of fluid overload.  5. Severe OSA Followed by Dr Brett Fairy    Follow up in the AF clinic in one week.    [facility] [address] [place], [place] [address] [phone] 01/03/2021 1:54 PM

## 2021-01-03 NOTE — Progress Notes (Signed)
IMPRESSION:  This HST confirms the presence of severe obstructive sleep apnea, non-REM sleep dependent associated with brief but not infrequent hypoxia events.  Supine AHI was higher than the nonsupine AHI which would lead to a recommendation to avoid sleeping on her back and instead moved to the left or right side.  RECOMMENDATION: Given the history of atrial fibrillation and cardiomyopathy , it is evident that treatment by CPAP would be necessary as neither positional component no weight loss will completely reduce this patient's apnea risk.   I will write for an auto titration CPAP device with a pressure range between 7 and 17 cmH2O pressure to centimeter EPR, heated humidification, and a mask or interface of the patient's choice.  Please offer her a nasal pillow or cradle and fit the patient in person while in reclined position.   INTERPRETING PHYSICIAN:

## 2021-01-03 NOTE — Telephone Encounter (Signed)
LVM for pt to call about results. °

## 2021-01-09 ENCOUNTER — Ambulatory Visit (HOSPITAL_COMMUNITY)
Admission: RE | Admit: 2021-01-09 | Discharge: 2021-01-09 | Disposition: A | Discharge status: Home / Self Care | Payer: Medicare HMO | Source: Ambulatory Visit | Attending: Physician Assistant | Admitting: Physician Assistant

## 2021-01-09 ENCOUNTER — Other Ambulatory Visit: Payer: Self-pay

## 2021-01-09 VITALS — BP 132/56 | HR 43 | Ht 66.0 in | Wt 174.2 lb

## 2021-01-09 DIAGNOSIS — D6869 Other thrombophilia: Secondary | ICD-10-CM | POA: Diagnosis not present

## 2021-01-09 DIAGNOSIS — I429 Cardiomyopathy, unspecified: Secondary | ICD-10-CM | POA: Insufficient documentation

## 2021-01-09 DIAGNOSIS — Z8249 Family history of ischemic heart disease and other diseases of the circulatory system: Secondary | ICD-10-CM | POA: Diagnosis not present

## 2021-01-09 DIAGNOSIS — I110 Hypertensive heart disease with heart failure: Secondary | ICD-10-CM | POA: Diagnosis not present

## 2021-01-09 DIAGNOSIS — I4819 Other persistent atrial fibrillation: Secondary | ICD-10-CM | POA: Diagnosis not present

## 2021-01-09 DIAGNOSIS — Z7901 Long term (current) use of anticoagulants: Secondary | ICD-10-CM | POA: Diagnosis not present

## 2021-01-09 DIAGNOSIS — I5022 Chronic systolic (congestive) heart failure: Secondary | ICD-10-CM | POA: Insufficient documentation

## 2021-01-09 DIAGNOSIS — Z79899 Other long term (current) drug therapy: Secondary | ICD-10-CM | POA: Diagnosis not present

## 2021-01-09 DIAGNOSIS — Z885 Allergy status to narcotic agent status: Secondary | ICD-10-CM | POA: Diagnosis not present

## 2021-01-09 DIAGNOSIS — G4733 Obstructive sleep apnea (adult) (pediatric): Secondary | ICD-10-CM | POA: Insufficient documentation

## 2021-01-09 DIAGNOSIS — E785 Hyperlipidemia, unspecified: Secondary | ICD-10-CM | POA: Insufficient documentation

## 2021-01-09 MED ORDER — AMIODARONE HCL 200 MG PO TABS: ORAL_TABLET | ORAL | Status: DC

## 2021-01-09 MED ORDER — METOPROLOL SUCCINATE ER 25 MG PO TB24: 25.0000 mg | ORAL_TABLET | Freq: Every day | ORAL | Status: DC

## 2021-01-09 NOTE — Patient Instructions (Signed)
Decrease Metoprolol to 25mg - Take one tablet by mouth daily Decrease Amiodarone to 200mg - Take one tablet by mouth daily

## 2021-01-09 NOTE — Progress Notes (Signed)
Primary Care Physician: Isaac Bliss, Rayford Halsted, MD Primary Cardiologist: Dr Percival Spanish  Primary Electrophysiologist: Dr Quentin Ore Referring Physician: Dr [name] is a 70 y.o. female with a history of HTN, chronic systolic CHF, atrial fibrillation who presents for follow up in the [facility]. Patient is on Eliquis for a CHADS2VASC score of 4. She is s/p afib ablation 11/21/20 with Dr Quentin Ore. Patient called the clinic 11/29/20 to report labile heart rates associated with some fatigue and was found to be in persistent afib. Patient is s/p DCCV on 12/26/20. She woke with heart racing on 01/01/21 which lasted several hours and was still in afib on follow up. She was started on amiodarone and rate control was increased.   On follow up today, patient reports that she converted to SR after the first dose of amiodarone. She has been somewhat fatigued and "foggy minded" since then. Her heart rates have been in the low to mid 40s.   Today, she denies symptoms of palpitations, chest pain, shortness of breath, orthopnea, PND, lower extremity edema, dizziness, presyncope, syncope, snoring, daytime somnolence, bleeding, or neurologic sequela. The patient is tolerating medications without difficulties and is otherwise without complaint today.    Atrial Fibrillation Risk Factors:  she does not have symptoms or diagnosis of sleep apnea. she does not have a history of rheumatic fever.   she has a BMI of Body mass index is 28.12 kg/m.Marland Kitchen Filed Weights   01/09/21 1406  Weight: 79 kg      Family History  Problem Relation Age of Onset   Breast cancer Mother 43   Atrial fibrillation Father      Atrial Fibrillation Management history:  Previous antiarrhythmic drugs: amiodarone  Previous cardioversions: 08/25/20, 12/26/20 Previous ablations: 11/21/20 CHADS2VASC score: 4 Anticoagulation history: Eliquis   Past Medical History:  Diagnosis Date   Atrial  fibrillation ([place])    Cardiomyopathy ([place])    HTN (hypertension)    Hyperlipidemia    Osteopenia    Past Surgical History:  Procedure Laterality Date   ATRIAL FIBRILLATION ABLATION N/A 11/21/2020   Procedure: ATRIAL FIBRILLATION ABLATION;  Surgeon: Vickie Epley, MD;  Location: [facility];  Service: Cardiovascular;  Laterality: N/A;   AUGMENTATION MAMMAPLASTY Bilateral    BREAST EXCISIONAL BIOPSY Left    BUBBLE STUDY  08/25/2020   Procedure: BUBBLE STUDY;  Surgeon: Werner Lean, MD;  Location: [facility];  Service: Cardiovascular;;   CARDIOVERSION N/A 08/25/2020   Procedure: CARDIOVERSION;  Surgeon: Werner Lean, MD;  Location: [facility];  Service: Cardiovascular;  Laterality: N/A;   CARDIOVERSION N/A 12/26/2020   Procedure: CARDIOVERSION;  Surgeon: Werner Lean, MD;  Location: [facility];  Service: Cardiovascular;  Laterality: N/A;   INCISION AND DRAINAGE ABSCESS Left 12/27/2015   Procedure: INCISION AND DRAINAGE ABSCESS;  Surgeon: Daryll Brod, MD;  Location: [facility];  Service: Orthopedics;  Laterality: Left;   TEE WITHOUT CARDIOVERSION N/A 08/25/2020   Procedure: TRANSESOPHAGEAL ECHOCARDIOGRAM (TEE);  Surgeon: Werner Lean, MD;  Location: [facility];  Service: Cardiovascular;  Laterality: N/A;    Current Outpatient Medications  Medication Sig Dispense Refill   apixaban (ELIQUIS) 5 MG TABS tablet Take 1 tablet (5 mg total) by mouth 2 (two) times daily. 180 tablet 3   atorvastatin (LIPITOR) 10 MG tablet TAKE ONE TABLET BY MOUTH ONE TIME DAILY 90 tablet 1   CALCIUM-VITAMIN D PO Take 1 tablet by mouth daily.  cetirizine (ZYRTEC) 10 MG tablet Take 10 mg by mouth daily.     cholecalciferol (VITAMIN D3) 25 MCG (1000 UNIT) tablet Take 1,000 Units by mouth daily.     Cyanocobalamin (B-12 PO) Take 1,000 mcg by mouth daily.     folic acid (FOLVITE) 300 MCG tablet Take 800 mcg by mouth daily.     furosemide (LASIX) 20  MG tablet TAKE 1 TABLET (20 MG TOTAL) BY MOUTH DAILY AS NEEDED FOR EDEMA (WEIGHT GAIN). 30 tablet 5   ibuprofen (ADVIL,MOTRIN) 200 MG tablet Take 400 mg by mouth every 6 (six) hours as needed for headache, fever or mild pain.     metroNIDAZOLE (METROGEL) 0.75 % gel Apply 1 application topically at bedtime.     naphazoline-pheniramine (VISINE) 0.025-0.3 % ophthalmic solution Place 1 drop into both eyes daily as needed for eye irritation.     sacubitril-valsartan (ENTRESTO) 24-26 MG Take 1 tablet by mouth 2 (two) times daily. 180 tablet 3   triamcinolone (NASACORT) 55 MCG/ACT AERO nasal inhaler Place 2 sprays into the nose 2 (two) times daily as needed (Congestion).     vitamin E 1000 UNIT capsule Take 1,000 Units by mouth daily.     amiodarone (PACERONE) 200 MG tablet Take 1 tablet by mouth daily     celecoxib (CELEBREX) 200 MG capsule TAKE 1 CAPSULE BY MOUTH 2 TIMES DAILY AS NEEDED (Patient not taking: Reported on 01/09/2021) 90 capsule 1   metoprolol succinate (TOPROL XL) 25 MG 24 hr tablet Take 1 tablet (25 mg total) by mouth daily.     No current facility-administered medications for this encounter.    Allergies  Allergen Reactions   Codeine Nausea Only   Hydrocodone Nausea And Vomiting    Social History   Socioeconomic History   Marital status: Married    Spouse name: Not on file   Number of children: Not on file   Years of education: Not on file   Highest education level: Not on file  Occupational History   Not on file  Tobacco Use   Smoking status: Never   Smokeless tobacco: Never  Substance and Sexual Activity   Alcohol use: Yes    Alcohol/week: 14.0 standard drinks    Types: 14 Glasses of wine per week    Comment: 1 glass of wine nightly   Drug use: No   Sexual activity: Not on file  Other Topics Concern   Not on file  Social History Narrative   Left handed   Caffeine use: tea sometimes   Soda-zero sugar/no caffeine ginger ale   1-2 cups coffee per day (decaf)    Social Determinants of Health   Financial Resource Strain: Not on file  Food Insecurity: Not on file  Transportation Needs: Not on file  Physical Activity: Not on file  Stress: Not on file  Social Connections: Not on file  Intimate Partner Violence: Not on file     ROS- All systems are reviewed and negative except as per the HPI above.  Physical Exam: Vitals:   01/09/21 1406  BP: (!) 132/56  Pulse: (!) 43  Weight: 79 kg  Height: 5\' 6"  (1.676 m)    GEN- The patient is a well appearing female, alert and oriented x 3 today.   HEENT-head normocephalic, atraumatic, sclera clear, conjunctiva pink, hearing intact, trachea midline. Lungs- Clear to ausculation bilaterally, normal work of breathing Heart- Regular rate and rhythm, bradycardia, no murmurs, rubs or gallops  GI- soft, NT, ND, + BS  Extremities- no clubbing, cyanosis, or edema MS- no significant deformity or atrophy Skin- no rash or lesion Psych- euthymic mood, full affect Neuro- strength and sensation are intact   Wt Readings from Last 3 Encounters:  01/09/21 79 kg  01/03/21 80.1 kg  12/26/20 78.5 kg    EKG today demonstrates  SB Vent. rate 43 BPM PR interval 166 ms QRS duration 82 ms QT/QTcB 502/424 ms  Echo 08/24/20 demonstrated   1. Left ventricular ejection fraction, by estimation, is 30 to 35%. The left ventricle has moderately decreased function. The left ventricle demonstrates global hypokinesis. There is mild left ventricular hypertrophy. Left ventricular diastolic parameters are indeterminate.   2. Right ventricular systolic function is mildly reduced. The right  ventricular size is mildly enlarged. Tricuspid regurgitation signal is  inadequate for assessing PA pressure.   3. Left atrial size was mild to moderately dilated.   4. Right atrial size was mildly dilated.   5. The mitral valve is degenerative. Mild mitral valve regurgitation. No evidence of mitral stenosis.   6. The aortic valve is  grossly normal. Aortic valve regurgitation is not visualized. No aortic stenosis is present.   7. The inferior vena cava is dilated in size with <50% respiratory  variability, suggesting right atrial pressure of 15 mmHg.   Epic records are reviewed at length today  CHA2DS2-VASc Score = 4  The patient's score is based upon: CHF History: 1 HTN History: 1 Diabetes History: 0 Stroke History: 0 Vascular Disease History: 0 Age Score: 1 Gender Score: 1       ASSESSMENT AND PLAN: 1. Persistent Atrial Fibrillation (ICD10:  I48.19) The patient's CHA2DS2-VASc score is 4, indicating a 4.8% annual risk of stroke.   S/p afib ablation 11/21/20 S/p DCCV on 12/26/20 with early return of rapid afib.  Patient chemically converted on amiodarone. Decrease amiodarone to 200 mg daily, anticipate this medication, will be short term post ablation.  Decrease Toprol to 25 mg daily Continue Eliquis 5 mg BID with no missed doses for 3 months post ablation.   2. Secondary Hypercoagulable State (ICD10:  D68.69) The patient is at significant risk for stroke/thromboembolism based upon her CHA2DS2-VASc Score of 4.  Continue Apixaban (Eliquis).   3. HTN Stable, no changes today.  4. Chronic systolic CHF EF 16-10% No signs or symptoms of fluid overload.  5. Severe OSA Followed by Dr Brett Fairy    Follow up with Dr Quentin Ore as scheduled.    [facility] [address] [place], [place] [address] [phone] 01/09/2021 4:59 PM

## 2021-01-13 ENCOUNTER — Telehealth (HOSPITAL_COMMUNITY): Payer: Self-pay | Admitting: *Deleted

## 2021-01-13 DIAGNOSIS — I4819 Other persistent atrial fibrillation: Secondary | ICD-10-CM

## 2021-01-13 MED ORDER — METOPROLOL SUCCINATE ER 25 MG PO TB24: 12.5000 mg | ORAL_TABLET | Freq: Every day | ORAL | 11 refills | Status: DC

## 2021-01-13 NOTE — Telephone Encounter (Signed)
Patient called in stating her HRs are still 38-44 at rest to 66 with activity despite medication change. Pt feels well no dizziness or shortness of breath. Discussed with Adline Peals PA will decrease metoprolol to 12.5mg  once a day - call with update on Monday. Pt in agreement.

## 2021-01-16 NOTE — Telephone Encounter (Signed)
PT heart rates 50-55 on 12.5mg  of metoprolol feels great. Will continue this dose.

## 2021-01-25 ENCOUNTER — Encounter: Payer: Medicare HMO | Admitting: Internal Medicine

## 2021-01-25 ENCOUNTER — Other Ambulatory Visit (HOSPITAL_COMMUNITY): Payer: Self-pay | Admitting: Physician Assistant

## 2021-02-03 ENCOUNTER — Encounter: Payer: Medicare HMO | Admitting: Internal Medicine

## 2021-02-07 ENCOUNTER — Ambulatory Visit: Payer: Medicare HMO | Admitting: Neurology

## 2021-02-09 ENCOUNTER — Ambulatory Visit (INDEPENDENT_AMBULATORY_CARE_PROVIDER_SITE_OTHER): Payer: Medicare HMO | Admitting: Internal Medicine

## 2021-02-09 ENCOUNTER — Encounter: Payer: Self-pay | Admitting: Internal Medicine

## 2021-02-09 VITALS — BP 130/80 | HR 50 | Temp 98.0°F | Ht 65.0 in | Wt 173.9 lb

## 2021-02-09 DIAGNOSIS — I10 Essential (primary) hypertension: Secondary | ICD-10-CM | POA: Diagnosis not present

## 2021-02-09 DIAGNOSIS — E785 Hyperlipidemia, unspecified: Secondary | ICD-10-CM | POA: Diagnosis not present

## 2021-02-09 DIAGNOSIS — Z0000 Encounter for general adult medical examination without abnormal findings: Secondary | ICD-10-CM | POA: Diagnosis not present

## 2021-02-09 DIAGNOSIS — Z1211 Encounter for screening for malignant neoplasm of colon: Secondary | ICD-10-CM | POA: Diagnosis not present

## 2021-02-09 DIAGNOSIS — C4492 Squamous cell carcinoma of skin, unspecified: Secondary | ICD-10-CM | POA: Diagnosis not present

## 2021-02-09 DIAGNOSIS — G4733 Obstructive sleep apnea (adult) (pediatric): Secondary | ICD-10-CM | POA: Diagnosis not present

## 2021-02-09 DIAGNOSIS — I700 Atherosclerosis of aorta: Secondary | ICD-10-CM | POA: Diagnosis not present

## 2021-02-09 DIAGNOSIS — I5022 Chronic systolic (congestive) heart failure: Secondary | ICD-10-CM | POA: Diagnosis not present

## 2021-02-09 DIAGNOSIS — D6869 Other thrombophilia: Secondary | ICD-10-CM | POA: Diagnosis not present

## 2021-02-09 DIAGNOSIS — I4819 Other persistent atrial fibrillation: Secondary | ICD-10-CM

## 2021-02-09 MED ORDER — APIXABAN 5 MG PO TABS: 5.0000 mg | ORAL_TABLET | Freq: Two times a day (BID) | ORAL | 0 refills | Status: DC

## 2021-02-09 NOTE — Addendum Note (Signed)
Addended by: Westley Hummer B on: 02/09/2021 03:55 PM   Modules accepted: Orders

## 2021-02-09 NOTE — Progress Notes (Signed)
Established Patient Office Visit     This visit occurred during the SARS-CoV-2 public health emergency.  Safety protocols were in place, including screening questions prior to the visit, additional usage of staff PPE, and extensive cleaning of exam room while observing appropriate contact time as indicated for disinfecting solutions.    CC/Reason for Visit: Annual preventive exam and subsequent Medicare wellness visit  HPI: Shelly Bowers is a 70 y.o. female who is coming in today for the above mentioned reasons. Past Medical History is significant for: Hypertension, hyperlipidemia.  She has been recently dealing with A. fib.  She has had 2 DCCV, and ablation and is now on Eliquis and amiodarone.  She is being followed closely by cardiology.  She feels like she is doing better in the last month.  Her apple watch has not been alarming as frequently.  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  Since I last saw her she was also diagnosed with obstructive sleep apnea and will be going next month for CPAP fitting.  She fell on November 11 while wearing socks on a tile floor, struck the right side of her rib cage on a cushioned sofa.  She still has some mild pain but no visible bruising or shortness of breath.  She has routine eye and dental care.  She needs a clearance letter from [place] so that she can get her teeth cleaned.  She had her flu vaccine in October.  She is due for COVID booster, shingles and Tdap.  Her Cologuard will be due in January.  She is due for her mammogram next month.  She had a Pap smear in May 2022.   Past Medical/Surgical History: Past Medical History:  Diagnosis Date   Atrial fibrillation ([place])    Cardiomyopathy ([place])    HTN (hypertension)    Hyperlipidemia    Osteopenia     Past Surgical History:  Procedure Laterality Date   ATRIAL FIBRILLATION ABLATION N/A 11/21/2020   Procedure: ATRIAL FIBRILLATION ABLATION;  Surgeon:  Vickie Epley, MD;  Location: [facility];  Service: Cardiovascular;  Laterality: N/A;   AUGMENTATION MAMMAPLASTY Bilateral    BREAST EXCISIONAL BIOPSY Left    BUBBLE STUDY  08/25/2020   Procedure: BUBBLE STUDY;  Surgeon: Werner Lean, MD;  Location: [facility];  Service: Cardiovascular;;   CARDIOVERSION N/A 08/25/2020   Procedure: CARDIOVERSION;  Surgeon: Werner Lean, MD;  Location: [facility];  Service: Cardiovascular;  Laterality: N/A;   CARDIOVERSION N/A 12/26/2020   Procedure: CARDIOVERSION;  Surgeon: Werner Lean, MD;  Location: [facility];  Service: Cardiovascular;  Laterality: N/A;   INCISION AND DRAINAGE ABSCESS Left 12/27/2015   Procedure: INCISION AND DRAINAGE ABSCESS;  Surgeon: Daryll Brod, MD;  Location: [facility];  Service: Orthopedics;  Laterality: Left;   TEE WITHOUT CARDIOVERSION N/A 08/25/2020   Procedure: TRANSESOPHAGEAL ECHOCARDIOGRAM (TEE);  Surgeon: Werner Lean, MD;  [facility];  Service: Cardiovascular;  Laterality: N/A;    Social History:  reports that she has never smoked. She has never used smokeless tobacco. She reports current alcohol use of about 14.0 standard drinks per week. She reports that she does not use drugs.  Allergies: Allergies  Allergen Reactions   Codeine Nausea Only   Hydrocodone Nausea And Vomiting    Family History:  Family History  Problem Relation Age of Onset   Breast cancer Mother 53   Atrial  fibrillation Father      Current Outpatient Medications:    amiodarone (PACERONE) 200 MG tablet, Take 1 tablet (200 mg total) by mouth daily., Disp: 30 tablet, Rfl: 3   apixaban (ELIQUIS) 5 MG TABS tablet, Take 1 tablet (5 mg total) by mouth 2 (two) times daily., Disp: 180 tablet, Rfl: 3   atorvastatin (LIPITOR) 10 MG tablet, TAKE ONE TABLET BY MOUTH ONE TIME DAILY, Disp: 90 tablet, Rfl: 1   CALCIUM-VITAMIN D PO, Take 1 tablet by mouth daily., Disp: , Rfl:     celecoxib (CELEBREX) 200 MG capsule, TAKE 1 CAPSULE BY MOUTH 2 TIMES DAILY AS NEEDED, Disp: 90 capsule, Rfl: 1   cetirizine (ZYRTEC) 10 MG tablet, Take 10 mg by mouth daily., Disp: , Rfl:    cholecalciferol (VITAMIN D3) 25 MCG (1000 UNIT) tablet, Take 1,000 Units by mouth daily., Disp: , Rfl:    Cyanocobalamin (B-12 PO), Take 1,000 mcg by mouth daily., Disp: , Rfl:    folic acid (FOLVITE) 024 MCG tablet, Take 800 mcg by mouth daily., Disp: , Rfl:    furosemide (LASIX) 20 MG tablet, TAKE 1 TABLET (20 MG TOTAL) BY MOUTH DAILY AS NEEDED FOR EDEMA (WEIGHT GAIN)., Disp: 30 tablet, Rfl: 5   ibuprofen (ADVIL,MOTRIN) 200 MG tablet, Take 400 mg by mouth every 6 (six) hours as needed for headache, fever or mild pain., Disp: , Rfl:    metoprolol succinate (TOPROL XL) 25 MG 24 hr tablet, Take 0.5 tablets (12.5 mg total) by mouth daily., Disp: 30 tablet, Rfl: 11   metroNIDAZOLE (METROGEL) 0.75 % gel, Apply 1 application topically at bedtime., Disp: , Rfl:    naphazoline-pheniramine (VISINE) 0.025-0.3 % ophthalmic solution, Place 1 drop into both eyes daily as needed for eye irritation., Disp: , Rfl:    sacubitril-valsartan (ENTRESTO) 24-26 MG, Take 1 tablet by mouth 2 (two) times daily., Disp: 180 tablet, Rfl: 3   triamcinolone (NASACORT) 55 MCG/ACT AERO nasal inhaler, Place 2 sprays into the nose 2 (two) times daily as needed (Congestion)., Disp: , Rfl:    vitamin E 1000 UNIT capsule, Take 1,000 Units by mouth daily., Disp: , Rfl:   Review of Systems:  Constitutional: Denies fever, chills, diaphoresis, appetite change and fatigue.  HEENT: Denies photophobia, eye pain, redness, hearing loss, ear pain, congestion, sore throat, rhinorrhea, sneezing, mouth sores, trouble swallowing, neck pain, neck stiffness and tinnitus.   Respiratory: Denies SOB, DOE, cough, chest tightness,  and wheezing.   Cardiovascular: Denies chest pain, palpitations and leg swelling.  Gastrointestinal: Denies nausea, vomiting, abdominal  pain, diarrhea, constipation, blood in stool and abdominal distention.  Genitourinary: Denies dysuria, urgency, frequency, hematuria, flank pain and difficulty urinating.  Endocrine: Denies: hot or cold intolerance, sweats, changes in hair or nails, polyuria, polydipsia. Musculoskeletal: Denies myalgias, back pain, joint swelling, arthralgias and gait problem.  Skin: Denies pallor, rash and wound.  Neurological: Denies dizziness, seizures, syncope, weakness, light-headedness, numbness and headaches.  Hematological: Denies adenopathy. Easy bruising, personal or family bleeding history  Psychiatric/Behavioral: Denies suicidal ideation, mood changes, confusion, nervousness, sleep disturbance and agitation    Physical Exam: Vitals:   02/09/21 1301  BP: 130/80  Pulse: (!) 50  Temp: 98 F (36.7 C)  TempSrc: Oral  SpO2: 98%  Weight: 173 lb 14.4 oz (78.9 kg)  Height: 5\' 5"  (1.651 m)    Body mass index is 28.94 kg/m.   Constitutional: NAD, calm, comfortable Eyes: PERRL, lids and conjunctivae normal. ENMT: Mucous membranes are moist. Posterior pharynx clear of  any exudate or lesions. Normal dentition. Tympanic membrane is pearly white, no erythema or bulging. Neck: normal, supple, no masses, no thyromegaly Respiratory: clear to auscultation bilaterally, no wheezing, no crackles. Normal respiratory effort. No accessory muscle use.  Cardiovascular: Regular rate and rhythm, no murmurs / rubs / gallops. No extremity edema. 2+ pedal pulses. No carotid bruits.  Abdomen: no tenderness, no masses palpated. No hepatosplenomegaly. Bowel sounds positive.  Musculoskeletal: no clubbing / cyanosis. No joint deformity upper and lower extremities. Good ROM, no contractures. Normal muscle tone.  Skin: no rashes, lesions, ulcers. No induration Neurologic: CN 2-12 grossly intact. Sensation intact, DTR normal. Strength 5/5 in all 4.  Psychiatric: Normal judgment and insight. Alert and oriented x 3. Normal  mood.    Subsequent Medicare wellness visit   1. Risk factors, based on past  M,S,F -cardiovascular disease risk factors include age, history of hypertension and hyperlipidemia   2.  Physical activities: She stays active with activities of daily living   3.  Depression/mood: Stable, not depressed   4.  Hearing: No perceived issues   5.  ADL's: Independent in all ADLs   6.  Fall risk: Low fall risk   7.  Home safety: No problems identified   8.  Height weight, and visual acuity: height and weight as above, vision:  Vision Screening   Right eye Left eye Both eyes  Without correction     With correction 20/20 20/25 20/20      9.  Counseling: Advise she update her vaccination status   10. Lab orders based on risk factors: Laboratory update will be reviewed   11. Referral : None today   12. Care plan: Follow-up with me in 6 months   13. Cognitive assessment: No cognitive impairment   14. Screening: Patient provided with a written and personalized 5-10 year screening schedule in the AVS. yes   15. Provider List Update: PCP, cardiology Dr. Percival Spanish and Dr. Quentin Ore.  16. Advance Directives: Full code   17. Opioids: Patient is not on any opioid prescriptions and has no risk factors for a substance use disorder.   [place] Office Visit from 02/09/2021 in [facility]  PHQ-9 Total Score 0       Fall Risk 08/25/2020 08/25/2020 08/26/2020 12/26/2020 02/09/2021  Falls in the past year? - - - - 1  Was there an injury with Fall? - - - - 1  Fall Risk Category Calculator - - - - 2  Fall Risk Category - - - - Moderate  Patient Fall Risk Level Moderate fall risk Moderate fall risk Moderate fall risk Low fall risk -  Patient at Risk for Falls Due to - - - - -     Impression and Plan:  Screening for malignant neoplasm of colon  -  Plan: Cologuard  Encounter for preventive health examination -Recommend routine eye and dental care. -Immunizations: She is  overdue for COVID booster, Tdap, second shingles I will get these at her pharmacy. -Healthy lifestyle discussed in detail. -Labs to be updated today. -Colon cancer screening: Due for Cologuard in January 2023 -Breast cancer screening: Last in December 2021, has it scheduled for next month. -Cervical cancer screening: May/2022 -Lung cancer screening: Not applicable -Prostate cancer screening: Not applicable -DEXA: [id number].  Persistent atrial fibrillation (HCC)  Secondary hypercoagulable state ([place])  Dyslipidemia  Essential hypertension  SCC (squamous cell carcinoma)  Hyperlipidemia, unspecified hyperlipidemia type  Chronic systolic CHF (congestive heart failure) ([place]) - Plan: CBC with  Differential/Platelet, Comprehensive metabolic panel, Hemoglobin A1c, Lipid panel, TSH, Vitamin B12, VITAMIN D 25 Hydroxy (Vit-D Deficiency, Fractures)  Aortic atherosclerosis (HCC)  OSA (obstructive sleep apnea)  -She will be fitted with her CPAP machine soon. -She is followed closely by cardiology.  She is on atorvastatin 10 mg daily, suspect we may need to increase dose pending results of lipid panel as her last LDL was 125 in 2021.    Patient Instructions  -Nice seeing you today!!  -Lab work today; will notify you once results are available.  -Remember your COVID booster, tdap and second shingles vaccines at the pharmacy.  -Schedule follow up in 6 months or sooner as needed.     Lelon Frohlich, MD [facility] at [facility]

## 2021-02-09 NOTE — Patient Instructions (Signed)
-  Nice seeing you today!!  -Lab work today; will notify you once results are available.  -Remember your COVID booster, tdap and second shingles vaccines at the pharmacy.  -Schedule follow up in 6 months or sooner as needed.

## 2021-02-10 ENCOUNTER — Other Ambulatory Visit (INDEPENDENT_AMBULATORY_CARE_PROVIDER_SITE_OTHER): Payer: Medicare HMO

## 2021-02-10 DIAGNOSIS — I5022 Chronic systolic (congestive) heart failure: Secondary | ICD-10-CM

## 2021-02-10 LAB — LIPID PANEL
Cholesterol: 201 mg/dL — ABNORMAL HIGH (ref 0–200)
HDL: 65.3 mg/dL (ref >39.00)
LDL Cholesterol: 116 mg/dL — ABNORMAL HIGH (ref 0–99)
NonHDL: 135.8
Total CHOL/HDL Ratio: 3
Triglycerides: 97 mg/dL (ref 0.0–149.0)
VLDL: 19.4 mg/dL (ref 0.0–40.0)

## 2021-02-10 LAB — COMPREHENSIVE METABOLIC PANEL
ALT: 11 U/L (ref 0–35)
AST: 16 U/L (ref 0–37)
Albumin: 4.4 g/dL (ref 3.5–5.2)
Alkaline Phosphatase: 80 U/L (ref 39–117)
BUN: 14 mg/dL (ref 6–23)
CO2: 28 mEq/L (ref 19–32)
Calcium: 9.5 mg/dL (ref 8.4–10.5)
Chloride: 103 mEq/L (ref 96–112)
Creatinine, Ser: 0.9 mg/dL (ref 0.40–1.20)
GFR: 63.72 mL/min (ref >60.00)
Glucose, Bld: 95 mg/dL (ref 70–99)
Potassium: 4.8 mEq/L (ref 3.5–5.1)
Sodium: 138 mEq/L (ref 135–145)
Total Bilirubin: 0.7 mg/dL (ref 0.2–1.2)
Total Protein: 6.7 g/dL (ref 6.0–8.3)

## 2021-02-10 LAB — CBC WITH DIFFERENTIAL/PLATELET
Basophils Absolute: 0 10*3/uL (ref 0.0–0.1)
Basophils Relative: 0.8 % (ref 0.0–3.0)
Eosinophils Absolute: 0.2 10*3/uL (ref 0.0–0.7)
Eosinophils Relative: 3.5 % (ref 0.0–5.0)
HCT: 37.4 % (ref 36.0–46.0)
Hemoglobin: 12.5 g/dL (ref 12.0–15.0)
Lymphocytes Relative: 30 % (ref 12.0–46.0)
Lymphs Abs: 1.3 10*3/uL (ref 0.7–4.0)
MCHC: 33.4 g/dL (ref 30.0–36.0)
MCV: 100.2 fl — ABNORMAL HIGH (ref 78.0–100.0)
Monocytes Absolute: 0.4 10*3/uL (ref 0.1–1.0)
Monocytes Relative: 10.1 % (ref 3.0–12.0)
Neutro Abs: 2.4 10*3/uL (ref 1.4–7.7)
Neutrophils Relative %: 55.6 % (ref 43.0–77.0)
Platelets: 173 10*3/uL (ref 150.0–400.0)
RBC: 3.73 Mil/uL — ABNORMAL LOW (ref 3.87–5.11)
RDW: 13.9 % (ref 11.5–15.5)
WBC: 4.4 10*3/uL (ref 4.0–10.5)

## 2021-02-10 LAB — VITAMIN D 25 HYDROXY (VIT D DEFICIENCY, FRACTURES): VITD: 41.17 ng/mL (ref 30.00–100.00)

## 2021-02-10 LAB — VITAMIN B12: Vitamin B-12: 881 pg/mL (ref 211–911)

## 2021-02-10 LAB — TSH: TSH: 5.81 u[IU]/mL — ABNORMAL HIGH (ref 0.35–5.50)

## 2021-02-10 LAB — HEMOGLOBIN A1C: Hgb A1c MFr Bld: 5.7 % (ref 4.6–6.5)

## 2021-02-11 ENCOUNTER — Other Ambulatory Visit: Payer: Self-pay | Admitting: Internal Medicine

## 2021-02-11 DIAGNOSIS — E785 Hyperlipidemia, unspecified: Secondary | ICD-10-CM

## 2021-02-13 ENCOUNTER — Telehealth: Payer: Self-pay | Admitting: Internal Medicine

## 2021-02-13 ENCOUNTER — Other Ambulatory Visit: Payer: Medicare HMO

## 2021-02-13 DIAGNOSIS — R946 Abnormal results of thyroid function studies: Secondary | ICD-10-CM

## 2021-02-13 NOTE — Telephone Encounter (Signed)
Letter from 02/09/2021 was printed and faxed to Dr Leonides Schanz at the number below.

## 2021-02-13 NOTE — Telephone Encounter (Signed)
Pt call and stated she need the letter fax to her dentist Robb Matar at [phone]

## 2021-02-14 ENCOUNTER — Other Ambulatory Visit: Payer: Self-pay | Admitting: Internal Medicine

## 2021-02-14 DIAGNOSIS — E039 Hypothyroidism, unspecified: Secondary | ICD-10-CM

## 2021-02-14 LAB — T3, FREE: T3, Free: 2.4 pg/mL (ref 2.3–4.2)

## 2021-02-14 LAB — T4, FREE: Free T4: 1.6 ng/dL (ref 0.8–1.8)

## 2021-02-15 ENCOUNTER — Other Ambulatory Visit (INDEPENDENT_AMBULATORY_CARE_PROVIDER_SITE_OTHER): Payer: Medicare HMO

## 2021-02-15 ENCOUNTER — Other Ambulatory Visit: Payer: Medicare HMO

## 2021-02-15 DIAGNOSIS — E039 Hypothyroidism, unspecified: Secondary | ICD-10-CM

## 2021-02-15 LAB — T4, FREE: Free T4: 0.83 ng/dL (ref 0.60–1.60)

## 2021-02-15 LAB — T3, FREE: T3, Free: 3.2 pg/mL (ref 2.3–4.2)

## 2021-02-24 ENCOUNTER — Other Ambulatory Visit: Payer: Self-pay | Admitting: Internal Medicine

## 2021-02-24 DIAGNOSIS — E039 Hypothyroidism, unspecified: Secondary | ICD-10-CM

## 2021-02-27 DIAGNOSIS — Z1211 Encounter for screening for malignant neoplasm of colon: Secondary | ICD-10-CM | POA: Diagnosis not present

## 2021-02-28 NOTE — Progress Notes (Signed)
Electrophysiology Office Follow up Visit Note:    Date:  03/01/2021   ID:  Shelly Bowers, DOB 10/22/1950, MRN [id number]  PCP:  Isaac Bliss, Rayford Halsted, MD  [facility] Cardiologist:  Minus Breeding, MD  [facility] Electrophysiologist:  Vickie Epley, MD    Interval History:    Shelly Bowers is a 70 y.o. female who presents for a follow up visit for her persistent atrial fibrillation.  She underwent an A. fib ablation on November 21, 2020.  During the procedure, the pulmonary veins and posterior wall were isolated.  There was complex anatomy on the right pulmonary veins including right middle pulmonary vein that was difficult to isolate.  She had a cardioversion on December 26, 2020 with early return of atrial fibrillation.  She was started on amiodarone and successfully cardioverted.  She takes 200 mg by mouth once daily.  She is on Eliquis 5 mg by mouth twice daily.  She is with her husband today who I have previously met.  She is doing well now that she is in normal rhythm.  She has increased exercise tolerance.  She is interested in getting back to exercising and inquiring about exercise classes.     Past Medical History:  Diagnosis Date   Atrial fibrillation ([place])    Cardiomyopathy ([place])    HTN (hypertension)    Hyperlipidemia    Osteopenia     Past Surgical History:  Procedure Laterality Date   ATRIAL FIBRILLATION ABLATION N/A 11/21/2020   Procedure: ATRIAL FIBRILLATION ABLATION;  Surgeon: Vickie Epley, MD;  Location: [facility];  Service: Cardiovascular;  Laterality: N/A;   AUGMENTATION MAMMAPLASTY Bilateral    BREAST EXCISIONAL BIOPSY Left    BUBBLE STUDY  08/25/2020   Procedure: BUBBLE STUDY;  Surgeon: Werner Lean, MD;  Location: [facility];  Service: Cardiovascular;;   CARDIOVERSION N/A 08/25/2020   Procedure: CARDIOVERSION;  Surgeon: Werner Lean, MD;  Location: [facility];  Service: Cardiovascular;  Laterality:  N/A;   CARDIOVERSION N/A 12/26/2020   Procedure: CARDIOVERSION;  Surgeon: Werner Lean, MD;  Location: [facility];  Service: Cardiovascular;  Laterality: N/A;   INCISION AND DRAINAGE ABSCESS Left 12/27/2015   Procedure: INCISION AND DRAINAGE ABSCESS;  Surgeon: Daryll Brod, MD;  Location: [facility];  Service: Orthopedics;  Laterality: Left;   TEE WITHOUT CARDIOVERSION N/A 08/25/2020   Procedure: TRANSESOPHAGEAL ECHOCARDIOGRAM (TEE);  Surgeon: Werner Lean, MD;  Location: [facility];  Service: Cardiovascular;  Laterality: N/A;    Current Medications: Current Meds  Medication Sig   amiodarone (PACERONE) 200 MG tablet Take 1 tablet (200 mg total) by mouth daily.   apixaban (ELIQUIS) 5 MG TABS tablet Take 1 tablet (5 mg total) by mouth 2 (two) times daily.   atorvastatin (LIPITOR) 10 MG tablet TAKE ONE TABLET BY MOUTH ONE TIME DAILY   CALCIUM-VITAMIN D PO Take 1 tablet by mouth daily.   celecoxib (CELEBREX) 200 MG capsule TAKE 1 CAPSULE BY MOUTH 2 TIMES DAILY AS NEEDED   cetirizine (ZYRTEC) 10 MG tablet Take 10 mg by mouth daily.   cholecalciferol (VITAMIN D3) 25 MCG (1000 UNIT) tablet Take 1,000 Units by mouth daily.   Cyanocobalamin (B-12 PO) Take 1,000 mcg by mouth daily.   folic acid (FOLVITE) 165 MCG tablet Take 800 mcg by mouth daily.   furosemide (LASIX) 20 MG tablet TAKE 1 TABLET (20 MG TOTAL) BY MOUTH DAILY AS NEEDED FOR EDEMA (WEIGHT GAIN).   ibuprofen (ADVIL,MOTRIN)  200 MG tablet Take 400 mg by mouth every 6 (six) hours as needed for headache, fever or mild pain.   metoprolol succinate (TOPROL XL) 25 MG 24 hr tablet Take 0.5 tablets (12.5 mg total) by mouth daily.   metroNIDAZOLE (METROGEL) 0.75 % gel Apply 1 application topically at bedtime.   naphazoline-pheniramine (VISINE) 0.025-0.3 % ophthalmic solution Place 1 drop into both eyes daily as needed for eye irritation.   sacubitril-valsartan (ENTRESTO) 24-26 MG Take 1 tablet by mouth 2 (two)  times daily.   triamcinolone (NASACORT) 55 MCG/ACT AERO nasal inhaler Place 2 sprays into the nose 2 (two) times daily as needed (Congestion).   vitamin E 1000 UNIT capsule Take 1,000 Units by mouth daily.     Allergies:   Codeine and Hydrocodone   Social History   Socioeconomic History   Marital status: Married    Spouse name: Not on file   Number of children: Not on file   Years of education: Not on file   Highest education level: Not on file  Occupational History   Not on file  Tobacco Use   Smoking status: Never   Smokeless tobacco: Never  Substance and Sexual Activity   Alcohol use: Yes    Alcohol/week: 14.0 standard drinks    Types: 14 Glasses of wine per week    Comment: 1 glass of wine nightly   Drug use: No   Sexual activity: Not on file  Other Topics Concern   Not on file  Social History Narrative   Left handed   Caffeine use: tea sometimes   Soda-zero sugar/no caffeine ginger ale   1-2 cups coffee per day (decaf)   Social Determinants of Health   Financial Resource Strain: Not on file  Food Insecurity: Not on file  Transportation Needs: Not on file  Physical Activity: Not on file  Stress: Not on file  Social Connections: Not on file     Family History: The patient's family history includes Atrial fibrillation in her father; Breast cancer (age of onset: 27) in her mother.  ROS:   Please see the history of present illness.    All other systems reviewed and are negative.  EKGs/Labs/Other Studies Reviewed:    The following studies were reviewed today:  February 10, 2021 lab work AST 16, ALT 11, TSH 5.8 (elevated), free T4 0.8 (normal)  EKG:  The ekg ordered today demonstrates sinus rhythm.  Intervals are normal.  Ventricular rate of 56 bpm.  Recent Labs: 08/23/2020: B Natriuretic Peptide 370.5 09/01/2020: Magnesium 2.0 02/10/2021: ALT 11; BUN 14; Creatinine, Ser 0.90; Hemoglobin 12.5; Platelets 173.0; Potassium 4.8; Sodium 138; TSH 5.81  Recent  Lipid Panel    Component Value Date/Time   CHOL 201 (H) 02/10/2021 1002   TRIG 97.0 02/10/2021 1002   HDL 65.30 02/10/2021 1002   CHOLHDL 3 02/10/2021 1002   VLDL 19.4 02/10/2021 1002   LDLCALC 116 (H) 02/10/2021 1002   LDLCALC 125 (H) 01/22/2020 1115   LDLDIRECT 191.9 04/14/2013 0856    Physical Exam:    VS:  BP 114/68   Pulse (!) 56   Ht _0  (1.676 m)   Wt 174 lb 3.2 oz (79 kg)   SpO2 98%   BMI 28.12 kg/m     Wt Readings from Last 3 Encounters:  03/01/21 174 lb 3.2 oz (79 kg)  02/09/21 173 lb 14.4 oz (78.9 kg)  01/09/21 174 lb 3.2 oz (79 kg)     GEN:  Well nourished,  well developed in no acute distress HEENT: Normal NECK: No JVD; No carotid bruits LYMPHATICS: No lymphadenopathy CARDIAC: RRR, no murmurs, rubs, gallops RESPIRATORY:  Clear to auscultation without rales, wheezing or rhonchi  ABDOMEN: Soft, non-tender, non-distended MUSCULOSKELETAL:  No edema; No deformity  SKIN: Warm and dry NEUROLOGIC:  Alert and oriented x 3 PSYCHIATRIC:  Normal affect        ASSESSMENT:    1. Persistent atrial fibrillation ([place])   2. Chronic systolic heart failure ([place])   3. Primary hypertension    PLAN:    In order of problems listed above:  #Persistent atrial fibrillation Post ablation in August 2022.  Had some A. fib after the ablation and was successfully cardioverted while on amiodarone.  She is maintaining sinus rhythm since that point.  For now, we will plan to continue the amiodarone for at least the next 6 months.  I will have her follow-up in 3 months with an APP for blood work for amiodarone monitoring.  When I see her back in 6 months I will plan to discontinue the amiodarone if she is maintaining normal rhythm.  If she has a recurrence of A. fib after that point, would plan for redo ablation.  In the meantime, continue Eliquis for stroke prophylaxis.  #Chronic systolic heart failure NYHA class II today.  Warm and dry on exam.  Continue metoprolol, Lasix,  Entresto.  #Hypertension Controlled Continue current regimen   Follow-up in 3 months with APP with blood work (CMP, TSH and free T4) at that visit Follow-up with me in 6 months.  Plan to stop amiodarone at that visit Follow-up with me in 1 year.  If recurrence of atrial fibrillation, consider redo ablation     Medication Adjustments/Labs and Tests Ordered: Current medicines are reviewed at length with the patient today.  Concerns regarding medicines are outlined above.  Orders Placed This Encounter  Procedures   EKG 12-Lead    No orders of the defined types were placed in this encounter.    Signed, Lars Mage, MD, [facility], [facility] 03/01/2021 2:26 PM    Electrophysiology [facility]

## 2021-03-01 ENCOUNTER — Other Ambulatory Visit: Payer: Self-pay

## 2021-03-01 ENCOUNTER — Ambulatory Visit: Payer: Medicare HMO | Admitting: Cardiology

## 2021-03-01 ENCOUNTER — Encounter: Payer: Self-pay | Admitting: Cardiology

## 2021-03-01 VITALS — BP 114/68 | HR 56 | Ht 66.0 in | Wt 174.2 lb

## 2021-03-01 DIAGNOSIS — I4819 Other persistent atrial fibrillation: Secondary | ICD-10-CM

## 2021-03-01 DIAGNOSIS — I10 Essential (primary) hypertension: Secondary | ICD-10-CM

## 2021-03-01 DIAGNOSIS — I5022 Chronic systolic (congestive) heart failure: Secondary | ICD-10-CM

## 2021-03-01 NOTE — Patient Instructions (Addendum)
Medication Instructions:  Your physician recommends that you continue on your current medications as directed. Please refer to the Current Medication list given to you today. *If you need a refill on your cardiac medications before your next appointment, please call your pharmacy*  Lab Work: None ordered. If you have labs (blood work) drawn today and your tests are completely normal, you will receive your results only by: [place] (if you have MyChart) OR A paper copy in the mail If you have any lab test that is abnormal or we need to change your treatment, we will call you to review the results.  Testing/Procedures: None ordered.  Follow-Up: At [facility], you and your health needs are our priority.  As part of our continuing mission to provide you with exceptional heart care, we have created designated Provider Care Teams.  These Care Teams include your primary Cardiologist (physician) and Advanced Practice Providers (APPs -  Physician Assistants and Nurse Practitioners) who all work together to provide you with the care you need, when you need it.  Your next appointment:    Your physician wants you to follow-up in: 3 months with Jonni Sanger Tillery/Renee Charlcie Cradle.  Your physician wants you to follow-up in: 6 months with Vickie Epley, MD.  Dennis Bast will receive a reminder letter in the mail two months in advance. If you don't receive a letter, please call our office to schedule the follow-up appointment.

## 2021-03-03 DIAGNOSIS — G4733 Obstructive sleep apnea (adult) (pediatric): Secondary | ICD-10-CM | POA: Diagnosis not present

## 2021-03-03 LAB — COLOGUARD: COLOGUARD: NEGATIVE

## 2021-03-06 ENCOUNTER — Other Ambulatory Visit: Payer: Self-pay | Admitting: Neurology

## 2021-03-23 ENCOUNTER — Telehealth: Payer: Self-pay | Admitting: Neurology

## 2021-03-23 NOTE — Telephone Encounter (Signed)
Pt called wanting to discuss the humidity level for her cpap machine with RN.

## 2021-03-23 NOTE — Telephone Encounter (Signed)
Called the patient back. She has a current setting of 3 cm  and although the dry mouth wasn't as bad last night it has continued to be an issue and she is not sleeping well. She tried contacting the DME company and no one has called her back. Advised I would reach out to the DME company management and make sure someone calls her.  In the meantime I recommended keeping the humidification at 3 cm and add a ice cube to the container to see if that helps with the moisture. She will try that. She will also reach out to me if no one has contacted her by 4:30 today.

## 2021-03-24 DIAGNOSIS — G4733 Obstructive sleep apnea (adult) (pediatric): Secondary | ICD-10-CM | POA: Diagnosis not present

## 2021-03-28 ENCOUNTER — Other Ambulatory Visit: Payer: Self-pay | Admitting: Internal Medicine

## 2021-03-28 DIAGNOSIS — Z1231 Encounter for screening mammogram for malignant neoplasm of breast: Secondary | ICD-10-CM

## 2021-04-03 DIAGNOSIS — G4733 Obstructive sleep apnea (adult) (pediatric): Secondary | ICD-10-CM | POA: Diagnosis not present

## 2021-04-11 ENCOUNTER — Ambulatory Visit: Payer: Medicare HMO | Admitting: Neurology

## 2021-04-11 ENCOUNTER — Encounter: Payer: Self-pay | Admitting: Neurology

## 2021-04-11 ENCOUNTER — Other Ambulatory Visit: Payer: Self-pay

## 2021-04-11 VITALS — BP 134/67 | HR 58 | Ht 66.0 in | Wt 180.5 lb

## 2021-04-11 DIAGNOSIS — R351 Nocturia: Secondary | ICD-10-CM | POA: Diagnosis not present

## 2021-04-11 DIAGNOSIS — I4891 Unspecified atrial fibrillation: Secondary | ICD-10-CM | POA: Diagnosis not present

## 2021-04-11 DIAGNOSIS — I420 Dilated cardiomyopathy: Secondary | ICD-10-CM

## 2021-04-11 DIAGNOSIS — I5021 Acute systolic (congestive) heart failure: Secondary | ICD-10-CM

## 2021-04-11 DIAGNOSIS — R0683 Snoring: Secondary | ICD-10-CM

## 2021-04-11 DIAGNOSIS — G4733 Obstructive sleep apnea (adult) (pediatric): Secondary | ICD-10-CM

## 2021-04-11 NOTE — Progress Notes (Signed)
SLEEP MEDICINE CLINIC    Provider:  Larey Seat, MD  Primary Care Physician:  Shelly Bowers, Shelly Halsted, MD [place] [address]     Referring Provider: Isaac Bowers, Shelly Bowers, [place],  [place] [address]          Chief Complaint according to patient   Patient presents with:     New Patient (Initial Visit)           HISTORY OF PRESENT ILLNESS:  Shelly Bowers is a 71 y.o.  Caucasian female patient seen here in her forst RV since  sleep testing. She has been undergoing cardioversion, which failed , then ablation and then another cardioversion. She now is rate controlled and had only 2 brief spells of SVT. She remains anticoagulated by Intresto.  She underwent sleep testing: The home sleep test was performed on 9-19 -2022 before the patient had some additional SVT encounters.  She had endorsed the sleepiness scale at 7 points out of 24, her BMI was 29 at the time and has remained the same her neck circumference was measured at 15 inches.  She slept for 8 hours and 11 minutes with the percentage of 17.4% REM sleep.  The calculated overall AHI was 32.9 which is considered severe her RDI was slightly higher which indicates that snoring was present the minority of her apneas were central in nature 17 central apneas were noted and during supine sleep there was an increase in apnea to an AHI of 48.3 while she only produced an AHI of 20/h when sleeping on her side.  Snoring was highly elevated by volume of the mean volume was 44 dB and the patient snored for about 47% of the total sleep time.  The sleep study was performed after ablation on 11-21-2020. There was another cardioversion performed after ablation, dated 12-26-2020.  She still goes 2-3 times to the bathroom at night.  She uses nasal pillows , has a parched mouth, started using chin strap- mouth tape.  Biotin mouthwash is used.  She is getting used to the machine.    Compliance is excellent.  Last night we used it.  For the mouth it was no progress on this report.  Reported almost 9 days older so that it of the leak is on some nights meaning no and on some nights Apnea machine is mild mild mask needed testing with residual apnea index stays low overall - really good.  But I do not think she could ever get up with a dental device or with the inspire .   Consultation upon a  referral on 09-01-2020 from PCP, for  recent onset of atrial fibrillation.   Shelly Bowers was so last seen by Dr. Jerilynn Bowers ---Shelly Bowers on 6-9 2022.  She has a history of hypertension hyperlipidemia and is overweight.  On Memorial Day May 2022 she developed shortness of breath and a cough and was concerned that she may have contracted COVID-19 , had a rattling cough at night, felt miserable. so on Tuesday that weekend she scheduled a [facility].  While there she became short of breath again and her heart rate reached 170 bpm she was sent to the hospital directly for evaluation and was found to be in new onset atrial fibrillation with rapid ventricular response.  She was admitted on 5-31 and discharged 4 days later she had a transesophageal echo DCCV on 3 June was found  to have a new heart failure with reduced ejection fraction of 30 to 35% by echocardiogram.  I am not sure if this was a diastolic heart failure related to atrial fibrillation.  She was started on metoprolol 25 mg for rate control.  She was also anticoagulated on Eliquis.  After hospital discharge her apple watch has alarmed her several times to have had rapid heart rates and the on-call cardiologist recommended increasing the metoprolol to 25 twice daily.  She has been relatively well on this medication she is no longer short of breath.  But she has noted difficulty sleeping, as she wakes up frequently through the night and does have to urinate more frequently than she used to.  She also is on Lasix 20 mg which may contribute  to her nocturnal bathroom breaks.  She takes Nasacort as needed for nasal allergies.  She has been on prescription vitamin D supplement.  Recent blood pressures have been in normal range systolic between 676H and 120 and her heart rate is not bradycardic 45 to 50 bpm.   ShellyHochrein is her cardiologist. by estimation, is 30 to 35%. Left ventricular ejection fraction by 3D volume is 30 %. The left ventricle has moderately decreased function. The left ventricle demonstrates global hypokinesis. 2. The mitral valve is degenerative. Mild mitral valve regurgitation with atrial functional regurgitation and two jets: A1-P1 and A3-P3. 3. Left atrial size was moderately dilated. No left atrial/left atrial appendage thrombus was detected. 4. Evidence of atrial level shunting detected by color flow Doppler. Agitated saline contrast bubble study was positive with shunting observed within 3-6 cardiac cycles suggestive of interatrial shunt. There is a small patent foramen ovale. 5. Right ventricular systolic function is mildly to moderately reduced. The right ventricular size is normal. 6. Right atrial size was mildly dilated.  Chief concern according to patient :  atrial fibrillation ,recurrent tachycardia - per apple watch. Currently asymptomatic.    I have the pleasure of seeing Shelly Bowers today, a right -handed Caucasian female with a possible sleep disorder.  She has a  has a past medical history of Atrial fibrillation ([place]), Cardiomyopathy ([place]), HTN (hypertension), Hyperlipidemia, and Osteopenia..   The patient has not had sleep study . Sleep relevant medical history: Nocturia/ Coughing at night is improved. Atrial fib asymptomatic March - June  by apple watch.  Family medical /sleep history: no other family member on CPAP with OSA, insomnia, sleep walkers.    Social history:  Patient is working part time at [employer].and lives in a household with persons husband- one dog.  Tobacco  use/ n-a .  ETOH use - quit in June 2022,  Caffeine intake in form of Coffee(  decaffeinated ) Soda( /) Tea ( /) or energy drinks. Regular exercise in form of walking.  Hobbies : Landscape architect.    Sleep habits are as follows: The patient's dinner time is between 7.30 PM. The patient goes to bed at 10 PM and continues to sleep for 8 hours, wakes for 3-4 bathroom breaks.   The preferred sleep position is sideways ,  right , with the support of 1 pillows.  Dreams are reportedly frequent/vivid.  10  AM is the usual rise time. The patient wakes up spontaneously.  She  reports not feeling refreshed or restored in AM, with symptoms such as dry mouth, and residual fatigue. Naps are taken frequently, lasting from 30 to 60 minutes and are more refreshing than nocturnal sleep.    Review  of Systems: Out of a complete 14 system review, the patient complains of only the following symptoms, and all other reviewed systems are negative.:  Fatigue, sleepiness , snoring, fragmented sleep, Insomnia -NOCTURIA.    How likely are you to doze in the following situations: 0 = not likely, 1 = slight chance, 2 = moderate chance, 3 = high chance   Sitting and Reading? Watching Television? Sitting inactive in a public place (theater or meeting)? As a passenger in a car for an hour without a break? Lying down in the afternoon when circumstances permit? Sitting and talking to someone? Sitting quietly after lunch without alcohol? In a car, while stopped for a few minutes in traffic?   Total = 7/ 24 points   FSS endorsed at 34/ 63 points.   6/6/-2022: Paged by operator that patient's HR is 146.  Recent discharge on 08/26/20 for AF/RVR and underwent TEE/DCCV (08/25/20). She was started on eliquis/entresto and metoprolol. Dr. Percival Spanish saw her on 08/26/20 after successful DCCV. EF 30-35% with global HK on TTE on 08/24/20 with moderate LA dilation.    Patient said she was watching TV and her apple watch alarmed  that her HR was 157 for the prior 10 minutes. Currently at HR of 95. Lowest it was today was 56, high of 157. SOB is improved since discharge  In retrospect she might feel a little when she lies on her left side but she really does not notice that she is particular out of rhythm.  She is not having any shortness of breath, PND or orthopnea.  She is not having any palpitations, presyncope or syncope.  She does drink a couple glasses of wine each night and wonders if that could have been a trigger.  Of note in the hospital she did have bradycardia post cardioversion and her QT interval was prolonged on that EKG.  Her heart rate in fibrillation at home has been frequently above 110.  She did have her dose of beta-blocker increased slightly.  She has not had any problems with her blood thinner.  She had no new shortness of breath, PND or orthopnea.  She has had no increased lower extremity swelling.   Social History   Socioeconomic History   Marital status: Married    Spouse name: Not on file   Number of children: Not on file   Years of education: Not on file   Highest education level: Not on file  Occupational History   Not on file  Tobacco Use   Smoking status: Never   Smokeless tobacco: Never  Substance and Sexual Activity   Alcohol use: Yes    Alcohol/week: 14.0 standard drinks    Types: 14 Glasses of wine per week    Comment: 1 glass of wine nightly   Drug use: No   Sexual activity: Not on file  Other Topics Concern   Not on file  Social History Narrative   Left handed   Caffeine use: tea sometimes   Soda-zero sugar/no caffeine ginger ale   1-2 cups coffee per day (decaf)   Social Determinants of Health   Financial Resource Strain: Not on file  Food Insecurity: Not on file  Transportation Needs: Not on file  Physical Activity: Not on file  Stress: Not on file  Social Connections: Not on file    Family History  Problem Relation Age of Onset   Breast cancer Mother 61    Atrial fibrillation Father     Past Medical History:  Diagnosis Date   Atrial fibrillation ([place])    Cardiomyopathy ([place])    HTN (hypertension)    Hyperlipidemia    Osteopenia     Past Surgical History:  Procedure Laterality Date   ATRIAL FIBRILLATION ABLATION N/A 11/21/2020   Procedure: ATRIAL FIBRILLATION ABLATION;  Surgeon: Vickie Epley, MD;  Location: [facility];  Service: Cardiovascular;  Laterality: N/A;   AUGMENTATION MAMMAPLASTY Bilateral    BREAST EXCISIONAL BIOPSY Left    BUBBLE STUDY  08/25/2020   Procedure: BUBBLE STUDY;  Surgeon: Werner Lean, MD;  Location: [facility];  Service: Cardiovascular;;   CARDIOVERSION N/A 08/25/2020   Procedure: CARDIOVERSION;  Surgeon: Werner Lean, MD;  Location: [facility];  Service: Cardiovascular;  Laterality: N/A;   CARDIOVERSION N/A 12/26/2020   Procedure: CARDIOVERSION;  Surgeon: Werner Lean, MD;  Location: [facility];  Service: Cardiovascular;  Laterality: N/A;   INCISION AND DRAINAGE ABSCESS Left 12/27/2015   Procedure: INCISION AND DRAINAGE ABSCESS;  Surgeon: Daryll Brod, MD;  Location: [facility];  Service: Orthopedics;  Laterality: Left;   TEE WITHOUT CARDIOVERSION N/A 08/25/2020   Procedure: TRANSESOPHAGEAL ECHOCARDIOGRAM (TEE);  Surgeon: Werner Lean, MD;  Location: [facility];  Service: Cardiovascular;  Laterality: N/A;     Current Outpatient Medications on File Prior to Visit  Medication Sig Dispense Refill   amiodarone (PACERONE) 200 MG tablet Take 1 tablet (200 mg total) by mouth daily. 30 tablet 3   apixaban (ELIQUIS) 5 MG TABS tablet Take 1 tablet (5 mg total) by mouth 2 (two) times daily. 180 tablet 0   atorvastatin (LIPITOR) 10 MG tablet TAKE ONE TABLET BY MOUTH ONE TIME DAILY 90 tablet 0   CALCIUM-VITAMIN D PO Take 1 tablet by mouth daily.     celecoxib (CELEBREX) 200 MG capsule TAKE 1 CAPSULE BY MOUTH 2 TIMES DAILY AS NEEDED 90 capsule 1    cetirizine (ZYRTEC) 10 MG tablet Take 10 mg by mouth daily.     cholecalciferol (VITAMIN D3) 25 MCG (1000 UNIT) tablet Take 1,000 Units by mouth daily.     Cyanocobalamin (B-12 PO) Take 1,000 mcg by mouth daily.     folic acid (FOLVITE) 782 MCG tablet Take 800 mcg by mouth daily.     furosemide (LASIX) 20 MG tablet TAKE 1 TABLET (20 MG TOTAL) BY MOUTH DAILY AS NEEDED FOR EDEMA (WEIGHT GAIN). 30 tablet 5   ibuprofen (ADVIL,MOTRIN) 200 MG tablet Take 400 mg by mouth every 6 (six) hours as needed for headache, fever or mild pain.     metoprolol succinate (TOPROL XL) 25 MG 24 hr tablet Take 0.5 tablets (12.5 mg total) by mouth daily. 30 tablet 11   metroNIDAZOLE (METROGEL) 0.75 % gel Apply 1 application topically at bedtime.     naphazoline-pheniramine (VISINE) 0.025-0.3 % ophthalmic solution Place 1 drop into both eyes daily as needed for eye irritation.     sacubitril-valsartan (ENTRESTO) 24-26 MG Take 1 tablet by mouth 2 (two) times daily. 180 tablet 3   triamcinolone (NASACORT) 55 MCG/ACT AERO nasal inhaler Place 2 sprays into the nose 2 (two) times daily as needed (Congestion).     vitamin E 1000 UNIT capsule Take 1,000 Units by mouth daily.     No current facility-administered medications on file prior to visit.    Allergies  Allergen Reactions   Codeine Nausea Only   Hydrocodone Nausea And Vomiting    Physical exam:  Today's Vitals   04/11/21 1304  BP: 134/67  Pulse: (!) 58  Weight: 180 lb 8 oz (81.9 kg)  Height: 5\' 6"  (1.676 m)   Body mass index is 29.13 kg/m.   Wt Readings from Last 3 Encounters:  04/11/21 180 lb 8 oz (81.9 kg)  03/01/21 174 lb 3.2 oz (79 kg)  02/09/21 173 lb 14.4 oz (78.9 kg)     Ht Readings from Last 3 Encounters:  04/11/21 5\' 6"  (1.676 m)  03/01/21 5\' 6"  (1.676 m)  02/09/21 5\' 5"  (1.651 m)      General: The patient is awake, alert and appears not in acute distress. The patient is well groomed. Head: Normocephalic, atraumatic. Neck is supple.  Mallampati 2,  neck circumference:14.5 inches . Nasal airflow patent.  Retrognathia is  seen.  Dental status:  Cardiovascular:  Regular rate and cardiac rhythm by pulse,  without distended neck veins. Respiratory: Lungs are clear to auscultation.  Skin:  Without evidence of ankle edema, or rash. Trunk: The patient's posture is erect.   Neurologic exam : The patient is awake and alert, oriented to place and time.   Memory subjective described as intact.  Attention span & concentration ability appears normal.  Speech is fluent,  without  dysarthria, dysphonia or aphasia.  Mood and affect are appropriate.   Cranial nerves: no loss of smell or taste reported  Pupils are equal and briskly reactive to light. Funduscopic exam deferred..  Extraocular movements in vertical and horizontal planes were intact and without nystagmus. No Diplopia. Visual fields by finger perimetry are intact. Hearing was intact to soft voice and finger rubbing.    Facial sensation intact to fine touch.  Facial motor strength is symmetric and tongue and uvula move midline.  Neck ROM : rotation, tilt and flexion extension were normal for age and shoulder shrug was symmetrical.    Motor exam:  Symmetric bulk, tone and ROM.   Normal tone without cog wheeling, symmetric grip strength .   Sensory:  Fine touch and vibration : normal.  Proprioception tested in the upper extremities was normal.   Coordination: deferred.  Gait and station: Patient could rise unassisted from a seated position, walked without assistive device.  Stance is of normal width/ base and the patient turned with 3-4  steps.  Toe and heel walk were deferred.  Deep tendon reflexes: in the  upper and lower extremities are symmetric and intact.  Babinski response was deferred.       After spending a total time of  25  minutes face to face and additional time for physical and neurologic examination, review of laboratory studies,  personal review of  imaging studies, reports and results of other testing and review of referral information / records as far as provided in visit, I have established the following assessments:  Cardiomyopathy, CHF- ankle edema, abdominal distention,  EF 30% and atrial fib. Nocturia. Snoring. RLS.    OSA confirmed, AHI 32/h. Great response to CPAP, nasal pillow, but dry mouth. Will use mouth tape .   My Plan is to proceed with:  1) continue CPAP use, autotitration:  7-17 cm water, mouth tape and nasal pillows.   Good resolution.   2) We will repeat a HST in September-  once she has had good control of atrial fib for 6 months or more. May have increased EF significantly.  She would be off amiodarone.    I would like to thank Shelly Bowers, Shelly Halsted, MD and Shelly Bowers, Shelly Halsted, Md [address] [place],  [place] [address] for allowing me to meet with and to take care of this pleasant patient.   In short, CARRON MCMURRY is presenting with confirmed OSA/ CSA . Auto CPAP user. ResMed machine 7-17 cm water.   I plan to follow up either personally or through our NP within 8-9 month.   CC: I will share my notes with PCP .  Electronically signed by: Shelly Seat, MD 04/11/2021 1:21 PM  [facility] Board certified by The [employer] of Sleep Medicine and Diplomate of the [employer] of Sleep Medicine. Board certified In Neurology through the [place], Fellow of the [employer] of Neurology. Medical Director of [facility].

## 2021-04-13 ENCOUNTER — Ambulatory Visit
Admission: RE | Admit: 2021-04-13 | Discharge: 2021-04-13 | Disposition: A | Discharge status: Home / Self Care | Payer: Medicare HMO | Source: Ambulatory Visit | Attending: Internal Medicine | Admitting: Internal Medicine

## 2021-04-13 ENCOUNTER — Other Ambulatory Visit: Payer: Self-pay

## 2021-04-13 ENCOUNTER — Other Ambulatory Visit: Payer: Self-pay | Admitting: Internal Medicine

## 2021-04-13 DIAGNOSIS — Z1231 Encounter for screening mammogram for malignant neoplasm of breast: Secondary | ICD-10-CM

## 2021-05-01 ENCOUNTER — Other Ambulatory Visit: Payer: Self-pay

## 2021-05-01 ENCOUNTER — Other Ambulatory Visit (HOSPITAL_COMMUNITY): Payer: Self-pay | Admitting: *Deleted

## 2021-05-01 MED ORDER — AMIODARONE HCL 200 MG PO TABS: 200.0000 mg | ORAL_TABLET | Freq: Every day | ORAL | 3 refills | Status: DC

## 2021-05-04 DIAGNOSIS — G4733 Obstructive sleep apnea (adult) (pediatric): Secondary | ICD-10-CM | POA: Diagnosis not present

## 2021-05-13 ENCOUNTER — Other Ambulatory Visit: Payer: Self-pay | Admitting: Internal Medicine

## 2021-05-13 DIAGNOSIS — E785 Hyperlipidemia, unspecified: Secondary | ICD-10-CM

## 2021-05-16 DIAGNOSIS — Z85828 Personal history of other malignant neoplasm of skin: Secondary | ICD-10-CM | POA: Diagnosis not present

## 2021-05-16 DIAGNOSIS — L718 Other rosacea: Secondary | ICD-10-CM | POA: Diagnosis not present

## 2021-05-16 DIAGNOSIS — L821 Other seborrheic keratosis: Secondary | ICD-10-CM | POA: Diagnosis not present

## 2021-05-18 ENCOUNTER — Telehealth: Payer: Self-pay

## 2021-05-18 NOTE — Telephone Encounter (Signed)
LVM for pt to call me back to schedule sleep study  

## 2021-05-28 NOTE — Progress Notes (Signed)
Cardiology Office Note Date:  05/28/2021  Patient ID:  Shelly, Bowers February 13, 1951, MRN [id number] PCP:  Isaac Bliss, Rayford Halsted, MD  Cardiologist:  Dr. Percival Spanish Electrophysiologist: Dr. Quentin Ore    Chief Complaint: planned f/u  History of Present Illness: Shelly Bowers is a 71 y.o. 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 Quentin Ore, last seen by him Dec 2022.  She was maintaining SR and feeling better.  Recommended 64moAPP visit for rhythm and lab surveillance and him in 65molanned to look at a 21m421moio course. Re-do ablation of needed after that  TODAY She is accompanied by her husband. She is doing quite well. Since amiodarone she does  not think she has had AFib, on a couple occassions her watch notified her of HR breifly 90's-100 range, no symptoms. No CP, palpitations or cardiac awareness. No SOB No dizzy spells, near syncope or syncope. No bleeding or signs of bleeding  Entresto and Eliquis are very expensive for her  AFib Hx Diagnosed June 2022 PVI ablation 11/21/20 AAD Hx Baseline QT felt too long for dofetilide Amiodarone started Oct 2022 (and dig stopped)  Past Medical History:  Diagnosis Date   [place] ([place]  [place] ([place]  HTN (hypertension)    Hyperlipidemia    Osteopenia     Past Surgical History:  Procedure Laterality Date   ATRIAL FIBRILLATION ABLATION N/A 11/21/2020   Procedure: ATRIAL FIBRILLATION ABLATION;  Surgeon: LamVickie EpleyD;  [facility];  Service: Cardiovascular;  Laterality: N/A;   AUGMENTATION MAMMAPLASTY Bilateral    BREAST EXCISIONAL BIOPSY Left    BUBBLE STUDY  08/25/2020   Procedure: BUBBLE STUDY;  Surgeon: ChaWerner LeanD;  [facility];;   CARDIOVERSION N/A 08/25/2020   Procedure: CARDIOVERSION;  Surgeon: ChaWerner LeanD;  [facility];  Service: Cardiovascular;  Laterality: N/A;   CARDIOVERSION N/A 12/26/2020   Procedure: CARDIOVERSION;  Surgeon: ChaWerner LeanD;  [facility];  Laterality: N/A;   INCISION AND DRAINAGE ABSCESS Left 12/27/2015   Procedure: INCISION AND DRAINAGE ABSCESS;  Surgeon: GarDaryll BrodD;  [facility];  Laterality: Left;   TEE WITHOUT CARDIOVERSION N/A 08/25/2020   Procedure: TRANSESOPHAGEAL ECHOCARDIOGRAM (TEE);  Surgeon: ChaWerner LeanD;  [facility];  Service: Cardiovascular;  Laterality: N/A;    Current Outpatient Medications  Medication Sig Dispense Refill   amiodarone (PACERONE) 200 MG tablet Take 1 tablet (200 mg total) by mouth daily. 30 tablet 3   apixaban (ELIQUIS) 5 MG TABS tablet Take 1 tablet (5 mg total) by mouth 2 (two) times daily. 180 tablet 0   atorvastatin (LIPITOR) 10 MG tablet TAKE ONE TABLET BY MOUTH ONE TIME DAILY 90 tablet 1   CALCIUM-VITAMIN D PO Take 1 tablet by mouth daily.     celecoxib (CELEBREX) 200 MG capsule TAKE 1 CAPSULE BY MOUTH 2 TIMES DAILY AS NEEDED 90 capsule 1   cetirizine (ZYRTEC) 10 MG tablet Take 10 mg by mouth daily.     cholecalciferol (VITAMIN D3) 25 MCG (1000 UNIT) tablet Take 1,000 Units by mouth daily.     Cyanocobalamin (B-12 PO) Take 1,000 mcg by mouth daily.     folic acid (FOLVITE) 800093G tablet Take 800 mcg by mouth daily.     furosemide (  LASIX) 20 MG tablet TAKE 1 TABLET (20 MG TOTAL) BY MOUTH DAILY AS NEEDED FOR EDEMA (WEIGHT GAIN). 30 tablet 5   ibuprofen (ADVIL,MOTRIN) 200 MG tablet Take 400 mg by mouth every 6 (six) hours as needed for headache, fever or mild pain.     metoprolol succinate (TOPROL XL) 25 MG 24 hr tablet Take 0.5 tablets (12.5 mg total) by mouth daily. 30 tablet 11   metroNIDAZOLE (METROGEL) 0.75 % gel Apply 1 application topically at bedtime.     naphazoline-pheniramine (VISINE) 0.025-0.3 % ophthalmic  solution Place 1 drop into both eyes daily as needed for eye irritation.     sacubitril-valsartan (ENTRESTO) 24-26 MG Take 1 tablet by mouth 2 (two) times daily. 180 tablet 3   triamcinolone (NASACORT) 55 MCG/ACT AERO nasal inhaler Place 2 sprays into the nose 2 (two) times daily as needed (Congestion).     vitamin E 1000 UNIT capsule Take 1,000 Units by mouth daily.     No current facility-administered medications for this visit.    Allergies:   Codeine and Hydrocodone   Social History:  The patient  reports that she has never smoked. She has never used smokeless tobacco. She reports current alcohol use of about 14.0 standard drinks per week. She reports that she does not use drugs.   Family History:  The patient's family history includes Atrial fibrillation in her father; Breast cancer (age of onset: 13) in her mother.  ROS:  Please see the history of present illness.    All other systems are reviewed and otherwise negative.   PHYSICAL EXAM:  VS:  There were no vitals taken for this visit. BMI: There is no height or weight on file to calculate BMI. Well nourished, well developed, in no acute distress HEENT: normocephalic, atraumatic Neck: no JVD, carotid bruits or masses Cardiac:  RRR; no significant murmurs, no rubs, or gallops Lungs:  CTA b/l, no wheezing, rhonchi or rales Abd: soft, nontender MS: no deformity or atrophy Ext: no edema Skin: warm and dry, no rash Neuro:  No gross deficits appreciated Psych: euthymic mood, full affect    EKG:  not done today  11/21/2020: EPS/Ablation CONCLUSIONS: 1. Successful PVI 2. Successful ablation/isolation of the posterior wall 3. Intracardiac echo reveals small to moderate pericardial effusion adjacent to the LA/RV and posterior LV (present at the beginning of the case) 4. No early apparent complications.   08/25/2020: TEE IMPRESSIONS   1. Left ventricular ejection fraction, by estimation, is 30 to 35%. Left  ventricular ejection  fraction by 3D volume is 30 %. The left ventricle has  moderately decreased function. The left ventricle demonstrates global  hypokinesis.   2. The mitral valve is degenerative. Mild mitral valve regurgitation with  atrial functional regurgitation and two jets: A1-P1 and A3-P3.   3. Left atrial size was moderately dilated. No left atrial/left atrial  appendage thrombus was detected.   4. Evidence of atrial level shunting detected by color flow Doppler.  Agitated saline contrast bubble study was positive with shunting observed  within 3-6 cardiac cycles suggestive of interatrial shunt. There is a  small patent foramen ovale.   5. Right ventricular systolic function is mildly to moderately reduced.  The right ventricular size is normal.   6. Right atrial size was mildly dilated.   7. The aortic valve is tricuspid. Aortic valve regurgitation is trivial.   8. There is mild (Grade II) plaque involving the descending aorta.   Recent Labs: 08/23/2020: B Natriuretic Peptide  370.5 09/01/2020: Magnesium 2.0 02/10/2021: ALT 11; BUN 14; Creatinine, Ser 0.90; Hemoglobin 12.5; Platelets 173.0; Potassium 4.8; Sodium 138; TSH 5.81  02/10/2021: Cholesterol 201; HDL 65.30; LDL Cholesterol 116; Total CHOL/HDL Ratio 3; Triglycerides 97.0; VLDL 19.4   CrCl cannot be calculated (Patient's most recent lab result is older than the maximum 21 days allowed.).   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  F/u with Dr. Quentin Ore 3-32mo sooner if needed  Current medicines are reviewed at length with the patient today.  The patient did not have any concerns regarding medicines.  SVenetia Night PA-C 05/28/2021 1:11 PM     [place] [phone](office)  [phone](fax)

## 2021-05-31 ENCOUNTER — Encounter: Payer: Self-pay | Admitting: Physician Assistant

## 2021-05-31 ENCOUNTER — Other Ambulatory Visit: Payer: Self-pay

## 2021-05-31 ENCOUNTER — Ambulatory Visit: Payer: Medicare HMO | Admitting: Physician Assistant

## 2021-05-31 VITALS — BP 126/74 | HR 53 | Ht 66.0 in | Wt 184.6 lb

## 2021-05-31 DIAGNOSIS — I5022 Chronic systolic (congestive) heart failure: Secondary | ICD-10-CM

## 2021-05-31 DIAGNOSIS — I10 Essential (primary) hypertension: Secondary | ICD-10-CM

## 2021-05-31 DIAGNOSIS — I429 Cardiomyopathy, unspecified: Secondary | ICD-10-CM

## 2021-05-31 DIAGNOSIS — I428 Other cardiomyopathies: Secondary | ICD-10-CM | POA: Diagnosis not present

## 2021-05-31 DIAGNOSIS — I4819 Other persistent atrial fibrillation: Secondary | ICD-10-CM | POA: Diagnosis not present

## 2021-05-31 DIAGNOSIS — Z79899 Other long term (current) drug therapy: Secondary | ICD-10-CM | POA: Diagnosis not present

## 2021-05-31 NOTE — Patient Instructions (Signed)
Medication Instructions:  ? ? ?Your physician recommends that you continue on your current medications as directed. Please refer to the Current Medication list given to you today. ? ? ?*If you need a refill on your cardiac medications before your next appointment, please call your pharmacy* ? ? ?Lab Work:  CMET AND TSH TODAY  ? ?If you have labs (blood work) drawn today and your tests are completely normal, you will receive your results only by: ?MyChart Message (if you have MyChart) OR ?A paper copy in the mail ?If you have any lab test that is abnormal or we need to change your treatment, we will call you to review the results. ? ? ?Testing/Procedures:  Your physician has requested that you have an echocardiogram. Echocardiography is a painless test that uses sound waves to create images of your heart. It provides your doctor with information about the size and shape of your heart and how well your heart?s chambers and valves are working. This procedure takes approximately one hour. There are no restrictions for this procedure. ? ? ?Follow-Up: ?At [facility], you and your health needs are our priority.  As part of our continuing mission to provide you with exceptional heart care, we have created designated Provider Care Teams.  These Care Teams include your primary Cardiologist (physician) and Advanced Practice Providers (APPs -  Physician Assistants and Nurse Practitioners) who all work together to provide you with the care you need, when you need it. ? ?We recommend signing up for the patient portal called "MyChart".  Sign up information is provided on this After Visit Summary.  MyChart is used to connect with patients for Virtual Visits (Telemedicine).  Patients are able to view lab/test results, encounter notes, upcoming appointments, etc.  Non-urgent messages can be sent to your provider as well.   ?To learn more about what you can do with MyChart, go to [email or web address].   ? ?Your next  appointment:   ?3-4  month(s) ? ?The format for your next appointment:   ?In Person ? ?Provider:   ?Lars Mage, MD  ? ?Other Instructions ? ?

## 2021-06-01 ENCOUNTER — Encounter: Payer: Self-pay | Admitting: Internal Medicine

## 2021-06-01 DIAGNOSIS — G4733 Obstructive sleep apnea (adult) (pediatric): Secondary | ICD-10-CM | POA: Diagnosis not present

## 2021-06-01 LAB — COMPREHENSIVE METABOLIC PANEL
ALT: 13 IU/L (ref 0–32)
AST: 16 IU/L (ref 0–40)
Albumin/Globulin Ratio: 2.3 — ABNORMAL HIGH (ref 1.2–2.2)
Albumin: 4.6 g/dL (ref 3.7–4.7)
Alkaline Phosphatase: 81 IU/L (ref 44–121)
BUN/Creatinine Ratio: 18 (ref 12–28)
BUN: 18 mg/dL (ref 8–27)
Bilirubin Total: 0.7 mg/dL (ref 0.0–1.2)
CO2: 24 mmol/L (ref 20–29)
Calcium: 9.6 mg/dL (ref 8.7–10.3)
Chloride: 101 mmol/L (ref 96–106)
Creatinine, Ser: 1.02 mg/dL — ABNORMAL HIGH (ref 0.57–1.00)
Globulin, Total: 2 g/dL (ref 1.5–4.5)
Glucose: 83 mg/dL (ref 70–99)
Potassium: 5.2 mmol/L (ref 3.5–5.2)
Sodium: 141 mmol/L (ref 134–144)
Total Protein: 6.6 g/dL (ref 6.0–8.5)
eGFR: 58 mL/min/{1.73_m2} — ABNORMAL LOW (ref >59)

## 2021-06-01 LAB — TSH: TSH: 4.24 u[IU]/mL (ref 0.450–4.500)

## 2021-06-08 ENCOUNTER — Ambulatory Visit (HOSPITAL_COMMUNITY): Payer: Medicare HMO | Attending: Cardiology

## 2021-06-08 ENCOUNTER — Other Ambulatory Visit: Payer: Self-pay

## 2021-06-08 DIAGNOSIS — I340 Nonrheumatic mitral (valve) insufficiency: Secondary | ICD-10-CM | POA: Diagnosis not present

## 2021-06-08 DIAGNOSIS — I351 Nonrheumatic aortic (valve) insufficiency: Secondary | ICD-10-CM | POA: Diagnosis not present

## 2021-06-08 DIAGNOSIS — E785 Hyperlipidemia, unspecified: Secondary | ICD-10-CM | POA: Diagnosis not present

## 2021-06-08 DIAGNOSIS — I4891 Unspecified atrial fibrillation: Secondary | ICD-10-CM | POA: Diagnosis not present

## 2021-06-08 DIAGNOSIS — I350 Nonrheumatic aortic (valve) stenosis: Secondary | ICD-10-CM

## 2021-06-08 DIAGNOSIS — I10 Essential (primary) hypertension: Secondary | ICD-10-CM | POA: Diagnosis not present

## 2021-06-08 DIAGNOSIS — I429 Cardiomyopathy, unspecified: Secondary | ICD-10-CM | POA: Insufficient documentation

## 2021-06-09 DIAGNOSIS — H5203 Hypermetropia, bilateral: Secondary | ICD-10-CM | POA: Diagnosis not present

## 2021-06-09 DIAGNOSIS — H524 Presbyopia: Secondary | ICD-10-CM | POA: Diagnosis not present

## 2021-06-09 DIAGNOSIS — H31011 Macula scars of posterior pole (postinflammatory) (post-traumatic), right eye: Secondary | ICD-10-CM | POA: Diagnosis not present

## 2021-06-09 DIAGNOSIS — H3122 Choroidal dystrophy (central areolar) (generalized) (peripapillary): Secondary | ICD-10-CM | POA: Diagnosis not present

## 2021-06-09 DIAGNOSIS — H52223 Regular astigmatism, bilateral: Secondary | ICD-10-CM | POA: Diagnosis not present

## 2021-06-10 LAB — ECHOCARDIOGRAM COMPLETE
Area-P 1/2: 2.56 cm2
Calc EF: 59 %
MV M vel: 5.09 m/s
MV Peak grad: 103.6 mmHg
P 1/2 time: 489 msec
S' Lateral: 3.1 cm
Single Plane A2C EF: 58.5 %
Single Plane A4C EF: 58.8 %

## 2021-06-21 ENCOUNTER — Other Ambulatory Visit: Payer: Self-pay | Admitting: Cardiology

## 2021-06-28 DIAGNOSIS — G4733 Obstructive sleep apnea (adult) (pediatric): Secondary | ICD-10-CM | POA: Diagnosis not present

## 2021-07-02 DIAGNOSIS — G4733 Obstructive sleep apnea (adult) (pediatric): Secondary | ICD-10-CM | POA: Diagnosis not present

## 2021-07-28 DIAGNOSIS — G4733 Obstructive sleep apnea (adult) (pediatric): Secondary | ICD-10-CM | POA: Diagnosis not present

## 2021-08-01 DIAGNOSIS — G4733 Obstructive sleep apnea (adult) (pediatric): Secondary | ICD-10-CM | POA: Diagnosis not present

## 2021-08-17 ENCOUNTER — Other Ambulatory Visit: Payer: Self-pay | Admitting: Internal Medicine

## 2021-08-17 ENCOUNTER — Encounter: Payer: Self-pay | Admitting: Internal Medicine

## 2021-08-17 ENCOUNTER — Ambulatory Visit (INDEPENDENT_AMBULATORY_CARE_PROVIDER_SITE_OTHER): Payer: Medicare HMO | Admitting: Internal Medicine

## 2021-08-17 VITALS — BP 124/60 | HR 52 | Temp 97.5°F | Ht 66.0 in | Wt 191.5 lb

## 2021-08-17 DIAGNOSIS — R0609 Other forms of dyspnea: Secondary | ICD-10-CM

## 2021-08-17 DIAGNOSIS — G4733 Obstructive sleep apnea (adult) (pediatric): Secondary | ICD-10-CM | POA: Diagnosis not present

## 2021-08-17 DIAGNOSIS — I4819 Other persistent atrial fibrillation: Secondary | ICD-10-CM | POA: Diagnosis not present

## 2021-08-17 DIAGNOSIS — I10 Essential (primary) hypertension: Secondary | ICD-10-CM | POA: Diagnosis not present

## 2021-08-17 DIAGNOSIS — D6869 Other thrombophilia: Secondary | ICD-10-CM

## 2021-08-17 DIAGNOSIS — E785 Hyperlipidemia, unspecified: Secondary | ICD-10-CM | POA: Diagnosis not present

## 2021-08-17 DIAGNOSIS — E039 Hypothyroidism, unspecified: Secondary | ICD-10-CM

## 2021-08-17 LAB — T4, FREE: Free T4: 0.93 ng/dL (ref 0.60–1.60)

## 2021-08-17 LAB — TSH: TSH: 7.31 u[IU]/mL — ABNORMAL HIGH (ref 0.35–5.50)

## 2021-08-17 LAB — T3, FREE: T3, Free: 2.8 pg/mL (ref 2.3–4.2)

## 2021-08-17 MED ORDER — LEVOTHYROXINE SODIUM 50 MCG PO TABS: 50.0000 ug | ORAL_TABLET | Freq: Every day | ORAL | 1 refills | Status: DC

## 2021-08-17 NOTE — Progress Notes (Signed)
Established Patient Office Visit     CC/Reason for Visit: Follow-up chronic conditions and discuss some acute concerns  HPI: Shelly Bowers is a 71 y.o. female who is coming in today for the above mentioned reasons. Past Medical History is significant for: Hypertension, hyperlipidemia, A-fib on Eliquis and amiodarone followed by cardiology, obstructive sleep apnea on CPAP therapy.  She has been having issues sleeping due to itchiness.  She wonders if trying a Benadryl is okay.  She was recently diagnosed with subclinical hypothyroidism and is due to have thyroid function tests redone today.  She is due to have fasting lipids.  She is complaining of dyspnea on exertion.  Her A-fib is now stable and cardiology does not believe it is related to her heart.  She has a friend that is on Trelegy and wonders if we can start this inhaler.  She has never been diagnosed with asthma or COPD.   Past Medical/Surgical History: Past Medical History:  Diagnosis Date   Atrial fibrillation ([place])    Cardiomyopathy ([place])    HTN (hypertension)    Hyperlipidemia    Osteopenia     Past Surgical History:  Procedure Laterality Date   ATRIAL FIBRILLATION ABLATION N/A 11/21/2020   Procedure: ATRIAL FIBRILLATION ABLATION;  Surgeon: Vickie Epley, MD;  Location: [place] [facility];  Service: Cardiovascular;  Laterality: N/A;   AUGMENTATION MAMMAPLASTY Bilateral    BREAST EXCISIONAL BIOPSY Left    BUBBLE STUDY  08/25/2020   Procedure: BUBBLE STUDY;  Surgeon: Werner Lean, MD;  Location: [place];  Service: Cardiovascular;;   CARDIOVERSION N/A 08/25/2020   Procedure: CARDIOVERSION;  Surgeon: Werner Lean, MD;  Location: [place] [facility];  Service: Cardiovascular;  Laterality: N/A;   CARDIOVERSION N/A 12/26/2020   Procedure: CARDIOVERSION;  Surgeon: Werner Lean, MD;  Location: [facility];  Service: Cardiovascular;  Laterality: N/A;   INCISION AND DRAINAGE ABSCESS Left  12/27/2015   Procedure: INCISION AND DRAINAGE ABSCESS;  Surgeon: Daryll Brod, MD;  Location: [place];  Service: Orthopedics;  Laterality: Left;   TEE WITHOUT CARDIOVERSION N/A 08/25/2020   Procedure: TRANSESOPHAGEAL ECHOCARDIOGRAM (TEE);  Surgeon: Werner Lean, MD;  [facility];  Service: Cardiovascular;  Laterality: N/A;    Social History:  reports that she has never smoked. She has never used smokeless tobacco. She reports current alcohol use of about 14.0 standard drinks per week. She reports that she does not use drugs.  Allergies: Allergies  Allergen Reactions   Codeine Nausea Only   Hydrocodone Nausea And Vomiting    Family History:  Family History  Problem Relation Age of Onset   Breast cancer Mother 54   Atrial fibrillation Father      Current Outpatient Medications:    amiodarone (PACERONE) 200 MG tablet, Take 1 tablet (200 mg total) by mouth daily., Disp: 30 tablet, Rfl: 3   apixaban (ELIQUIS) 5 MG TABS tablet, Take 1 tablet (5 mg total) by mouth 2 (two) times daily., Disp: 180 tablet, Rfl: 0   atorvastatin (LIPITOR) 10 MG tablet, TAKE ONE TABLET BY MOUTH ONE TIME DAILY, Disp: 90 tablet, Rfl: 1   CALCIUM-VITAMIN D PO, Take 1 tablet by mouth daily., Disp: , Rfl:    celecoxib (CELEBREX) 200 MG capsule, TAKE 1 CAPSULE BY MOUTH 2 TIMES DAILY AS NEEDED, Disp: 90 capsule, Rfl: 1   cetirizine (ZYRTEC) 10 MG tablet, Take 10 mg by mouth daily., Disp: , Rfl:    cholecalciferol (VITAMIN D3)  25 MCG (1000 UNIT) tablet, Take 1,000 Units by mouth daily., Disp: , Rfl:    Cyanocobalamin (B-12 PO), Take 1,000 mcg by mouth daily., Disp: , Rfl:    ENTRESTO 24-26 MG, TAKE 1 TABLET BY MOUTH TWICE A DAY, Disp: 180 tablet, Rfl: 3   folic acid (FOLVITE) 546 MCG tablet, Take 800 mcg by mouth daily., Disp: , Rfl:    furosemide (LASIX) 20 MG tablet, TAKE 1 TABLET (20 MG TOTAL) BY MOUTH DAILY AS NEEDED FOR EDEMA (WEIGHT GAIN)., Disp: 30 tablet, Rfl: 5   ibuprofen  (ADVIL,MOTRIN) 200 MG tablet, Take 400 mg by mouth every 6 (six) hours as needed for headache, fever or mild pain., Disp: , Rfl:    metoprolol succinate (TOPROL XL) 25 MG 24 hr tablet, Take 0.5 tablets (12.5 mg total) by mouth daily., Disp: 30 tablet, Rfl: 11   metroNIDAZOLE (METROGEL) 0.75 % gel, Apply 1 application topically at bedtime., Disp: , Rfl:    naphazoline-pheniramine (VISINE) 0.025-0.3 % ophthalmic solution, Place 1 drop into both eyes daily as needed for eye irritation., Disp: , Rfl:    triamcinolone (NASACORT) 55 MCG/ACT AERO nasal inhaler, Place 2 sprays into the nose 2 (two) times daily as needed (Congestion)., Disp: , Rfl:    vitamin E 1000 UNIT capsule, Take 1,000 Units by mouth daily., Disp: , Rfl:   Review of Systems:  Constitutional: Denies fever, chills, diaphoresis, appetite change.  HEENT: Denies photophobia, eye pain, redness, hearing loss, ear pain, congestion, sore throat, rhinorrhea, sneezing, mouth sores, trouble swallowing, neck pain, neck stiffness and tinnitus.   Respiratory: Denies  cough, chest tightness,  and wheezing.   Cardiovascular: Denies chest pain, palpitations and leg swelling.  Gastrointestinal: Denies nausea, vomiting, abdominal pain, diarrhea, constipation, blood in stool and abdominal distention.  Genitourinary: Denies dysuria, urgency, frequency, hematuria, flank pain and difficulty urinating.  Endocrine: Denies: hot or cold intolerance, sweats, changes in hair or nails, polyuria, polydipsia. Musculoskeletal: Denies myalgias, back pain, joint swelling, arthralgias and gait problem.  Skin: Denies pallor, rash and wound.  Neurological: Denies dizziness, seizures, syncope, weakness, light-headedness, numbness and headaches.  Hematological: Denies adenopathy. Easy bruising, personal or family bleeding history  Psychiatric/Behavioral: Denies suicidal ideation, mood changes, confusion, nervousness, and agitation    Physical Exam: Vitals:   08/17/21  1007  BP: 124/60  Pulse: (!) 52  Temp: (!) 97.5 F (36.4 C)  TempSrc: Oral  SpO2: 95%  Weight: 191 lb 8 oz (86.9 kg)  Height: '5\' 6"'$  (1.676 m)    Body mass index is 30.91 kg/m.   Constitutional: NAD, calm, comfortable, obese Eyes: PERRL, lids and conjunctivae normal, wears corrective lenses ENMT: Mucous membranes are moist.  Respiratory: clear to auscultation bilaterally, no wheezing, no crackles. Normal respiratory effort. No accessory muscle use.  Cardiovascular: Regular rate and rhythm, no murmurs / rubs / gallops. No extremity edema.  Psychiatric: Normal judgment and insight. Alert and oriented x 3. Normal mood.    Impression and Plan:  Persistent atrial fibrillation ([place]) -Followed by cardiology, stable  Hypothyroidism, unspecified type  - Plan: TSH, T3, Free, T4, Free  Secondary hypercoagulable state ([place]) -On Eliquis  Essential hypertension -Well-controlled  Hyperlipidemia, unspecified hyperlipidemia type  - Plan: Lipid panel  OSA (obstructive sleep apnea) -Okay to use occasional Benadryl, continue to use CPAP nightly.  DOE (dyspnea on exertion) -Diagnosis is unclear, however as cardiology has ruled out her heart as a reason we will refer to pulmonary.  She might benefit from PFTs.  No Trelegy for  now.    Time spent:34 minutes reviewing chart, interviewing and examining patient and formulating plan of care.     Lelon Frohlich, MD [facility]

## 2021-08-24 DIAGNOSIS — N905 Atrophy of vulva: Secondary | ICD-10-CM | POA: Diagnosis not present

## 2021-08-24 DIAGNOSIS — R159 Full incontinence of feces: Secondary | ICD-10-CM | POA: Diagnosis not present

## 2021-08-24 DIAGNOSIS — Z6829 Body mass index (BMI) 29.0-29.9, adult: Secondary | ICD-10-CM | POA: Diagnosis not present

## 2021-08-24 DIAGNOSIS — N959 Unspecified menopausal and perimenopausal disorder: Secondary | ICD-10-CM | POA: Diagnosis not present

## 2021-08-24 DIAGNOSIS — Z01419 Encounter for gynecological examination (general) (routine) without abnormal findings: Secondary | ICD-10-CM | POA: Diagnosis not present

## 2021-08-28 DIAGNOSIS — G4733 Obstructive sleep apnea (adult) (pediatric): Secondary | ICD-10-CM | POA: Diagnosis not present

## 2021-09-01 DIAGNOSIS — G4733 Obstructive sleep apnea (adult) (pediatric): Secondary | ICD-10-CM | POA: Diagnosis not present

## 2021-09-11 ENCOUNTER — Encounter: Payer: Self-pay | Admitting: Pulmonary Disease

## 2021-09-11 ENCOUNTER — Other Ambulatory Visit: Payer: Self-pay

## 2021-09-11 ENCOUNTER — Ambulatory Visit: Payer: Medicare HMO | Admitting: Pulmonary Disease

## 2021-09-11 VITALS — BP 130/68 | HR 57 | Ht 65.0 in | Wt 193.0 lb

## 2021-09-11 DIAGNOSIS — R0609 Other forms of dyspnea: Secondary | ICD-10-CM

## 2021-09-11 MED ORDER — AMIODARONE HCL 200 MG PO TABS: 200.0000 mg | ORAL_TABLET | Freq: Every day | ORAL | 0 refills | Status: DC

## 2021-09-11 NOTE — Patient Instructions (Signed)
Nice to meet you  To further evaluate your shortness of breath, or pulmonary function test.  We will get the scheduled the next couple of weeks and follow-up with me on the same day to discuss results and next steps if any are needed.  Even if the pulmonary function test are normal, we can consider trying an inhaler in the future but we will hold on that for now and discuss at a later date.  Return to clinic in 2 to 3 weeks with PFTs same day.

## 2021-09-11 NOTE — Progress Notes (Signed)
$'@Patient'T$  ID: Shelly Bowers, female    DOB: [birth year], 71 y.o.   MRN: [id number]  Chief Complaint  Patient presents with   Consult    Pt is here for consult for DOE. She states she is fine with normal walking. CHF last year noted. DOE started after she went to hospital last year. Pt states no inhalers noted at this time     Referring provider: Isaac Bliss, [place]*  HPI:   71 y.o. woman whom we have seen in consultation for evaluation of dyspnea on exertion.  Most recent cardiology note x2 reviewed.  Patient was usual state of health.  Felt short of breath Memorial Day weekend 2022.  Went to urgent care.  Tested for COVID but was sent to the ED and ambulance.  Presented with tachycardia.  Multiple COVID test negative.  Treated for atrial fibrillation with medications and anticoagulation.  Subsequently went through more than 1 cardioversion.  Subsequently required ablation 10/2020.  Since then has been dyspneic.  Worse with inclines or stairs.  Or rapid walking.  Otherwise symptoms are okay.  No time of day when things are better or worse.  No position that makes things better or worse.  No seasonal environmental factors she can identify to make things better or worse.  No other alleviating or exacerbating factors.  In addition, she has an intermittent cough.  Present with COVID.  Mostly in the mornings.  After stopping her CPAP therapy.  A lot of drainage, congestion.  Uses Flonase to help some.  She has seasonal allergies.  Watery eyes, runny nose with change of seasons.  Symptoms of dyspnea did not significantly worsen after her A-fib ablation.  No history of asthma.  Does report intermittent URIs although none in the last year or 2.  Review chest x-ray 08/23/2020 lumbar reviewed through clinic reveals clear lungs bilaterally.  Revealed portion of lungs seen on CTA cardiac in preparation of A-fib ablation that reveals clear lungs bilaterally with the exception of linear atelectasis in  the bases.  She had TTE most recently 05/2021 that shows grade 2 diastolic dysfunction, moderately dilated LA, mild MVR, mild AI, elevated estimated PASP.  This showed improvement in EF compared to 08/2020, improved RV function as well.  2017 showed dilated left atrium as well.  PMH: Atrial fibrillation on anticoagulation status post ablation and cardioversion, congestive heart failure, hypothyroid Surgical history: Breast augmentation, breast biopsy, Family history: Mother with breast cancer in father with atrial fibrillation Social history: Never smoker, lives in [place] / Pulmonary Flowsheets:   ACT:      No data to display          MMRC: mMRC Dyspnea Scale mMRC Score  09/11/2021  2:19 PM 3    Epworth:      No data to display          Tests:   FENO:  No results found for: "NITRICOXIDE"  PFT:     No data to display          WALK:      No data to display          Imaging: Personally reviewed and as per EMR discussion this note No results found.  Lab Results: Personally reviewed, no anemia CBC    Component Value Date/Time   WBC 4.4 02/10/2021 1002   RBC 3.73 (L) 02/10/2021 1002   HGB 12.5 02/10/2021 1002   HGB 13.1 11/08/2020 1324   HCT 37.4 02/10/2021  1002   HCT 40.0 11/08/2020 1324   PLT 173.0 02/10/2021 1002   PLT 201 11/08/2020 1324   MCV 100.2 (H) 02/10/2021 1002   MCV 99 (H) 11/08/2020 1324   MCH 33.6 12/19/2020 1444   MCHC 33.4 02/10/2021 1002   RDW 13.9 02/10/2021 1002   RDW 13.3 11/08/2020 1324   LYMPHSABS 1.3 02/10/2021 1002   LYMPHSABS 2.2 11/08/2020 1324   MONOABS 0.4 02/10/2021 1002   EOSABS 0.2 02/10/2021 1002   EOSABS 0.1 11/08/2020 1324   BASOSABS 0.0 02/10/2021 1002   BASOSABS 0.0 11/08/2020 1324    BMET    Component Value Date/Time   NA 141 05/31/2021 1412   K 5.2 05/31/2021 1412   CL 101 05/31/2021 1412   CO2 24 05/31/2021 1412   GLUCOSE 83 05/31/2021 1412   GLUCOSE 95 02/10/2021 1002    BUN 18 05/31/2021 1412   CREATININE 1.02 (H) 05/31/2021 1412   CREATININE 0.89 01/22/2020 1115   CALCIUM 9.6 05/31/2021 1412   GFRNONAA >60 12/19/2020 1444    BNP    Component Value Date/Time   BNP 370.5 (H) 08/23/2020 2020    ProBNP No results found for: "PROBNP"  Specialty Problems   None   Allergies  Allergen Reactions   Codeine Nausea Only   Hydrocodone Nausea And Vomiting    Immunization History  Administered Date(s) Administered   Fluad Quad(high Dose 65+) 01/14/2019, 01/19/2021   Influenza Split 03/07/2011, 01/23/2012   Influenza Whole 01/07/2008   Influenza, High Dose Seasonal PF 01/11/2016, 02/06/2017, 01/22/2018, 01/13/2020   Influenza,inj,Quad PF,6+ Mos 02/12/2013, 01/27/2014, 02/25/2015   Influenza-Unspecified 12/24/2013, 12/27/2014   PFIZER(Purple Top)SARS-COV-2 Vaccination 04/23/2019, 05/21/2019, 01/13/2020, 08/30/2020   Pfizer Covid-19 Vaccine Bivalent Booster 63yr & up 03/22/2021   Pneumococcal Conjugate-13 07/07/2014   Pneumococcal Polysaccharide-23 06/17/2013   Tdap 04/19/2021   Tetanus 06/17/2013   Zoster Recombinat (Shingrix) 08/30/2020, 03/07/2021   Zoster, Live 03/14/2011    Past Medical History:  Diagnosis Date   Atrial fibrillation ([place]    Cardiomyopathy ([place]    HTN (hypertension)    Hyperlipidemia    Osteopenia     Tobacco History: Social History   Tobacco Use  Smoking Status Never  Smokeless Tobacco Never   Counseling given: Not Answered   Continue to not smoke  Outpatient Encounter Medications as of 09/11/2021  Medication Sig   amiodarone (PACERONE) 200 MG tablet Take 1 tablet (200 mg total) by mouth daily.   apixaban (ELIQUIS) 5 MG TABS tablet Take 1 tablet (5 mg total) by mouth 2 (two) times daily.   atorvastatin (LIPITOR) 10 MG tablet TAKE ONE TABLET BY MOUTH ONE TIME DAILY   CALCIUM-VITAMIN D PO Take 1 tablet by mouth daily.   celecoxib (CELEBREX) 200 MG capsule TAKE 1 CAPSULE BY MOUTH 2 TIMES DAILY AS NEEDED    cetirizine (ZYRTEC) 10 MG tablet Take 10 mg by mouth daily.   cholecalciferol (VITAMIN D3) 25 MCG (1000 UNIT) tablet Take 1,000 Units by mouth daily.   Cyanocobalamin (B-12 PO) Take 1,000 mcg by mouth daily.   ENTRESTO 24-26 MG TAKE 1 TABLET BY MOUTH TWICE A DAY   folic acid (FOLVITE) 8361MCG tablet Take 800 mcg by mouth daily.   furosemide (LASIX) 20 MG tablet TAKE 1 TABLET (20 MG TOTAL) BY MOUTH DAILY AS NEEDED FOR EDEMA (WEIGHT GAIN).   ibuprofen (ADVIL,MOTRIN) 200 MG tablet Take 400 mg by mouth every 6 (six) hours as needed for headache, fever or mild pain.   levothyroxine (SYNTHROID) 50  MCG tablet Take 1 tablet (50 mcg total) by mouth daily.   metoprolol succinate (TOPROL XL) 25 MG 24 hr tablet Take 0.5 tablets (12.5 mg total) by mouth daily.   metroNIDAZOLE (METROGEL) 0.75 % gel Apply 1 application topically at bedtime.   naphazoline-pheniramine (VISINE) 0.025-0.3 % ophthalmic solution Place 1 drop into both eyes daily as needed for eye irritation.   triamcinolone (NASACORT) 55 MCG/ACT AERO nasal inhaler Place 2 sprays into the nose 2 (two) times daily as needed (Congestion).   vitamin E 1000 UNIT capsule Take 1,000 Units by mouth daily.   No facility-administered encounter medications on file as of 09/11/2021.     Review of Systems  Review of Systems  No chest pain with exertion.  No orthopnea or PND.  Comprehensive review of systems otherwise negative. Physical Exam  BP 130/68 (BP Location: Left Arm, Patient Position: Sitting, Cuff Size: Normal)   Pulse (!) 57   Ht '5\' 5"'$  (1.651 m)   Wt 193 lb (87.5 kg)   SpO2 99%   BMI 32.12 kg/m   Wt Readings from Last 5 Encounters:  09/11/21 193 lb (87.5 kg)  08/17/21 191 lb 8 oz (86.9 kg)  05/31/21 184 lb 9.6 oz (83.7 kg)  04/11/21 180 lb 8 oz (81.9 kg)  03/01/21 174 lb 3.2 oz (79 kg)    BMI Readings from Last 5 Encounters:  09/11/21 32.12 kg/m  08/17/21 30.91 kg/m  05/31/21 29.80 kg/m  04/11/21 29.13 kg/m  03/01/21 28.12  kg/m     Physical Exam General: Well-appearing, in no acute distress Eyes: EOMI, icterus Neck: Supple, no JVP Pulmonary: Clear, no crackles, normal work of breathing Cardiovascular: Bradycardic, no murmur Abdomen: Nondistended, Bowel sounds present MSK: No synovitis, no joint effusion Neuro: Normal gait, no weakness Psych: Normal mood, full affect   Assessment & Plan:   Dyspnea on exertion: Suspect multifactorial related to deconditioning given decrease in physical activity over the last year plus, cardiac contributors given dilated left atrium, grade 2 diastolic dysfunction, mild MVR and AI.  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  Chronotropic incompetence possible given beta-blockade in the setting of atrial fibrillation.  Possible mild pulmonary hypertension based on most recent echocardiogram is likely group 2 in nature given chronic and serial dilated left atrium.  Prior chest imaging clear.  She has atopic symptoms, asthma as a possible contributor.  Undiagnosed.  PFTs for further evaluation.  No further chest imaging at this time.  Consider trial of bronchodilators, ICS-LABA in the future even if PFTs normal.   Return in about 2 weeks (around 09/25/2021).   Lanier Clam, MD 09/11/2021

## 2021-09-13 ENCOUNTER — Encounter: Payer: Self-pay | Admitting: Cardiology

## 2021-09-13 ENCOUNTER — Ambulatory Visit: Payer: Medicare HMO | Admitting: Cardiology

## 2021-09-13 VITALS — BP 136/70 | HR 55 | Ht 65.0 in | Wt 193.0 lb

## 2021-09-13 DIAGNOSIS — I4819 Other persistent atrial fibrillation: Secondary | ICD-10-CM | POA: Diagnosis not present

## 2021-09-13 DIAGNOSIS — I5022 Chronic systolic (congestive) heart failure: Secondary | ICD-10-CM

## 2021-09-13 DIAGNOSIS — Z79899 Other long term (current) drug therapy: Secondary | ICD-10-CM | POA: Diagnosis not present

## 2021-09-13 NOTE — Patient Instructions (Addendum)
Medication Instructions:  Stop Amiodarone  Your physician recommends that you continue on your current medications as directed. Please refer to the Current Medication list given to you today. *If you need a refill on your cardiac medications before your next appointment, please call your pharmacy*  Lab Work: None. If you have labs (blood work) drawn today and your tests are completely normal, you will receive your results only by: [place] (if you have MyChart) OR A paper copy in the mail If you have any lab test that is abnormal or we need to change your treatment, we will call you to review the results.  Testing/Procedures: None.  Follow-Up: At [facility], you and your health needs are our priority.  As part of our continuing mission to provide you with exceptional heart care, we have created designated Provider Care Teams.  These Care Teams include your primary Cardiologist (physician) and Advanced Practice Providers (APPs -  Physician Assistants and Nurse Practitioners) who all work together to provide you with the care you need, when you need it.  Your physician wants you to follow-up in: 6 months with  one of the following Advanced Practice Providers on your designated Care Team:    Tommye Standard, PA-C    You will receive a reminder letter in the mail two months in advance. If you don't receive a letter, please call our office to schedule the follow-up appointment.  We recommend signing up for the patient portal called "MyChart".  Sign up information is provided on this After Visit Summary.  MyChart is used to connect with patients for Virtual Visits (Telemedicine).  Patients are able to view lab/test results, encounter notes, upcoming appointments, etc.  Non-urgent messages can be sent to your provider as well.   To learn more about what you can do with MyChart, go to [email or web address].    Any Other Special Instructions Will Be Listed Below (If Applicable).

## 2021-09-13 NOTE — Progress Notes (Signed)
Electrophysiology Office Follow up Visit Note:    Date:  09/13/2021   ID:  Shelly Bowers, DOB November 27, 1950, MRN [id number]  PCP:  Isaac Bliss, Rayford Halsted, MD  [facility] Cardiologist:  Minus Breeding, MD  [facility] Electrophysiologist:  Vickie Epley, MD    Interval History:    Shelly Bowers is a 71 y.o. female who presents for a follow up visit. They were last seen in clinic 03/01/2021.  Since their last appointment, they followed up with renee Bowers on 05/31/2021 where she did not believe she had any recurrent Afib since starting amiodarone. A few times her watch notified her of heart rates in the 90's-100, but she was asymptomatic. CHA2DS2Vasc is 4, on Eliquis. A repeat Echo was ordered with plans to stop amiodarone if her EF is good. Her Echo 05/2021 showed LVEF 55-60%.  Today, she reports that she still has shortness of breath. However, she also admits to a weight gain of 20+ lbs since October, which she believes to be contributory. She has been hesitant to exercise for fear of causing herself to revert to atrial fibrillation. She will try to do cardio exercises, but is not able to do much due to her shortness of breath.  At lunch she typically drinks Half and Half at lunch. She will drink water, or an occasional diet coke. Lately she has been eating whatever she would like.  She had enough amiodarone to last until today, in anticipation of discontinuing the medication.  They deny any palpitations, chest pain, or peripheral edema. No lightheadedness, headaches, syncope, orthopnea, or PND.      Past Medical History:  Diagnosis Date   Atrial fibrillation ([place])    Cardiomyopathy ([place])    HTN (hypertension)    Hyperlipidemia    Osteopenia     Past Surgical History:  Procedure Laterality Date   ATRIAL FIBRILLATION ABLATION N/A 11/21/2020   Procedure: ATRIAL FIBRILLATION ABLATION;  Surgeon: Vickie Epley, MD;  Location: [place] CV LAB;  Service:  Cardiovascular;  Laterality: N/A;   AUGMENTATION MAMMAPLASTY Bilateral    BREAST EXCISIONAL BIOPSY Left    BUBBLE STUDY  08/25/2020   Procedure: BUBBLE STUDY;  Surgeon: Werner Lean, MD;  Location: [place];  Service: Cardiovascular;;   CARDIOVERSION N/A 08/25/2020   Procedure: CARDIOVERSION;  Surgeon: Werner Lean, MD;  Location: [facility]OSCOPY;  Service: Cardiovascular;  Laterality: N/A;   CARDIOVERSION N/A 12/26/2020   Procedure: CARDIOVERSION;  Surgeon: Werner Lean, MD;  Location: [place];  Service: Cardiovascular;  Laterality: N/A;   INCISION AND DRAINAGE ABSCESS Left 12/27/2015   Procedure: INCISION AND DRAINAGE ABSCESS;  Surgeon: Daryll Brod, MD;  Location: [facility];  Service: Orthopedics;  Laterality: Left;   TEE WITHOUT CARDIOVERSION N/A 08/25/2020   Procedure: TRANSESOPHAGEAL ECHOCARDIOGRAM (TEE);  Surgeon: Werner Lean, MD;  Location[facility];  Service: Cardiovascular;  Laterality: N/A;    Current Medications: Current Meds  Medication Sig   amiodarone (PACERONE) 200 MG tablet Take 1 tablet (200 mg total) by mouth daily. Must keep upcoming appointment for future refills. Thank you.   apixaban (ELIQUIS) 5 MG TABS tablet Take 1 tablet (5 mg total) by mouth 2 (two) times daily.   atorvastatin (LIPITOR) 10 MG tablet TAKE ONE TABLET BY MOUTH ONE TIME DAILY   CALCIUM-VITAMIN D PO Take 1 tablet by mouth daily.   celecoxib (CELEBREX) 200 MG capsule TAKE 1 CAPSULE BY MOUTH 2 TIMES DAILY AS NEEDED   cetirizine (  ZYRTEC) 10 MG tablet Take 10 mg by mouth daily.   cholecalciferol (VITAMIN D3) 25 MCG (1000 UNIT) tablet Take 1,000 Units by mouth daily.   Cyanocobalamin (B-12 PO) Take 1,000 mcg by mouth daily.   ENTRESTO 24-26 MG TAKE 1 TABLET BY MOUTH TWICE A DAY   folic acid (FOLVITE) 347 MCG tablet Take 800 mcg by mouth daily.   furosemide (LASIX) 20 MG tablet TAKE 1 TABLET (20 MG TOTAL) BY MOUTH DAILY AS NEEDED FOR EDEMA  (WEIGHT GAIN).   ibuprofen (ADVIL,MOTRIN) 200 MG tablet Take 400 mg by mouth every 6 (six) hours as needed for headache, fever or mild pain.   levothyroxine (SYNTHROID) 50 MCG tablet Take 1 tablet (50 mcg total) by mouth daily.   metoprolol succinate (TOPROL XL) 25 MG 24 hr tablet Take 0.5 tablets (12.5 mg total) by mouth daily.   metroNIDAZOLE (METROGEL) 0.75 % gel Apply 1 application topically at bedtime.   naphazoline-pheniramine (VISINE) 0.025-0.3 % ophthalmic solution Place 1 drop into both eyes daily as needed for eye irritation.   triamcinolone (NASACORT) 55 MCG/ACT AERO nasal inhaler Place 2 sprays into the nose 2 (two) times daily as needed (Congestion).   vitamin E 1000 UNIT capsule Take 1,000 Units by mouth daily.     Allergies:   Codeine and Hydrocodone   Social History   Socioeconomic History   Marital status: Married    Spouse name: Not on file   Number of children: Not on file   Years of education: Not on file   Highest education level: Not on file  Occupational History   Not on file  Tobacco Use   Smoking status: Never   Smokeless tobacco: Never  Substance and Sexual Activity   Alcohol use: Yes    Alcohol/week: 14.0 standard drinks of alcohol    Types: 14 Glasses of wine per week    Comment: 1 glass of wine nightly   Drug use: No   Sexual activity: Not on file  Other Topics Concern   Not on file  Social History Narrative   Left handed   Caffeine use: tea sometimes   Soda-zero sugar/no caffeine ginger ale   1-2 cups coffee per day (decaf)   Social Determinants of Health   Financial Resource Strain: Not on file  Food Insecurity: Not on file  Transportation Needs: Not on file  Physical Activity: Not on file  Stress: Not on file  Social Connections: Not on file     Family History: The patient's family history includes Atrial fibrillation in her father; Breast cancer (age of onset: 41) in her mother.  ROS:   Please see the history of present illness.     (+) Shortness of breath (+) Weight gain All other systems reviewed and are negative.  EKGs/Labs/Other Studies Reviewed:    The following studies were reviewed today:  06/08/2021  Echocardiogram:  1. Left ventricular ejection fraction, by estimation, is 55 to 60%. The  left ventricle has normal function. The left ventricle has no regional  wall motion abnormalities. Left ventricular diastolic parameters are  consistent with Grade II diastolic  dysfunction (pseudonormalization).   2. Right ventricular systolic function is normal. The right ventricular  size is normal. There is mildly elevated pulmonary artery systolic  pressure.   3. Left atrial size was moderately dilated.   4. Right atrial size was mildly dilated.   5. The mitral valve is grossly normal. Mild mitral valve regurgitation.  No evidence of mitral stenosis.  6. The aortic valve is grossly normal. Aortic valve regurgitation is  mild. No aortic stenosis is present.   7. The inferior vena cava is dilated in size with >50% respiratory  variability, suggesting right atrial pressure of 8 mmHg.   Comparison(s): Changes from prior study are noted. EF has normalized on current study.   Conclusion(s)/Recommendation(s): Otherwise normal echocardiogram, with minor abnormalities described in the report.   February 10, 2021 lab work AST 16, ALT 11, TSH 5.8 (elevated), free T4 0.8 (normal)  11/21/2020  Atrial Fibrillation Ablation: CONCLUSIONS: 1. Successful PVI 2. Successful ablation/isolation of the posterior wall 3. Intracardiac echo reveals small to moderate pericardial effusion adjacent to the LA/RV and posterior LV (present at the beginning of the case) 4. No early apparent complications.   EKG:  EKG is personally reviewed.  09/13/2021:  Normal sinus rhythm. 03/01/2021:  Sinus rhythm.  Intervals are normal.  Ventricular rate of 56 bpm.  Recent Labs: 02/10/2021: Hemoglobin 12.5; Platelets 173.0 05/31/2021: ALT 13; BUN  18; Creatinine, Ser 1.02; Potassium 5.2; Sodium 141 08/17/2021: TSH 7.31   Recent Lipid Panel    Component Value Date/Time   CHOL 201 (H) 02/10/2021 1002   TRIG 97.0 02/10/2021 1002   HDL 65.30 02/10/2021 1002   CHOLHDL 3 02/10/2021 1002   VLDL 19.4 02/10/2021 1002   LDLCALC 116 (H) 02/10/2021 1002   LDLCALC 125 (H) 01/22/2020 1115   LDLDIRECT 191.9 04/14/2013 0856    Physical Exam:    VS:  BP 136/70 (BP Location: Left Arm, Patient Position: Sitting, Cuff Size: Normal)   Pulse (!) 55   Ht '5\' 5"'$  (1.651 m)   Wt 193 lb (87.5 kg)   SpO2 96%   BMI 32.12 kg/m     Wt Readings from Last 3 Encounters:  09/13/21 193 lb (87.5 kg)  09/11/21 193 lb (87.5 kg)  08/17/21 191 lb 8 oz (86.9 kg)     GEN: Well nourished, well developed in no acute distress HEENT: Normal NECK: No JVD; No carotid bruits LYMPHATICS: No lymphadenopathy CARDIAC: RRR, no murmurs, rubs, gallops RESPIRATORY:  Clear to auscultation without rales, wheezing or rhonchi  ABDOMEN: Soft, non-tender, non-distended MUSCULOSKELETAL:  No edema; No deformity  SKIN: Warm and dry NEUROLOGIC:  Alert and oriented x 3 PSYCHIATRIC:  Normal affect        ASSESSMENT:    1. Persistent atrial fibrillation (HCC)    PLAN:    In order of problems listed above:  #Persistent atrial fibrillation Maintaining sinus rhythm after her ablation and while on amiodarone.  On Eliquis for stroke prophylaxis.  At this point, I would like to stop amiodarone to minimize her exposure to the antiarrhythmic drug and its potential off target effects.  I will have her touch base with Renee in clinic in 6 months to see how her heart rhythms been doing.  I will plan to see her back in 12 months as long as she is maintaining normal rhythm.  She will continue Eliquis for now.  If she has a recurrence, would plan to take her back to the EP lab for repeat ablation.  This has been discussed with the patient.  She will reach out if she has any episodes of  A-fib.  #Chronic systolic heart failure NYHA class II today.  Warm and dry on exam today.  Continue current medical therapy including Entresto, Toprol, Lasix.  Rhythm control is indicated as above.   Follow-up in 6 months with APP. Follow-up in 1 year with me.  Medication Adjustments/Labs and Tests Ordered: Current medicines are reviewed at length with the patient today.  Concerns regarding medicines are outlined above.  No orders of the defined types were placed in this encounter.  No orders of the defined types were placed in this encounter.   I,Mathew Stumpf,acting as a Education administrator for Vickie Epley, MD.,have documented all relevant documentation on the behalf of Vickie Epley, MD,as directed by  Vickie Epley, MD while in the presence of Vickie Epley, MD.  I, Vickie Epley, MD, have reviewed all documentation for this visit. The documentation on 09/13/21 for the exam, diagnosis, procedures, and orders are all accurate and complete.   Signed, Lars Mage, MD, [facility], [facility] 09/13/2021 1:52 PM    Electrophysiology [facility]

## 2021-09-14 ENCOUNTER — Ambulatory Visit (INDEPENDENT_AMBULATORY_CARE_PROVIDER_SITE_OTHER): Payer: Medicare HMO | Admitting: Pulmonary Disease

## 2021-09-14 DIAGNOSIS — R0609 Other forms of dyspnea: Secondary | ICD-10-CM

## 2021-09-14 LAB — PULMONARY FUNCTION TEST
DL/VA % pred: 93 %
DL/VA: 3.81 ml/min/mmHg/L
DLCO cor % pred: 66 %
DLCO cor: 13.32 ml/min/mmHg
DLCO unc % pred: 66 %
DLCO unc: 13.32 ml/min/mmHg
FEF 25-75 Post: 2.67 L/sec
FEF 25-75 Pre: 2.33 L/sec
FEF2575-%Change-Post: 14 %
FEF2575-%Pred-Post: 147 %
FEF2575-%Pred-Pre: 128 %
FEV1-%Change-Post: 3 %
FEV1-%Pred-Post: 84 %
FEV1-%Pred-Pre: 81 %
FEV1-Post: 1.91 L
FEV1-Pre: 1.85 L
FEV1FVC-%Change-Post: 3 %
FEV1FVC-%Pred-Pre: 112 %
FEV6-%Change-Post: 0 %
FEV6-%Pred-Post: 75 %
FEV6-%Pred-Pre: 76 %
FEV6-Post: 2.17 L
FEV6-Pre: 2.18 L
FEV6FVC-%Pred-Post: 104 %
FEV6FVC-%Pred-Pre: 104 %
FVC-%Change-Post: 0 %
FVC-%Pred-Post: 72 %
FVC-%Pred-Pre: 72 %
FVC-Post: 2.17 L
FVC-Pre: 2.18 L
Post FEV1/FVC ratio: 88 %
Post FEV6/FVC ratio: 100 %
Pre FEV1/FVC ratio: 85 %
Pre FEV6/FVC Ratio: 100 %
RV % pred: 69 %
RV: 1.6 L
TLC % pred: 79 %
TLC: 4.16 L

## 2021-09-14 NOTE — Patient Instructions (Signed)
Performed Full PFT Today.   ?

## 2021-09-18 ENCOUNTER — Encounter: Payer: Self-pay | Admitting: Pulmonary Disease

## 2021-09-18 ENCOUNTER — Ambulatory Visit (INDEPENDENT_AMBULATORY_CARE_PROVIDER_SITE_OTHER): Payer: Medicare HMO | Admitting: Pulmonary Disease

## 2021-09-18 ENCOUNTER — Other Ambulatory Visit: Payer: Self-pay | Admitting: Cardiology

## 2021-09-18 VITALS — BP 118/60 | HR 52 | Temp 98.4°F | Ht 65.0 in | Wt 190.2 lb

## 2021-09-18 DIAGNOSIS — R0609 Other forms of dyspnea: Secondary | ICD-10-CM | POA: Diagnosis not present

## 2021-09-21 ENCOUNTER — Telehealth: Payer: Self-pay | Admitting: Cardiology

## 2021-09-21 ENCOUNTER — Encounter: Payer: Self-pay | Admitting: Pulmonary Disease

## 2021-09-21 ENCOUNTER — Other Ambulatory Visit: Payer: Self-pay

## 2021-09-21 DIAGNOSIS — I4819 Other persistent atrial fibrillation: Secondary | ICD-10-CM

## 2021-09-21 MED ORDER — METOPROLOL SUCCINATE ER 25 MG PO TB24: 12.5000 mg | ORAL_TABLET | Freq: Every day | ORAL | 2 refills | Status: DC

## 2021-09-21 NOTE — Telephone Encounter (Signed)
*  STAT* If patient is at the pharmacy, call can be transferred to refill team.   1. Which medications need to be refilled? (please list name of each medication and dose if known)?   metoprolol succinate (TOPROL XL) 25 MG 24 hr tablet    2. Which pharmacy/location (including street and city if local pharmacy) is medication to be sent to? [id number] [place], [place] - [address]  3. Do they need a 30 day or 90 day supply? 988 Pt states she is completely out of these medication and the prescription has expired

## 2021-09-27 ENCOUNTER — Other Ambulatory Visit: Payer: Self-pay

## 2021-09-27 DIAGNOSIS — I4819 Other persistent atrial fibrillation: Secondary | ICD-10-CM

## 2021-09-27 MED ORDER — METOPROLOL SUCCINATE ER 25 MG PO TB24: 12.5000 mg | ORAL_TABLET | Freq: Every day | ORAL | 3 refills | Status: DC

## 2021-09-27 NOTE — Telephone Encounter (Signed)
Pt's medication was sent to pt's pharmacy as requested. Confirmation received.  °

## 2021-10-01 DIAGNOSIS — G4733 Obstructive sleep apnea (adult) (pediatric): Secondary | ICD-10-CM | POA: Diagnosis not present

## 2021-10-09 ENCOUNTER — Other Ambulatory Visit (INDEPENDENT_AMBULATORY_CARE_PROVIDER_SITE_OTHER): Payer: Medicare HMO

## 2021-10-09 DIAGNOSIS — E039 Hypothyroidism, unspecified: Secondary | ICD-10-CM

## 2021-10-09 DIAGNOSIS — E785 Hyperlipidemia, unspecified: Secondary | ICD-10-CM

## 2021-10-09 LAB — TSH: TSH: 2.68 u[IU]/mL (ref 0.35–5.50)

## 2021-10-19 ENCOUNTER — Telehealth: Payer: Self-pay | Admitting: Cardiology

## 2021-10-19 MED ORDER — ENTRESTO 24-26 MG PO TABS: 1.0000 | ORAL_TABLET | Freq: Two times a day (BID) | ORAL | 3 refills | Status: DC

## 2021-10-19 NOTE — Telephone Encounter (Signed)
*  STAT* If patient is at the pharmacy, call can be transferred to refill team.   1. Which medications need to be refilled? (please list name of each medication and dose if known)  new prescription for Entresto  2. Which pharmacy/location (including street and city if local pharmacy) is medication to be sent to?[facility] [place]  3. Do they need a 30 day or 90 day supply? 90 days and refills

## 2021-10-25 DIAGNOSIS — G4733 Obstructive sleep apnea (adult) (pediatric): Secondary | ICD-10-CM | POA: Diagnosis not present

## 2021-11-01 DIAGNOSIS — G4733 Obstructive sleep apnea (adult) (pediatric): Secondary | ICD-10-CM | POA: Diagnosis not present

## 2021-11-22 ENCOUNTER — Other Ambulatory Visit: Payer: Self-pay | Admitting: Internal Medicine

## 2021-11-22 DIAGNOSIS — E785 Hyperlipidemia, unspecified: Secondary | ICD-10-CM

## 2021-11-25 DIAGNOSIS — G4733 Obstructive sleep apnea (adult) (pediatric): Secondary | ICD-10-CM | POA: Diagnosis not present

## 2021-12-02 DIAGNOSIS — G4733 Obstructive sleep apnea (adult) (pediatric): Secondary | ICD-10-CM | POA: Diagnosis not present

## 2021-12-06 ENCOUNTER — Ambulatory Visit (INDEPENDENT_AMBULATORY_CARE_PROVIDER_SITE_OTHER): Payer: Medicare HMO | Admitting: Neurology

## 2021-12-06 ENCOUNTER — Other Ambulatory Visit: Payer: Self-pay | Admitting: Internal Medicine

## 2021-12-06 DIAGNOSIS — G4733 Obstructive sleep apnea (adult) (pediatric): Secondary | ICD-10-CM | POA: Diagnosis not present

## 2021-12-06 DIAGNOSIS — R0683 Snoring: Secondary | ICD-10-CM

## 2021-12-06 DIAGNOSIS — E785 Hyperlipidemia, unspecified: Secondary | ICD-10-CM

## 2021-12-06 DIAGNOSIS — I4891 Unspecified atrial fibrillation: Secondary | ICD-10-CM

## 2021-12-06 DIAGNOSIS — R351 Nocturia: Secondary | ICD-10-CM

## 2021-12-06 DIAGNOSIS — I420 Dilated cardiomyopathy: Secondary | ICD-10-CM

## 2021-12-06 DIAGNOSIS — I5021 Acute systolic (congestive) heart failure: Secondary | ICD-10-CM

## 2021-12-13 DIAGNOSIS — R351 Nocturia: Secondary | ICD-10-CM | POA: Insufficient documentation

## 2021-12-13 DIAGNOSIS — I420 Dilated cardiomyopathy: Secondary | ICD-10-CM | POA: Insufficient documentation

## 2021-12-13 DIAGNOSIS — G4733 Obstructive sleep apnea (adult) (pediatric): Secondary | ICD-10-CM | POA: Insufficient documentation

## 2021-12-13 DIAGNOSIS — R0683 Snoring: Secondary | ICD-10-CM | POA: Insufficient documentation

## 2021-12-13 NOTE — Procedures (Signed)
        IMPRESSION:  This HST confirms the presence of severe sleep apnea.  The data cannot show me if central apnea is present.  Hypoxia was recorded.  Continuation of a CPAP therapy is indicated.    RECOMMENDATION: An apnea treatment by dental device or inspire device is not feasible for this patient with her constellation of symptoms and findings.  I would want her to continue using her CPAP machine, autotitration: with 7-17 cm water pressure, mouth tape or chins strap can be used with nasal pillows.     INTERPRETING PHYSICIAN:   Larey Seat, MD   Medical Director of [facility] at [facility].

## 2021-12-13 NOTE — Addendum Note (Signed)
Addended by: Larey Seat on: 12/13/2021 04:51 PM   Modules accepted: Orders

## 2021-12-14 ENCOUNTER — Telehealth: Payer: Self-pay | Admitting: *Deleted

## 2021-12-14 NOTE — Telephone Encounter (Signed)
-----   Message from Larey Seat, MD sent at 12/13/2021  4:51 PM EDT ----- IMPRESSION:  This HST confirms the presence of severe sleep apnea.  The data cannot show me if central apnea is present.  Hypoxia was recorded.  Continuation of a CPAP therapy is indicated.   RECOMMENDATION: An apnea treatment by dental device or inspire device is not feasible for this patient with her constellation of symptoms and findings.  I would want her to continue using her humidified CPAP machine, autotitration: with 7-17 cm water pressure and 2 cm EPR, mouth tape or chins strap can be used with nasal pillows.   INTERPRETING PHYSICIAN:   Larey Seat, MD

## 2021-12-18 ENCOUNTER — Other Ambulatory Visit: Payer: Self-pay | Admitting: Internal Medicine

## 2021-12-18 DIAGNOSIS — E039 Hypothyroidism, unspecified: Secondary | ICD-10-CM

## 2021-12-25 DIAGNOSIS — G4733 Obstructive sleep apnea (adult) (pediatric): Secondary | ICD-10-CM | POA: Diagnosis not present

## 2022-01-29 ENCOUNTER — Telehealth: Payer: Self-pay | Admitting: Physical Medicine and Rehabilitation

## 2022-01-29 NOTE — Telephone Encounter (Signed)
Pt called requesting a call back to set an appt. Pt states she seen Dr Ernestina Patches about 5 years ago. Did not see on pt chart. Please call pt about this matter at [phone].

## 2022-01-29 NOTE — Telephone Encounter (Signed)
Spoke with patient and scheduled ov with Barnet Pall, NP for 01/30/22

## 2022-01-30 ENCOUNTER — Encounter: Payer: Self-pay | Admitting: Physical Medicine and Rehabilitation

## 2022-01-30 ENCOUNTER — Ambulatory Visit (INDEPENDENT_AMBULATORY_CARE_PROVIDER_SITE_OTHER): Payer: Medicare HMO | Admitting: Physical Medicine and Rehabilitation

## 2022-01-30 DIAGNOSIS — M25552 Pain in left hip: Secondary | ICD-10-CM

## 2022-01-30 DIAGNOSIS — M7062 Trochanteric bursitis, left hip: Secondary | ICD-10-CM | POA: Diagnosis not present

## 2022-01-30 NOTE — Progress Notes (Signed)
Shelly Bowers - 71 y.o. female MRN [id number]  Date of birth: 1950/09/07  Office Visit Note: Visit Date: 01/30/2022 PCP: Isaac Bliss, Rayford Halsted, MD Referred by: Isaac Bliss, Estel*  Subjective: Chief Complaint  Patient presents with   Left Hip - Pain   HPI: Shelly Bowers is a 71 y.o. female who comes in today for evaluation of chronic, worsening and severe left lateral hip and thigh pain. Patient last seen in our office in 2017. Pain ongoing for several years, increased over the last several months after working at [employer]. Pain exacerbated by movement, activity and laying on left side. She also reports severe pain when walking up stairs. Some relief with home exercise regimen, CBD topical cream, Celebrex and Ibuprofen. Lumbar spine MRI imaging from 2017 exhibits multi level facet arthropathy and lateral recess stenosis. No high grade spinal canal stenosis. History of previous lumbar epidural steroid injections and left greater trochanter injections performed in our office several years ago, reports good relief of pain with these procedures. Patient also informed me that she is not able to have Lidocaine with Epinephrine as she does have history of atrial fibrillation/ablation. Reports her symptoms are similar to previous issues with greater trochanteric bursitis. Patient denies focal weakness, numbness and tingling.        Review of Systems  Musculoskeletal:  Positive for myalgias.       Pain to left lateral hip/thigh  Neurological:  Negative for tingling, sensory change, focal weakness and weakness.  All other systems reviewed and are negative.  Otherwise per HPI.  Assessment & Plan: Visit Diagnoses:    ICD-10-CM   1. Greater trochanteric bursitis, left  M70.62 Ambulatory referral to Physical Medicine Rehab    2. Pain in left hip  M25.552        Plan: Findings:  Chronic, worsening and severe left lateral hip and thigh pain. Patient continues to  have severe pain despite good conservative therapies such as home exercise regimen, rest and use of medications. Patients clinical presentation and exam are consistent with left greater trochanteric bursitis, significant pain upon palpation of left greater trochanter upon exam today. Next step is to perform left greater trochanter injection under fluoroscopic guidance. If good relief with injection we can repeat this procedure infrequently as needed. If her pain persists we did discuss regimen of physical therapy and possibly obtaining new lumbar MRI imaging. If her pain declares itself as more of a radiculopathy she could benefit from lumbar epidural steroid injection. I did reassure patient that we do not use Lidocaine with Epinephrine in our injections. Patient encouraged to remain active as tolerated. No red flag symptoms noted upon exam today.     Meds & Orders: No orders of the defined types were placed in this encounter.   Orders Placed This Encounter  Procedures   Ambulatory referral to Physical Medicine Rehab    Follow-up: Return for Left greater trochanter injection.   Procedures: No procedures performed      Clinical History: EXAM: MRI LUMBAR SPINE WITHOUT CONTRAST   TECHNIQUE: Multiplanar, multisequence MR imaging of the lumbar spine was performed. No intravenous contrast was administered.   COMPARISON:  12/28/2008   FINDINGS: The vertebral bodies of the lumbar spine are normal in size. The vertebral bodies of the lumbar spine are normal in alignment. There is normal bone marrow signal demonstrated throughout the vertebra. There is degenerative disc disease with disc height loss at L1-2 and L4-5.   The spinal cord is  normal in signal and contour. The cord terminates normally at L1 . The nerve roots of the cauda equina and the filum terminale are normal.   The visualized portions of the SI joints are unremarkable.   The imaged intra-abdominal contents are  unremarkable.   T12-L1: No significant disc bulge. No evidence of neural foraminal stenosis. No central canal stenosis.   L1-L2: Mild broad-based disc bulge. Mild right facet arthropathy. Moderate left facet arthropathy and left lateral recess stenosis. Moderate left foraminal stenosis.   L2-L3: Mild broad-based disc bulge. Mild bilateral facet arthropathy. Left lateral recess stenosis. No evidence of neural foraminal stenosis. No central canal stenosis.   L3-L4: Mild broad-based disc bulge. Moderate bilateral facet arthropathy with lateral recess stenosis and mild central canal stenosis. No evidence of neural foraminal stenosis.   L4-L5: Mild broad-based disc bulge. Severe right and mild left facet arthropathy. Mild right foraminal stenosis. No left foraminal stenosis.   L5-S1: Mild broad-based disc bulge. No evidence of neural foraminal stenosis. No central canal stenosis.   IMPRESSION: 1. At L1-2 there is mild broad-based disc bulge. Mild right facet arthropathy. Moderate left facet arthropathy and left lateral recess stenosis. Moderate left foraminal stenosis. 2. At L3-4 there is mild broad-based disc bulge. Moderate bilateral facet arthropathy with lateral recess stenosis and mild central canal stenosis. 3. At L4-5 with a mild broad-based disc bulge. Severe right and mild left facet arthropathy. Mild right foraminal stenosis.     Electronically Signed   By: Kathreen Devoid   On: 04/28/2015 10:01   She reports that she has never smoked. She has never used smokeless tobacco.  Recent Labs    02/10/21 1002  HGBA1C 5.7    Objective:  VS:  HT:    WT:   BMI:     BP:   HR: bpm  TEMP: ( )  RESP:  Physical Exam Vitals and nursing note reviewed.  HENT:     Head: Normocephalic and atraumatic.     Right Ear: External ear normal.     Left Ear: External ear normal.     Nose: Nose normal.     Mouth/Throat:     Mouth: Mucous membranes are moist.  Eyes:     Extraocular  Movements: Extraocular movements intact.  Cardiovascular:     Rate and Rhythm: Normal rate.     Pulses: Normal pulses.  Pulmonary:     Effort: Pulmonary effort is normal.  Abdominal:     General: Abdomen is flat. There is no distension.  Musculoskeletal:        General: Tenderness present.     Cervical back: Normal range of motion.     Comments: Pt rises from seated position to standing without difficulty. Good lumbar range of motion. Strong distal strength without clonus, pain upon palpation of left greater trochanter. No pain with internal/external rotation of left hip. Sensation intact bilaterally. Walks independently, gait steady.   Skin:    General: Skin is warm and dry.     Capillary Refill: Capillary refill takes less than 2 seconds.  Neurological:     General: No focal deficit present.     Mental Status: She is alert and oriented to person, place, and time.  Psychiatric:        Mood and Affect: Mood normal.        Behavior: Behavior normal.     Ortho Exam  Imaging: No results found.  Past Medical/Family/Surgical/Social History: Medications & Allergies reviewed per EMR, new medications updated.  Patient Active Problem List   Diagnosis Date Noted   Dilated cardiomyopathy ([place]) 12/13/2021   Snoring 12/13/2021   Nocturia 12/13/2021   Severe obstructive sleep apnea-hypopnea syndrome 12/13/2021   Hypothyroidism 08/17/2021   Secondary hypercoagulable state ([place]) 16/38/4665   Acute systolic HF (heart failure) ([place]) 09/18/2020   Hypomagnesemia 09/18/2020   Hypokalemia 09/18/2020   Persistent atrial fibrillation ([place]) 08/24/2020   Atrial fibrillation with RVR ([place]) 08/23/2020   Macrocytosis without anemia 01/26/2020   Vitamin D deficiency 01/26/2020   SCC (squamous cell carcinoma) 04/25/2018   Hyperlipidemia 02/28/2018   Osteopenia 02/28/2018   Essential hypertension 10/02/2017   Chronic sciatica 08/21/2017   Menopausal symptom 08/21/2017   Granuloma of skin 01/04/2016    Dyslipidemia 04/20/2013   Encounter for preventive health examination 03/15/2011   HIP PAIN, LEFT 10/28/2008   Past Medical History:  Diagnosis Date   Atrial fibrillation ([place])    Cardiomyopathy ([place])    HTN (hypertension)    Hyperlipidemia    Osteopenia    Family History  Problem Relation Age of Onset   Breast cancer Mother 18   Atrial fibrillation Father    Past Surgical History:  Procedure Laterality Date   ATRIAL FIBRILLATION ABLATION N/A 11/21/2020   Procedure: ATRIAL FIBRILLATION ABLATION;  Surgeon: Vickie Epley, MD;  Location: [place] [facility];  Service: Cardiovascular;  Laterality: N/A;   AUGMENTATION MAMMAPLASTY Bilateral    BREAST EXCISIONAL BIOPSY Left    BUBBLE STUDY  08/25/2020   Procedure: BUBBLE STUDY;  Surgeon: Werner Lean, MD;  Location: [place];  Service: Cardiovascular;;   CARDIOVERSION N/A 08/25/2020   Procedure: CARDIOVERSION;  Surgeon: Werner Lean, MD;  Location: [facility];  Service: Cardiovascular;  Laterality: N/A;   CARDIOVERSION N/A 12/26/2020   Procedure: CARDIOVERSION;  Surgeon: Werner Lean, MD;  Location: [place];  Service: Cardiovascular;  Laterality: N/A;   INCISION AND DRAINAGE ABSCESS Left 12/27/2015   Procedure: INCISION AND DRAINAGE ABSCESS;  Surgeon: Daryll Brod, MD;  Location: [place];  Service: Orthopedics;  Laterality: Left;   TEE WITHOUT CARDIOVERSION N/A 08/25/2020   Procedure: TRANSESOPHAGEAL ECHOCARDIOGRAM (TEE);  Surgeon: Werner Lean, MD;  Location[facility];  Service: Cardiovascular;  Laterality: N/A;   Social History   Occupational History   Not on file  Tobacco Use   Smoking status: Never   Smokeless tobacco: Never  Substance and Sexual Activity   Alcohol use: Yes    Alcohol/week: 14.0 standard drinks of alcohol    Types: 14 Glasses of wine per week    Comment: 1 glass of wine nightly   Drug use: No   Sexual activity: Not on file

## 2022-01-30 NOTE — Progress Notes (Signed)
Numeric Pain Rating Scale and Functional Assessment Average Pain 4   In the last MONTH (on 0-10 scale) has pain interfered with the following?  1. General activity like being  able to carry out your everyday physical activities such as walking, climbing stairs, carrying groceries, or moving a chair?  Rating( varies, can get to a 10 )   Left hip pain. Has trouble climbing stairs, getting up and lying on left side.

## 2022-01-31 DIAGNOSIS — G4733 Obstructive sleep apnea (adult) (pediatric): Secondary | ICD-10-CM | POA: Diagnosis not present

## 2022-02-07 ENCOUNTER — Telehealth: Payer: Self-pay | Admitting: Physical Medicine and Rehabilitation

## 2022-02-07 ENCOUNTER — Encounter: Payer: Self-pay | Admitting: Internal Medicine

## 2022-02-07 ENCOUNTER — Ambulatory Visit (INDEPENDENT_AMBULATORY_CARE_PROVIDER_SITE_OTHER): Payer: Medicare HMO | Admitting: Internal Medicine

## 2022-02-07 VITALS — BP 120/70 | HR 62 | Temp 97.5°F | Wt 190.4 lb

## 2022-02-07 DIAGNOSIS — R0989 Other specified symptoms and signs involving the circulatory and respiratory systems: Secondary | ICD-10-CM

## 2022-02-07 DIAGNOSIS — J189 Pneumonia, unspecified organism: Secondary | ICD-10-CM | POA: Diagnosis not present

## 2022-02-07 LAB — POC COVID19 BINAXNOW: SARS Coronavirus 2 Ag: NEGATIVE

## 2022-02-07 LAB — POCT INFLUENZA A/B
Influenza A, POC: NEGATIVE
Influenza B, POC: NEGATIVE

## 2022-02-07 MED ORDER — AMOXICILLIN-POT CLAVULANATE 875-125 MG PO TABS: 1.0000 | ORAL_TABLET | Freq: Two times a day (BID) | ORAL | 0 refills | Status: AC

## 2022-02-07 MED ORDER — BENZONATATE 100 MG PO CAPS: 100.0000 mg | ORAL_CAPSULE | Freq: Two times a day (BID) | ORAL | 0 refills | Status: DC | PRN

## 2022-02-07 NOTE — Telephone Encounter (Signed)
Pt called to cancel her appt. Please call pt at [phone]

## 2022-02-07 NOTE — Progress Notes (Signed)
Acute office Visit     CC/Reason for Visit: URI symptoms, coughing spasms  HPI: Shelly Bowers is a 71 y.o. female who is coming in today for the above mentioned reasons.  For the past 3 days she has had a cough productive of clear sputum, chills, nasal congestion, headache.  She has not had ear pain or myalgias.  She has no recent travel or sick contacts.  The coughing spasms have been keeping her up at night and causing significant thoracic pain.   Past Medical/Surgical History: Past Medical History:  Diagnosis Date   Atrial fibrillation ([place])    Cardiomyopathy ([place])    HTN (hypertension)    Hyperlipidemia    Osteopenia     Past Surgical History:  Procedure Laterality Date   ATRIAL FIBRILLATION ABLATION N/A 11/21/2020   Procedure: ATRIAL FIBRILLATION ABLATION;  Surgeon: Vickie Epley, MD;  Location: [place] [facility];  Service: Cardiovascular;  Laterality: N/A;   AUGMENTATION MAMMAPLASTY Bilateral    BREAST EXCISIONAL BIOPSY Left    BUBBLE STUDY  08/25/2020   Procedure: BUBBLE STUDY;  Surgeon: Werner Lean, MD;  Location: [place];  Service: Cardiovascular;;   CARDIOVERSION N/A 08/25/2020   Procedure: CARDIOVERSION;  Surgeon: Werner Lean, MD;  Location: [place];  Service: Cardiovascular;  Laterality: N/A;   CARDIOVERSION N/A 12/26/2020   Procedure: CARDIOVERSION;  Surgeon: Werner Lean, MD;  Location: [place];  Service: Cardiovascular;  Laterality: N/A;   INCISION AND DRAINAGE ABSCESS Left 12/27/2015   Procedure: INCISION AND DRAINAGE ABSCESS;  Surgeon: Daryll Brod, MD;  Location: [place];  Service: Orthopedics;  Laterality: Left;   TEE WITHOUT CARDIOVERSION N/A 08/25/2020   Procedure: TRANSESOPHAGEAL ECHOCARDIOGRAM (TEE);  Surgeon: Werner Lean, MD;  Location: [facility];  Service: Cardiovascular;  Laterality: N/A;    Social History:  reports that she has never smoked. She has never used  smokeless tobacco. She reports current alcohol use of about 14.0 standard drinks of alcohol per week. She reports that she does not use drugs.  Allergies: Allergies  Allergen Reactions   Codeine Nausea Only   Hydrocodone Nausea And Vomiting    Family History:  Family History  Problem Relation Age of Onset   Breast cancer Mother 35   Atrial fibrillation Father      Current Outpatient Medications:    amoxicillin-clavulanate (AUGMENTIN) 875-125 MG tablet, Take 1 tablet by mouth 2 (two) times daily for 7 days., Disp: 14 tablet, Rfl: 0   atorvastatin (LIPITOR) 10 MG tablet, TAKE ONE TABLET BY MOUTH ONE TIME DAILY, Disp: 90 tablet, Rfl: 0   benzonatate (TESSALON) 100 MG capsule, Take 1 capsule (100 mg total) by mouth 2 (two) times daily as needed for cough., Disp: 20 capsule, Rfl: 0   CALCIUM-VITAMIN D PO, Take 1 tablet by mouth daily., Disp: , Rfl:    celecoxib (CELEBREX) 200 MG capsule, TAKE 1 CAPSULE BY MOUTH 2 TIMES DAILY AS NEEDED, Disp: 90 capsule, Rfl: 1   cetirizine (ZYRTEC) 10 MG tablet, Take 10 mg by mouth daily., Disp: , Rfl:    cholecalciferol (VITAMIN D3) 25 MCG (1000 UNIT) tablet, Take 1,000 Units by mouth daily., Disp: , Rfl:    Cyanocobalamin (B-12 PO), Take 1,000 mcg by mouth daily., Disp: , Rfl:    ELIQUIS 5 MG TABS tablet, TAKE 1 TABLET BY MOUTH TWICE A DAY, Disp: 180 tablet, Rfl: 3   folic acid (FOLVITE) 062 MCG tablet, Take 800 mcg by mouth  daily., Disp: , Rfl:    hydrochlorothiazide (HYDRODIURIL) 25 MG tablet, as needed., Disp: , Rfl:    ibuprofen (ADVIL,MOTRIN) 200 MG tablet, Take 400 mg by mouth every 6 (six) hours as needed for headache, fever or mild pain., Disp: , Rfl:    levothyroxine (SYNTHROID) 50 MCG tablet, TAKE 1 TABLET BY MOUTH EVERY DAY, Disp: 90 tablet, Rfl: 1   metoprolol succinate (TOPROL XL) 25 MG 24 hr tablet, Take 0.5 tablets (12.5 mg total) by mouth daily., Disp: 45 tablet, Rfl: 3   metroNIDAZOLE (METROGEL) 0.75 % gel, Apply 1 application topically  at bedtime., Disp: , Rfl:    naphazoline-pheniramine (VISINE) 0.025-0.3 % ophthalmic solution, Place 1 drop into both eyes daily as needed for eye irritation., Disp: , Rfl:    sacubitril-valsartan (ENTRESTO) 24-26 MG, Take 1 tablet by mouth 2 (two) times daily., Disp: 180 tablet, Rfl: 3   triamcinolone (NASACORT) 55 MCG/ACT AERO nasal inhaler, Place 2 sprays into the nose 2 (two) times daily as needed (Congestion)., Disp: , Rfl:    vitamin E 1000 UNIT capsule, Take 1,000 Units by mouth daily., Disp: , Rfl:    furosemide (LASIX) 20 MG tablet, TAKE 1 TABLET (20 MG TOTAL) BY MOUTH DAILY AS NEEDED FOR EDEMA (WEIGHT GAIN)., Disp: 30 tablet, Rfl: 5  Review of Systems:  Constitutional: Positive for  chills, diaphoresis, appetite change and fatigue.  HEENT: Denies photophobia, eye pain, redness, hearing loss, ear pain, mouth sores, trouble swallowing, neck pain, neck stiffness and tinnitus.   Respiratory: Denies SOB, DOE, chest tightness,  and wheezing.   Cardiovascular: Denies chest pain, palpitations and leg swelling.  Gastrointestinal: Denies nausea, vomiting, abdominal pain, diarrhea, constipation, blood in stool and abdominal distention.  Genitourinary: Denies dysuria, urgency, frequency, hematuria, flank pain and difficulty urinating.  Endocrine: Denies: hot or cold intolerance, sweats, changes in hair or nails, polyuria, polydipsia. Musculoskeletal: Denies myalgias, back pain, joint swelling, arthralgias and gait problem.  Skin: Denies pallor, rash and wound.  Neurological: Denies dizziness, seizures, syncope, weakness, light-headedness, numbness and headaches.  Hematological: Denies adenopathy. Easy bruising, personal or family bleeding history  Psychiatric/Behavioral: Denies suicidal ideation, mood changes, confusion, nervousness, sleep disturbance and agitation    Physical Exam: Vitals:   02/07/22 1507  BP: 120/70  Pulse: 62  Temp: (!) 97.5 F (36.4 C)  TempSrc: Oral  SpO2: 98%   Weight: 190 lb 6.4 oz (86.4 kg)    Body mass index is 31.68 kg/m.   Constitutional: NAD, calm, comfortable Eyes: PERRL, lids and conjunctivae normal ENMT: Mucous membranes are moist. Posterior pharynx is erythematous but clear of any exudate or lesions. Normal dentition. Tympanic membrane is pearly white, no erythema or bulging. Respiratory: Right lower lobe crackles.  Normal respiratory effort. No accessory muscle use.  Cardiovascular: Regular rate and rhythm, no murmurs / rubs / gallops. No extremity edema.  Psychiatric: Normal judgment and insight. Alert and oriented x 3. Normal mood.    Impression and Plan:  Pneumonia of right lower lobe due to infectious organism - Plan: amoxicillin-clavulanate (AUGMENTIN) 875-125 MG tablet, benzonatate (TESSALON) 100 MG capsule  Chest congestion - Plan: POC COVID-19 BinaxNow, POC Influenza A/B  -In office flu and COVID tests are negative. -She has right lower lobe crackles, I suspect she has early pneumonia.  I will send Augmentin for a week as well as Tessalon Perles to help with cough. -She knows to follow-up with [place] if symptoms do not resolve after 2 weeks.  Time spent:30 minutes reviewing chart, interviewing and  examining patient and formulating plan of care.      Lelon Frohlich, MD [facility]

## 2022-02-07 NOTE — Telephone Encounter (Signed)
I called and rescheduled.

## 2022-02-08 ENCOUNTER — Ambulatory Visit: Payer: Medicare HMO | Admitting: Physical Medicine and Rehabilitation

## 2022-02-20 ENCOUNTER — Ambulatory Visit: Payer: Self-pay

## 2022-02-20 ENCOUNTER — Ambulatory Visit: Payer: Medicare HMO | Admitting: Physical Medicine and Rehabilitation

## 2022-02-20 DIAGNOSIS — M25552 Pain in left hip: Secondary | ICD-10-CM

## 2022-02-20 MED ORDER — BUPIVACAINE HCL 0.25 % IJ SOLN
4.0000 mL | INTRAMUSCULAR | Status: AC | PRN
  Administered 2022-02-20: 4 mL via INTRA_ARTICULAR

## 2022-02-20 MED ORDER — TRIAMCINOLONE ACETONIDE 40 MG/ML IJ SUSP
40.0000 mg | INTRAMUSCULAR | Status: AC | PRN
  Administered 2022-02-20: 40 mg via INTRA_ARTICULAR

## 2022-02-20 MED ORDER — LIDOCAINE HCL 2 % IJ SOLN
4.0000 mL | INTRAMUSCULAR | Status: AC | PRN
  Administered 2022-02-20: 4 mL

## 2022-02-20 NOTE — Progress Notes (Signed)
Shelly Bowers - 71 y.o. female MRN [id number]  Date of birth: 1950/05/16  Office Visit Note: Visit Date: 02/20/2022 PCP: Isaac Bliss, Rayford Halsted, MD Referred by: Isaac Bliss, Estel*  Subjective: Chief Complaint  Patient presents with   Left Hip - Pain   HPI:  Shelly Bowers is a 71 y.o. female who comes in today at the request of Barnet Pall, FNP for planned Left greater trochanteric injection with fluoroscopic guidance.  The patient has failed conservative care including home exercise, medications, time and activity modification.  This injection will be diagnostic and hopefully therapeutic.  Please see requesting physician notes for further details and justification.   ROS Otherwise per HPI.  Assessment & Plan: Visit Diagnoses:    ICD-10-CM   1. Pain in left hip  M25.552 XR C-ARM NO REPORT    Large Joint Inj: L greater trochanter    CANCELED: Large Joint Inj: L hip joint      Plan: No additional findings.   Meds & Orders: No orders of the defined types were placed in this encounter.   Orders Placed This Encounter  Procedures   Large Joint Inj: L greater trochanter   XR C-ARM NO REPORT    Follow-up: Return for visit to requesting provider as needed.   Procedures: Large Joint Inj: L greater trochanter on 02/20/2022 1:05 PM Indications: pain and diagnostic evaluation Details: 22 G 3.5 in needle, fluoroscopy-guided lateral approach  Arthrogram: No  Medications: 4 mL lidocaine 2 %; 4 mL bupivacaine 0.25 %; 40 mg triamcinolone acetonide 40 MG/ML Outcome: tolerated well, no immediate complications  There was excellent flow of contrast outlined the greater trochanteric bursa without vascular uptake. Procedure, treatment alternatives, risks and benefits explained, specific risks discussed. Consent was given by the patient. Immediately prior to procedure a time out was called to verify the correct patient, procedure, equipment, support staff and site/side  marked as required. Patient was prepped and draped in the usual sterile fashion.          Clinical History: EXAM: MRI LUMBAR SPINE WITHOUT CONTRAST   TECHNIQUE: Multiplanar, multisequence MR imaging of the lumbar spine was performed. No intravenous contrast was administered.   COMPARISON:  12/28/2008   FINDINGS: The vertebral bodies of the lumbar spine are normal in size. The vertebral bodies of the lumbar spine are normal in alignment. There is normal bone marrow signal demonstrated throughout the vertebra. There is degenerative disc disease with disc height loss at L1-2 and L4-5.   The spinal cord is normal in signal and contour. The cord terminates normally at L1 . The nerve roots of the cauda equina and the filum terminale are normal.   The visualized portions of the SI joints are unremarkable.   The imaged intra-abdominal contents are unremarkable.   T12-L1: No significant disc bulge. No evidence of neural foraminal stenosis. No central canal stenosis.   L1-L2: Mild broad-based disc bulge. Mild right facet arthropathy. Moderate left facet arthropathy and left lateral recess stenosis. Moderate left foraminal stenosis.   L2-L3: Mild broad-based disc bulge. Mild bilateral facet arthropathy. Left lateral recess stenosis. No evidence of neural foraminal stenosis. No central canal stenosis.   L3-L4: Mild broad-based disc bulge. Moderate bilateral facet arthropathy with lateral recess stenosis and mild central canal stenosis. No evidence of neural foraminal stenosis.   L4-L5: Mild broad-based disc bulge. Severe right and mild left facet arthropathy. Mild right foraminal stenosis. No left foraminal stenosis.   L5-S1: Mild broad-based disc bulge. No  evidence of neural foraminal stenosis. No central canal stenosis.   IMPRESSION: 1. At L1-2 there is mild broad-based disc bulge. Mild right facet arthropathy. Moderate left facet arthropathy and left lateral  recess stenosis. Moderate left foraminal stenosis. 2. At L3-4 there is mild broad-based disc bulge. Moderate bilateral facet arthropathy with lateral recess stenosis and mild central canal stenosis. 3. At L4-5 with a mild broad-based disc bulge. Severe right and mild left facet arthropathy. Mild right foraminal stenosis.     Electronically Signed   By: Kathreen Devoid   On: 04/28/2015 10:01     Objective:  VS:  HT:    WT:   BMI:     BP:   HR: bpm  TEMP: ( )  RESP:  Physical Exam   Imaging: XR C-ARM NO REPORT  Result Date: 02/20/2022 Please see Notes tab for imaging impression.

## 2022-02-20 NOTE — Progress Notes (Signed)
Numeric Pain Rating Scale and Functional Assessment Average Pain 5   In the last MONTH (on 0-10 scale) has pain interfered with the following?  1. General activity like being  able to carry out your everyday physical activities such as walking, climbing stairs, carrying groceries, or moving a chair?  Rating(10)   +Driver- no driver required, -BT, -Dye Allergies.  Left hip pain. Cannot apply pressure to left hip

## 2022-02-22 ENCOUNTER — Encounter: Payer: Self-pay | Admitting: Internal Medicine

## 2022-03-02 DIAGNOSIS — G4733 Obstructive sleep apnea (adult) (pediatric): Secondary | ICD-10-CM | POA: Diagnosis not present

## 2022-03-13 ENCOUNTER — Other Ambulatory Visit: Payer: Self-pay | Admitting: Internal Medicine

## 2022-03-13 DIAGNOSIS — E785 Hyperlipidemia, unspecified: Secondary | ICD-10-CM

## 2022-03-15 DIAGNOSIS — E039 Hypothyroidism, unspecified: Secondary | ICD-10-CM | POA: Diagnosis not present

## 2022-03-15 DIAGNOSIS — I509 Heart failure, unspecified: Secondary | ICD-10-CM | POA: Diagnosis not present

## 2022-03-15 DIAGNOSIS — Z008 Encounter for other general examination: Secondary | ICD-10-CM | POA: Diagnosis not present

## 2022-03-15 DIAGNOSIS — Z8249 Family history of ischemic heart disease and other diseases of the circulatory system: Secondary | ICD-10-CM | POA: Diagnosis not present

## 2022-03-15 DIAGNOSIS — Z7901 Long term (current) use of anticoagulants: Secondary | ICD-10-CM | POA: Diagnosis not present

## 2022-03-15 DIAGNOSIS — I110 Hypertensive heart disease with heart failure: Secondary | ICD-10-CM | POA: Diagnosis not present

## 2022-03-15 DIAGNOSIS — I429 Cardiomyopathy, unspecified: Secondary | ICD-10-CM | POA: Diagnosis not present

## 2022-03-15 DIAGNOSIS — G4733 Obstructive sleep apnea (adult) (pediatric): Secondary | ICD-10-CM | POA: Diagnosis not present

## 2022-03-15 DIAGNOSIS — I4891 Unspecified atrial fibrillation: Secondary | ICD-10-CM | POA: Diagnosis not present

## 2022-03-15 DIAGNOSIS — E538 Deficiency of other specified B group vitamins: Secondary | ICD-10-CM | POA: Diagnosis not present

## 2022-03-15 DIAGNOSIS — M8580 Other specified disorders of bone density and structure, unspecified site: Secondary | ICD-10-CM | POA: Diagnosis not present

## 2022-03-15 DIAGNOSIS — E785 Hyperlipidemia, unspecified: Secondary | ICD-10-CM | POA: Diagnosis not present

## 2022-03-15 DIAGNOSIS — R69 Illness, unspecified: Secondary | ICD-10-CM | POA: Diagnosis not present

## 2022-04-02 ENCOUNTER — Other Ambulatory Visit: Payer: Self-pay | Admitting: Internal Medicine

## 2022-04-02 DIAGNOSIS — Z1231 Encounter for screening mammogram for malignant neoplasm of breast: Secondary | ICD-10-CM

## 2022-04-02 DIAGNOSIS — G4733 Obstructive sleep apnea (adult) (pediatric): Secondary | ICD-10-CM | POA: Diagnosis not present

## 2022-04-06 DIAGNOSIS — R69 Illness, unspecified: Secondary | ICD-10-CM | POA: Diagnosis not present

## 2022-04-11 ENCOUNTER — Encounter: Payer: Self-pay | Admitting: Internal Medicine

## 2022-04-11 ENCOUNTER — Ambulatory Visit (INDEPENDENT_AMBULATORY_CARE_PROVIDER_SITE_OTHER): Payer: Medicare HMO | Admitting: Internal Medicine

## 2022-04-11 VITALS — BP 120/78 | HR 59 | Temp 98.6°F | Ht 65.5 in | Wt 189.3 lb

## 2022-04-11 DIAGNOSIS — Z0000 Encounter for general adult medical examination without abnormal findings: Secondary | ICD-10-CM | POA: Diagnosis not present

## 2022-04-11 DIAGNOSIS — E782 Mixed hyperlipidemia: Secondary | ICD-10-CM | POA: Diagnosis not present

## 2022-04-11 DIAGNOSIS — I10 Essential (primary) hypertension: Secondary | ICD-10-CM

## 2022-04-11 DIAGNOSIS — D6869 Other thrombophilia: Secondary | ICD-10-CM

## 2022-04-11 DIAGNOSIS — I4819 Other persistent atrial fibrillation: Secondary | ICD-10-CM

## 2022-04-11 DIAGNOSIS — E039 Hypothyroidism, unspecified: Secondary | ICD-10-CM

## 2022-04-11 DIAGNOSIS — G4733 Obstructive sleep apnea (adult) (pediatric): Secondary | ICD-10-CM | POA: Diagnosis not present

## 2022-04-11 LAB — COMPREHENSIVE METABOLIC PANEL
ALT: 12 U/L (ref 0–35)
AST: 17 U/L (ref 0–37)
Albumin: 4.5 g/dL (ref 3.5–5.2)
Alkaline Phosphatase: 62 U/L (ref 39–117)
BUN: 13 mg/dL (ref 6–23)
CO2: 29 mEq/L (ref 19–32)
Calcium: 9.7 mg/dL (ref 8.4–10.5)
Chloride: 102 mEq/L (ref 96–112)
Creatinine, Ser: 0.88 mg/dL (ref 0.40–1.20)
GFR: 64.92 mL/min (ref >60.00)
Glucose, Bld: 92 mg/dL (ref 70–99)
Potassium: 4.6 mEq/L (ref 3.5–5.1)
Sodium: 139 mEq/L (ref 135–145)
Total Bilirubin: 0.9 mg/dL (ref 0.2–1.2)
Total Protein: 6.8 g/dL (ref 6.0–8.3)

## 2022-04-11 LAB — CBC WITH DIFFERENTIAL/PLATELET
Basophils Absolute: 0 10*3/uL (ref 0.0–0.1)
Basophils Relative: 0.6 % (ref 0.0–3.0)
Eosinophils Absolute: 0.1 10*3/uL (ref 0.0–0.7)
Eosinophils Relative: 2 % (ref 0.0–5.0)
HCT: 36.9 % (ref 36.0–46.0)
Hemoglobin: 12.6 g/dL (ref 12.0–15.0)
Lymphocytes Relative: 34.3 % (ref 12.0–46.0)
Lymphs Abs: 1.7 10*3/uL (ref 0.7–4.0)
MCHC: 34.2 g/dL (ref 30.0–36.0)
MCV: 101.2 fl — ABNORMAL HIGH (ref 78.0–100.0)
Monocytes Absolute: 0.5 10*3/uL (ref 0.1–1.0)
Monocytes Relative: 10.1 % (ref 3.0–12.0)
Neutro Abs: 2.6 10*3/uL (ref 1.4–7.7)
Neutrophils Relative %: 53 % (ref 43.0–77.0)
Platelets: 187 10*3/uL (ref 150.0–400.0)
RBC: 3.64 Mil/uL — ABNORMAL LOW (ref 3.87–5.11)
RDW: 14.2 % (ref 11.5–15.5)
WBC: 4.8 10*3/uL (ref 4.0–10.5)

## 2022-04-11 LAB — LIPID PANEL
Cholesterol: 218 mg/dL — ABNORMAL HIGH (ref 0–200)
HDL: 68 mg/dL (ref >39.00)
LDL Cholesterol: 126 mg/dL — ABNORMAL HIGH (ref 0–99)
NonHDL: 149.56
Total CHOL/HDL Ratio: 3
Triglycerides: 120 mg/dL (ref 0.0–149.0)
VLDL: 24 mg/dL (ref 0.0–40.0)

## 2022-04-11 LAB — TSH: TSH: 1.96 u[IU]/mL (ref 0.35–5.50)

## 2022-04-11 LAB — VITAMIN D 25 HYDROXY (VIT D DEFICIENCY, FRACTURES): VITD: 28.92 ng/mL — ABNORMAL LOW (ref 30.00–100.00)

## 2022-04-11 LAB — HEMOGLOBIN A1C: Hgb A1c MFr Bld: 5.5 % (ref 4.6–6.5)

## 2022-04-11 NOTE — Progress Notes (Signed)
Established Patient Office Visit     CC/Reason for Visit: Annual preventive exam and subsequent Medicare wellness visit  HPI: Shelly Bowers is a 72 y.o. female who is coming in today for the above mentioned reasons. Past Medical History is significant for: Hypertension, hyperlipidemia, A-fib, OSA, hypothyroidism.  She is doing well and has no acute concerns or complaints.  She has routine eye and dental care, no perceived hearing difficulty, overdue for COVID and RSV vaccines.  Mammogram scheduled for February, all other cancer screening is up-to-date.   Past Medical/Surgical History: Past Medical History:  Diagnosis Date   Atrial fibrillation ([place])    Cardiomyopathy ([place])    HTN (hypertension)    Hyperlipidemia    Osteopenia     Past Surgical History:  Procedure Laterality Date   ATRIAL FIBRILLATION ABLATION N/A 11/21/2020   Procedure: ATRIAL FIBRILLATION ABLATION;  Surgeon: Vickie Epley, MD;  Location: [facility];  Service: Cardiovascular;  Laterality: N/A;   AUGMENTATION MAMMAPLASTY Bilateral    BREAST EXCISIONAL BIOPSY Left    BUBBLE STUDY  08/25/2020   Procedure: BUBBLE STUDY;  Surgeon: Werner Lean, MD;  Location: [facility];  Service: Cardiovascular;;   CARDIOVERSION N/A 08/25/2020   Procedure: CARDIOVERSION;  Surgeon: Werner Lean, MD;  Location: [facility];  Service: Cardiovascular;  Laterality: N/A;   CARDIOVERSION N/A 12/26/2020   Procedure: CARDIOVERSION;  Surgeon: Werner Lean, MD;  Location: [facility];  Service: Cardiovascular;  Laterality: N/A;   INCISION AND DRAINAGE ABSCESS Left 12/27/2015   Procedure: INCISION AND DRAINAGE ABSCESS;  Surgeon: Daryll Brod, MD;  Location: [facility];  Service: Orthopedics;  Laterality: Left;   TEE WITHOUT CARDIOVERSION N/A 08/25/2020   Procedure: TRANSESOPHAGEAL ECHOCARDIOGRAM (TEE);  Surgeon: Werner Lean, MD;  [facility];  Service:  Cardiovascular;  Laterality: N/A;    Social History:  reports that she has never smoked. She has never used smokeless tobacco. She reports current alcohol use of about 14.0 standard drinks of alcohol per week. She reports that she does not use drugs.  Allergies: Allergies  Allergen Reactions   Codeine Nausea Only   Hydrocodone Nausea And Vomiting    Family History:  Family History  Problem Relation Age of Onset   Breast cancer Mother 57   Atrial fibrillation Father      Current Outpatient Medications:    atorvastatin (LIPITOR) 10 MG tablet, TAKE ONE TABLET BY MOUTH ONE TIME DAILY **NEED APPT FOR FURTHER REFILLS**, Disp: 90 tablet, Rfl: 0   CALCIUM-VITAMIN D PO, Take 1 tablet by mouth daily., Disp: , Rfl:    celecoxib (CELEBREX) 200 MG capsule, TAKE 1 CAPSULE BY MOUTH 2 TIMES DAILY AS NEEDED, Disp: 90 capsule, Rfl: 1   cetirizine (ZYRTEC) 10 MG tablet, Take 10 mg by mouth daily., Disp: , Rfl:    cholecalciferol (VITAMIN D3) 25 MCG (1000 UNIT) tablet, Take 1,000 Units by mouth daily., Disp: , Rfl:    Cyanocobalamin (B-12 PO), Take 1,000 mcg by mouth daily., Disp: , Rfl:    ELIQUIS 5 MG TABS tablet, TAKE 1 TABLET BY MOUTH TWICE A DAY, Disp: 180 tablet, Rfl: 3   folic acid (FOLVITE) 875 MCG tablet, Take 800 mcg by mouth daily., Disp: , Rfl:    hydrochlorothiazide (HYDRODIURIL) 25 MG tablet, as needed., Disp: , Rfl:    ibuprofen (ADVIL,MOTRIN) 200 MG tablet, Take 400 mg by mouth every 6 (six) hours as needed for headache, fever or mild pain., Disp: ,  Rfl:    levothyroxine (SYNTHROID) 50 MCG tablet, TAKE 1 TABLET BY MOUTH EVERY DAY, Disp: 90 tablet, Rfl: 1   metoprolol succinate (TOPROL XL) 25 MG 24 hr tablet, Take 0.5 tablets (12.5 mg total) by mouth daily., Disp: 45 tablet, Rfl: 3   metroNIDAZOLE (METROGEL) 0.75 % gel, Apply 1 application topically at bedtime., Disp: , Rfl:    naphazoline-pheniramine (VISINE) 0.025-0.3 % ophthalmic solution, Place 1 drop into both eyes daily as needed  for eye irritation., Disp: , Rfl:    sacubitril-valsartan (ENTRESTO) 24-26 MG, Take 1 tablet by mouth 2 (two) times daily., Disp: 180 tablet, Rfl: 3   triamcinolone (NASACORT) 55 MCG/ACT AERO nasal inhaler, Place 2 sprays into the nose 2 (two) times daily as needed (Congestion)., Disp: , Rfl:    vitamin E 1000 UNIT capsule, Take 1,000 Units by mouth daily., Disp: , Rfl:    furosemide (LASIX) 20 MG tablet, TAKE 1 TABLET (20 MG TOTAL) BY MOUTH DAILY AS NEEDED FOR EDEMA (WEIGHT GAIN)., Disp: 30 tablet, Rfl: 5  Review of Systems:  Negative unless indicated in HPI.   Physical Exam: Vitals:   04/11/22 1035  BP: 120/78  Pulse: (!) 59  Temp: 98.6 F (37 C)  TempSrc: Oral  SpO2: 99%  Weight: 189 lb 4.8 oz (85.9 kg)  Height: 5' 5.5" (1.664 m)    Body mass index is 31.02 kg/m.   Physical Exam Vitals reviewed.  Constitutional:      General: She is not in acute distress.    Appearance: Normal appearance. She is not ill-appearing, toxic-appearing or diaphoretic.  HENT:     Head: Normocephalic.     Right Ear: Tympanic membrane, ear canal and external ear normal. There is no impacted cerumen.     Left Ear: Tympanic membrane, ear canal and external ear normal. There is no impacted cerumen.     Nose: Nose normal.     Mouth/Throat:     Mouth: Mucous membranes are moist.     Pharynx: Oropharynx is clear. No oropharyngeal exudate or posterior oropharyngeal erythema.  Eyes:     General: No scleral icterus.       Right eye: No discharge.        Left eye: No discharge.     Conjunctiva/sclera: Conjunctivae normal.     Pupils: Pupils are equal, round, and reactive to light.  Neck:     Vascular: No carotid bruit.  Cardiovascular:     Rate and Rhythm: Normal rate and regular rhythm.     Pulses: Normal pulses.     Heart sounds: Normal heart sounds.  Pulmonary:     Effort: Pulmonary effort is normal. No respiratory distress.     Breath sounds: Normal breath sounds.  Abdominal:     General:  Abdomen is flat. Bowel sounds are normal.     Palpations: Abdomen is soft.  Musculoskeletal:        General: Normal range of motion.     Cervical back: Normal range of motion.  Skin:    General: Skin is warm and dry.     Capillary Refill: Capillary refill takes less than 2 seconds.  Neurological:     General: No focal deficit present.     Mental Status: She is alert and oriented to person, place, and time. Mental status is at baseline.  Psychiatric:        Mood and Affect: Mood normal.        Behavior: Behavior normal.  Thought Content: Thought content normal.        Judgment: Judgment normal.      Subsequent Medicare wellness visit   1. Risk factors, based on past  M,S,F -cardiovascular disease risk factors include hypertension, hyperlipidemia, obesity   2.  Physical activities: Sedentary other than mild walking   3.  Depression/mood: Stable, not depressed   4.  Hearing: No perceived issues   5.  ADL's: Independent in all ADLs   6.  Fall risk: Low fall risk   7.  Home safety: No problems identified   8.  Height weight, and visual acuity: height and weight as above, vision:  Vision Screening   Right eye Left eye Both eyes  Without correction     With correction '20/20 20/20 20/20 '$     9.  Counseling: Advised to update all age-appropriate immunizations and cancer screening   10. Lab orders based on risk factors: Laboratory update will be reviewed   11. Referral : None today   12. Care plan: Follow-up with me in 6 months   13. Cognitive assessment: No cognitive impairment   14. Screening: Patient provided with a written and personalized 5-10 year screening schedule in the AVS. yes   15. Provider List Update: PCP, cardiology, pulmonology  16. Advance Directives: Full code   17. Opioids: Patient is not on any opioid prescriptions and has no risk factors for a substance use disorder.   [place] Office Visit from 04/11/2022 in [place] at  Why  PHQ-9 Total Score 0          02/09/2021    1:00 PM 08/16/2021   10:53 PM 08/17/2021   10:12 AM 02/07/2022    3:19 PM 04/11/2022   10:40 AM  Fall Risk  Falls in the past year? 1 0 0 0 0  Was there an injury with Fall? 1  0 0 0  Fall Risk Category Calculator 2  0 0 0  Fall Risk Category (Retired) Moderate  Low Low   (RETIRED) Patient Fall Risk Level    Low fall risk   Patient at Risk for Falls Due to   No Fall Risks No Fall Risks No Fall Risks  Fall risk Follow up   Falls evaluation completed Falls evaluation completed Falls evaluation completed    Impression and Plan:  Encounter for preventive health examination  Essential hypertension - Plan: CBC with Differential/Platelet, Comprehensive metabolic panel, Hemoglobin A1c, VITAMIN D 25 Hydroxy (Vit-D Deficiency, Fractures)  Persistent atrial fibrillation (HCC)  Mixed hyperlipidemia - Plan: Lipid panel  Severe obstructive sleep apnea-hypopnea syndrome  Secondary hypercoagulable state ([place])  Acquired hypothyroidism - Plan: TSH  -Recommend routine eye and dental care. -Immunizations: Advised to update COVID and RSV at pharmacy -Healthy lifestyle discussed in detail. -Labs to be updated today. -Colon cancer screening: Negative Cologuard 02/2021 -Breast cancer screening: Scheduled for February -Cervical cancer screening: 07/2020 -Lung cancer screening: Not applicable -Prostate cancer screening: Not applicable -DEXA: 11/2117 .     Lelon Frohlich, MD [facility] at [facility]

## 2022-04-12 ENCOUNTER — Other Ambulatory Visit: Payer: Self-pay | Admitting: Internal Medicine

## 2022-04-12 DIAGNOSIS — E559 Vitamin D deficiency, unspecified: Secondary | ICD-10-CM

## 2022-04-12 MED ORDER — VITAMIN D (ERGOCALCIFEROL) 1.25 MG (50000 UNIT) PO CAPS: 50000.0000 [IU] | ORAL_CAPSULE | ORAL | 0 refills | Status: AC

## 2022-04-13 ENCOUNTER — Other Ambulatory Visit: Payer: Self-pay

## 2022-04-13 DIAGNOSIS — E559 Vitamin D deficiency, unspecified: Secondary | ICD-10-CM

## 2022-04-13 DIAGNOSIS — E782 Mixed hyperlipidemia: Secondary | ICD-10-CM

## 2022-04-17 NOTE — Progress Notes (Signed)
DME 

## 2022-05-03 NOTE — Progress Notes (Deleted)
Electrophysiology Office Follow up Visit Note:    Date:  05/03/2022   ID:  Shelly Bowers, DOB 1951/03/23, MRN [id number]  PCP:  Isaac Bliss, Rayford Halsted, MD  [facility] Cardiologist:  Minus Breeding, MD  [facility] Electrophysiologist:  Vickie Epley, MD    Interval History:    Shelly Bowers is a 72 y.o. female who presents for a follow up visit. They were last seen in clinic 09/13/2021 for persistent AF. At that appointment, her amoidarone was stopped. She takes Eliquis for stroke ppx.       Past Medical History:  Diagnosis Date   Atrial fibrillation ([place])    Cardiomyopathy ([place])    HTN (hypertension)    Hyperlipidemia    Osteopenia     Past Surgical History:  Procedure Laterality Date   ATRIAL FIBRILLATION ABLATION N/A 11/21/2020   Procedure: ATRIAL FIBRILLATION ABLATION;  Surgeon: Vickie Epley, MD;  Location: [facility];  Service: Cardiovascular;  Laterality: N/A;   AUGMENTATION MAMMAPLASTY Bilateral    BREAST EXCISIONAL BIOPSY Left    BUBBLE STUDY  08/25/2020   Procedure: BUBBLE STUDY;  Surgeon: Werner Lean, MD;  Location: [facility];  Service: Cardiovascular;;   CARDIOVERSION N/A 08/25/2020   Procedure: CARDIOVERSION;  Surgeon: Werner Lean, MD;  Location: [facility] [facility];  Service: Cardiovascular;  Laterality: N/A;   CARDIOVERSION N/A 12/26/2020   Procedure: CARDIOVERSION;  Surgeon: Werner Lean, MD;  Location: [place];  Service: Cardiovascular;  Laterality: N/A;   INCISION AND DRAINAGE ABSCESS Left 12/27/2015   Procedure: INCISION AND DRAINAGE ABSCESS;  Surgeon: Daryll Brod, MD;  Location: [facility];  Service: Orthopedics;  Laterality: Left;   TEE WITHOUT CARDIOVERSION N/A 08/25/2020   Procedure: TRANSESOPHAGEAL ECHOCARDIOGRAM (TEE);  Surgeon: Werner Lean, MD;  Location: [facility];  Service: Cardiovascular;  Laterality: N/A;    Current Medications: No outpatient  medications have been marked as taking for the 05/04/22 encounter (Appointment) with Vickie Epley, MD.     Allergies:   Codeine and Hydrocodone   Social History   Socioeconomic History   Marital status: Married    Spouse name: Not on file   Number of children: Not on file   Years of education: Not on file   Highest education level: Not on file  Occupational History   Not on file  Tobacco Use   Smoking status: Never   Smokeless tobacco: Never  Substance and Sexual Activity   Alcohol use: Yes    Alcohol/week: 14.0 standard drinks of alcohol    Types: 14 Glasses of wine per week    Comment: 1 glass of wine nightly   Drug use: No   Sexual activity: Not on file  Other Topics Concern   Not on file  Social History Narrative   Left handed   Caffeine use: tea sometimes   Soda-zero sugar/no caffeine ginger ale   1-2 cups coffee per day (decaf)   Social Determinants of Health   Financial Resource Strain: Not on file  Food Insecurity: Not on file  Transportation Needs: Not on file  Physical Activity: Not on file  Stress: Not on file  Social Connections: Not on file     Family History: The patient's family history includes Atrial fibrillation in her father; Breast cancer (age of onset: 17) in her mother.  ROS:   Please see the history of present illness.    All other systems reviewed and are negative.  EKGs/Labs/Other Studies Reviewed:  The following studies were reviewed today:   EKG:  The ekg ordered today demonstrates ***  Recent Labs: 04/11/2022: ALT 12; BUN 13; Creatinine, Ser 0.88; Hemoglobin 12.6; Platelets 187.0; Potassium 4.6; Sodium 139; TSH 1.96  Recent Lipid Panel    Component Value Date/Time   CHOL 218 (H) 04/11/2022 1059   TRIG 120.0 04/11/2022 1059   HDL 68.00 04/11/2022 1059   CHOLHDL 3 04/11/2022 1059   VLDL 24.0 04/11/2022 1059   LDLCALC 126 (H) 04/11/2022 1059   LDLCALC 125 (H) 01/22/2020 1115   LDLDIRECT 191.9 04/14/2013 0856     Physical Exam:    VS:  There were no vitals taken for this visit.    Wt Readings from Last 3 Encounters:  04/11/22 189 lb 4.8 oz (85.9 kg)  02/07/22 190 lb 6.4 oz (86.4 kg)  09/18/21 190 lb 3.2 oz (86.3 kg)     GEN: *** Well nourished, well developed in no acute distress CARDIAC: ***RRR, no murmurs, rubs, gallops        ASSESSMENT:    No diagnosis found. PLAN:    In order of problems listed above:   #Persistent AF Maintaining sinus rhythm after ablation in 10/2020. Off amiodarone. Continue eliquis.  #HFrEF NYHA class I-II. Warm and dry on exam. Continue current medical therapy.  Follow up with APP in 1 year.      Medication Adjustments/Labs and Tests Ordered: Current medicines are reviewed at length with the patient today.  Concerns regarding medicines are outlined above.  No orders of the defined types were placed in this encounter.  No orders of the defined types were placed in this encounter.    Signed, Lars Mage, MD, [facility], [place] 05/03/2022 8:53 PM    Electrophysiology [facility]

## 2022-05-04 ENCOUNTER — Encounter: Payer: Self-pay | Admitting: Cardiology

## 2022-05-04 ENCOUNTER — Ambulatory Visit: Payer: Medicare HMO | Attending: Cardiology | Admitting: Cardiology

## 2022-05-04 VITALS — BP 142/80 | HR 58 | Ht 65.5 in | Wt 190.4 lb

## 2022-05-04 DIAGNOSIS — I4819 Other persistent atrial fibrillation: Secondary | ICD-10-CM | POA: Diagnosis not present

## 2022-05-04 DIAGNOSIS — I5022 Chronic systolic (congestive) heart failure: Secondary | ICD-10-CM

## 2022-05-04 NOTE — Progress Notes (Signed)
Electrophysiology Office Follow up Visit Note:    Date:  05/04/2022   ID:  Shelly Bowers, DOB 11/16/1950, MRN [id number]  PCP:  Isaac Bliss, Rayford Halsted, MD  [facility] Cardiologist:  Minus Breeding, MD  [facility] Electrophysiologist:  Vickie Epley, MD    Interval History:    Shelly Bowers is a 72 y.o. female who presents for a follow up visit. They were last seen in clinic 09/13/2021 for persistent AF. At that appointment, her amoidarone was stopped. She takes Eliquis for stroke ppx.  Today, she is feeling overall well. She confirms that she has stopped Amiodarone and continues to be compliant with Entresto, Eliquis, and Celebrex.   She reports that she tends to have blood on the tissue when blowing her nose in the mornings. She has also had nosebleeds in the past. She attributes this to her taking blood thinners.  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  She also uses ibuprofen occasionally.  She is interested in avoiding long-term exposure anticoagulation given the associated bleeding risk.  She denies any palpitations, chest pain, shortness of breath, or peripheral edema. No lightheadedness, headaches, syncope, orthopnea, or PND.  Past Medical History:  Diagnosis Date   Atrial fibrillation ([place])    Cardiomyopathy ([place])    HTN (hypertension)    Hyperlipidemia    Osteopenia     Past Surgical History:  Procedure Laterality Date   ATRIAL FIBRILLATION ABLATION N/A 11/21/2020   Procedure: ATRIAL FIBRILLATION ABLATION;  Surgeon: Vickie Epley, MD;  Location: [place] [facility];  Service: Cardiovascular;  Laterality: N/A;   AUGMENTATION MAMMAPLASTY Bilateral    BREAST EXCISIONAL BIOPSY Left    BUBBLE STUDY  08/25/2020   Procedure: BUBBLE STUDY;  Surgeon: Werner Lean, MD;  Location: [place];  Service: Cardiovascular;;   CARDIOVERSION N/A 08/25/2020   Procedure: CARDIOVERSION;  Surgeon:  Werner Lean, MD;  Location: [facility];  Service: Cardiovascular;  Laterality: N/A;   CARDIOVERSION N/A 12/26/2020   Procedure: CARDIOVERSION;  Surgeon: Werner Lean, MD;  Location: [place];  Service: Cardiovascular;  Laterality: N/A;   INCISION AND DRAINAGE ABSCESS Left 12/27/2015   Procedure: INCISION AND DRAINAGE ABSCESS;  Surgeon: Daryll Brod, MD;  Location: [place];  Service: Orthopedics;  Laterality: Left;   TEE WITHOUT CARDIOVERSION N/A 08/25/2020   Procedure: TRANSESOPHAGEAL ECHOCARDIOGRAM (TEE);  Surgeon: Werner Lean, MD;  Location: [facility];  Service: Cardiovascular;  Laterality: N/A;    Current Medications: Current Meds  Medication Sig   atorvastatin (LIPITOR) 10 MG tablet TAKE ONE TABLET BY MOUTH ONE TIME DAILY **NEED APPT FOR FURTHER REFILLS**   CALCIUM-VITAMIN D PO Take 1 tablet by mouth daily.   celecoxib (CELEBREX) 200 MG capsule TAKE 1 CAPSULE BY MOUTH 2 TIMES DAILY AS NEEDED   cetirizine (ZYRTEC) 10 MG tablet Take 10 mg by mouth daily.   cholecalciferol (VITAMIN D3) 25 MCG (1000 UNIT) tablet Take 1,000 Units by mouth daily.   Cyanocobalamin (B-12 PO) Take 1,000 mcg by mouth daily.   ELIQUIS 5 MG TABS tablet TAKE 1 TABLET BY MOUTH TWICE A DAY   folic acid (FOLVITE) Q000111Q MCG tablet Take 800 mcg by mouth daily.   hydrochlorothiazide (HYDRODIURIL) 25 MG tablet as needed.   ibuprofen (ADVIL,MOTRIN) 200 MG tablet Take 400 mg by mouth every 6 (six) hours as needed for headache, fever or mild pain.   levothyroxine (SYNTHROID) 50 MCG tablet TAKE 1 TABLET BY  MOUTH EVERY DAY   metoprolol succinate (TOPROL XL) 25 MG 24 hr tablet Take 0.5 tablets (12.5 mg total) by mouth daily.   metroNIDAZOLE (METROGEL) 0.75 % gel Apply 1 application topically at bedtime.   naphazoline-pheniramine (VISINE) 0.025-0.3 % ophthalmic solution Place 1 drop into both eyes daily as needed for eye irritation.   sacubitril-valsartan (ENTRESTO) 24-26 MG  Take 1 tablet by mouth 2 (two) times daily.   triamcinolone (NASACORT) 55 MCG/ACT AERO nasal inhaler Place 2 sprays into the nose 2 (two) times daily as needed (Congestion).   Vitamin D, Ergocalciferol, (DRISDOL) 1.25 MG (50000 UNIT) CAPS capsule Take 1 capsule (50,000 Units total) by mouth every 7 (seven) days for 12 doses.   vitamin E 1000 UNIT capsule Take 1,000 Units by mouth daily.     Allergies:   Codeine and Hydrocodone   Social History   Socioeconomic History   Marital status: Married    Spouse name: Not on file   Number of children: Not on file   Years of education: Not on file   Highest education level: Not on file  Occupational History   Not on file  Tobacco Use   Smoking status: Never   Smokeless tobacco: Never  Substance and Sexual Activity   Alcohol use: Yes    Alcohol/week: 14.0 standard drinks of alcohol    Types: 14 Glasses of wine per week    Comment: 1 glass of wine nightly   Drug use: No   Sexual activity: Not on file  Other Topics Concern   Not on file  Social History Narrative   Left handed   Caffeine use: tea sometimes   Soda-zero sugar/no caffeine ginger ale   1-2 cups coffee per day (decaf)   Social Determinants of Health   Financial Resource Strain: Not on file  Food Insecurity: Not on file  Transportation Needs: Not on file  Physical Activity: Not on file  Stress: Not on file  Social Connections: Not on file     Family History: The patient's family history includes Atrial fibrillation in her father; Breast cancer (age of onset: 21) in her mother.  ROS:   Please see the history of present illness.    (+) nose bleeds All other systems reviewed and are negative.  EKGs/Labs/Other Studies Reviewed:    The following studies were reviewed today:   EKG:  The ekg ordered today demonstrates sinus rhythm.  Recent Labs: 04/11/2022: ALT 12; BUN 13; Creatinine, Ser 0.88; Hemoglobin 12.6; Platelets 187.0; Potassium 4.6; Sodium 139; TSH 1.96   Recent Lipid Panel    Component Value Date/Time   CHOL 218 (H) 04/11/2022 1059   TRIG 120.0 04/11/2022 1059   HDL 68.00 04/11/2022 1059   CHOLHDL 3 04/11/2022 1059   VLDL 24.0 04/11/2022 1059   LDLCALC 126 (H) 04/11/2022 1059   LDLCALC 125 (H) 01/22/2020 1115   LDLDIRECT 191.9 04/14/2013 0856    Physical Exam:    VS:  BP (!) 142/80   Pulse (!) 58   Ht 5' 5.5" (1.664 m)   Wt 190 lb 6.4 oz (86.4 kg)   SpO2 98%   BMI 31.20 kg/m     Wt Readings from Last 3 Encounters:  05/04/22 190 lb 6.4 oz (86.4 kg)  04/11/22 189 lb 4.8 oz (85.9 kg)  02/07/22 190 lb 6.4 oz (86.4 kg)     GEN:  Well nourished, well developed in no acute distress CARDIAC: RRR, no murmurs, rubs, gallops  ASSESSMENT:    1. Persistent atrial fibrillation ([place])   2. Chronic systolic congestive heart failure (HCC)    PLAN:    In order of problems listed above:   #Persistent AF Maintaining sinus rhythm after ablation in 10/2020. Off amiodarone. Continue eliquis.   ----------------------  I have seen Shelly Bowers in the office today who is being considered for a Watchman left atrial appendage closure device. I believe they will benefit from this procedure given their history of atrial fibrillation, CHA2DS2-VASc score of 4 and unadjusted ischemic stroke rate of 4.8% per year. Unfortunately, the patient is not felt to be a long term anticoagulation candidate secondary to chronic NSAID use and intermittent nose bleeds. The patient's chart has been reviewed and I feel that they would be a candidate for short term oral anticoagulation after Watchman implant.   It is my belief that after undergoing a LAA closure procedure, Shelly Bowers will not need long term anticoagulation which eliminates anticoagulation side effects and major bleeding risk.   Procedural risks for the Watchman implant have been reviewed with the patient including a 0.5% risk of stroke, <1% risk of perforation and <1%  risk of device embolization. Other risks include bleeding, vascular damage, tamponade, worsening renal function, and death. The patient understands these risk and wishes to proceed.     The published clinical data on the safety and effectiveness of WATCHMAN include but are not limited to the following: - Holmes DR, Mechele Claude, Sick P et al. for the PROTECT AF Investigators. Percutaneous closure of the left atrial appendage versus warfarin therapy for prevention of stroke in patients with atrial fibrillation: a randomised non-inferiority trial. Lancet 2009; 374: 534-42. Mechele Claude, Doshi SK, Abelardo Diesel D et al. on behalf of the PROTECT AF Investigators. Percutaneous Left Atrial Appendage Closure for Stroke Prophylaxis in Patients With Atrial Fibrillation 2.3-Year Follow-up of the PROTECT AF (Watchman Left Atrial Appendage System for Embolic Protection in Patients With Atrial Fibrillation) Trial. Circulation 2013; 127:720-729. - Alli O, Doshi S,  Kar S, Reddy VY, Sievert H et al. Quality of Life Assessment in the Randomized PROTECT AF (Percutaneous Closure of the Left Atrial Appendage Versus Warfarin Therapy for Prevention of Stroke in Patients With Atrial Fibrillation) Trial of Patients at Risk for Stroke With Nonvalvular Atrial Fibrillation. J Am Coll Cardiol 2013; N8865744. Vertell Limber DR, Tarri Abernethy, Price M, [place], Sievert H, Doshi S, Huber K, Reddy V. Prospective randomized evaluation of the Watchman left atrial appendage Device in patients with atrial fibrillation versus long-term warfarin therapy; the PREVAIL trial. Journal of the [employer] of Cardiology, Vol. 4, No. 1, 2014, 1-11. - Kar S, Doshi SK, Sadhu A, Horton R, Osorio J et al. Primary outcome evaluation of a next-generation left atrial appendage closure device: results from the PINNACLE FLX trial. Circulation 2021;143(18)1754-1762.    After today's visit with the patient which was dedicated solely for shared decision making visit  regarding LAA closure device, the patient decided to proceed with the LAA appendage closure procedure scheduled to be done in the near future at [facility].  She will not need a CT scan prior to the procedure.  Will use her preablation CT scan for measurements.  HAS-BLED score 3 Hypertension Yes  Abnormal renal and liver function (Dialysis, transplant, Cr >2.26 mg/dL /Cirrhosis or Bilirubin >2x Normal or AST/ALT/AP >3x Normal) No  Stroke No  Bleeding No  Labile INR (Unstable/high INR) No  Elderly (>65) Yes  Drugs  or alcohol (? 8 drinks/week, anti-plt or NSAID) Yes   CHA2DS2-VASc Score = 4  The patient's score is based upon: CHF History: 1 HTN History: 1 Diabetes History: 0 Stroke History: 0 Vascular Disease History: 0 Age Score: 1 Gender Score: 1  #HFrEF NYHA class I-II. Warm and dry on exam. Continue current medical therapy.  Follow up with APP in 1 year.     Medication Adjustments/Labs and Tests Ordered: Current medicines are reviewed at length with the patient today.  Concerns regarding medicines are outlined above.  Orders Placed This Encounter  Procedures   EKG 12-Lead   No orders of the defined types were placed in this encounter.  I,Rachel Rivera,acting as a scribe for Vickie Epley, MD.,have documented all relevant documentation on the behalf of Vickie Epley, MD,as directed by  Vickie Epley, MD while in the presence of Vickie Epley, MD.  I, Vickie Epley, MD, have reviewed all documentation for this visit. The documentation on 05/04/22 for the exam, diagnosis, procedures, and orders are all accurate and complete.   Signed, Lars Mage, MD, [facility], [facility] 05/04/2022 8:40 AM    Electrophysiology [facility]

## 2022-05-04 NOTE — Patient Instructions (Signed)
Medication Instructions:  Your physician recommends that you continue on your current medications as directed. Please refer to the Current Medication list given to you today.  *If you need a refill on your cardiac medications before your next appointment, please call your pharmacy*   Follow-Up: At [facility], you and your health needs are our priority.  As part of our continuing mission to provide you with exceptional heart care, we have created designated Provider Care Teams.  These Care Teams include your primary Cardiologist (physician) and Advanced Practice Providers (APPs -  Physician Assistants and Nurse Practitioners) who all work together to provide you with the care you need, when you need it.  Your next appointment:   The Nurse Navigator, Lenice Llamas, RN, will be reaching out to you to schedule next steps for your Watchman workup. If you have any questions she can be reached at [phone].

## 2022-05-07 ENCOUNTER — Telehealth: Payer: Self-pay

## 2022-05-07 NOTE — Telephone Encounter (Signed)
[employer] report of August 2022 cCT:

## 2022-05-08 ENCOUNTER — Telehealth: Payer: Self-pay

## 2022-05-08 NOTE — Patient Instructions (Signed)
Visit Information  Thank you for taking time to visit with me today. Please don't hesitate to contact me if I can be of assistance to you.   Following are the goals we discussed today:   Goals Addressed             This Visit's Progress    COMPLETED: Care Coordination Activities-No follow up required       Interventions Today    Flowsheet Row Most Recent Value  Chronic Disease   Chronic disease during today's visit Hypertension (HTN)  General Interventions   General Interventions Discussed/Reviewed General Interventions Discussed, Health Screening, Doctor Visits  Doctor Visits Discussed/Reviewed Doctor Visits Discussed, Annual Wellness Visits  Health Screening Mammogram, Colonoscopy  Exercise Interventions   Exercise Discussed/Reviewed Exercise Discussed  Education Interventions   Education Provided Provided Education  Mental Health Interventions   Mental Health Discussed/Reviewed Depression  Pharmacy Interventions   Pharmacy Dicussed/Reviewed Medications and their functions  Safety Interventions   Safety Discussed/Reviewed Safety Discussed               If you are experiencing a Mental Health or [place] or need someone to talk to, please call the Suicide and Crisis Lifeline: 78   Patient verbalizes understanding of instructions and care plan provided today and agrees to view in [place]. Active MyChart status and patient understanding of how to access instructions and care plan via MyChart confirmed with patient.     No further follow up required: decline  Jone Baseman, RN, MSN [place] [facility] Care Management Coordinator Direct Line [phone]

## 2022-05-08 NOTE — Patient Outreach (Signed)
  Care Coordination   Initial Visit Note   05/08/2022 Name: JAQLYN GRUENHAGEN MRN: [id number] DOB: 03/12/1951  LILLIANNA SABEL is a 72 y.o. year old female who sees Isaac Bliss, Rayford Halsted, MD for primary care. I spoke with  Trisha Mangle by phone today.  What matters to the patients health and wellness today?  none    Goals Addressed             This Visit's Progress    COMPLETED: Care Coordination Activities-No follow up required       Interventions Today    Flowsheet Row Most Recent Value  Chronic Disease   Chronic disease during today's visit Hypertension (HTN)  General Interventions   General Interventions Discussed/Reviewed General Interventions Discussed, Health Screening, Doctor Visits  Doctor Visits Discussed/Reviewed Doctor Visits Discussed, Annual Wellness Visits  Health Screening Mammogram, Colonoscopy  Exercise Interventions   Exercise Discussed/Reviewed Exercise Discussed  Education Interventions   Education Provided Provided Education  Mental Health Interventions   Mental Health Discussed/Reviewed Depression  Pharmacy Interventions   Pharmacy Dicussed/Reviewed Medications and their functions  Safety Interventions   Safety Discussed/Reviewed Safety Discussed              SDOH assessments and interventions completed:  Yes  SDOH Interventions Today    Flowsheet Row Most Recent Value  SDOH Interventions   Food Insecurity Interventions Intervention Not Indicated  Housing Interventions Intervention Not Indicated        Care Coordination Interventions:  Yes, provided   Follow up plan: No further intervention required.   Encounter Outcome:  Pt. Visit Completed  Jone Baseman, RN, MSN [place] [facility] Care Management Coordinator Direct Line [phone]

## 2022-05-10 DIAGNOSIS — G4733 Obstructive sleep apnea (adult) (pediatric): Secondary | ICD-10-CM | POA: Diagnosis not present

## 2022-05-15 ENCOUNTER — Telehealth: Payer: Self-pay | Admitting: Internal Medicine

## 2022-05-15 MED ORDER — ATORVASTATIN CALCIUM 20 MG PO TABS: 20.0000 mg | ORAL_TABLET | Freq: Every day | ORAL | 1 refills | Status: DC

## 2022-05-15 NOTE — Telephone Encounter (Signed)
Pt was told to increase 10 mg to 20 mg she was taking 2 10 mg tablet of atorastatin and will need new rx for atorvastatin (LIPITOR) 20 MG tablet  [id number]- [place], [place] - [address] Phone: [phone] Fax: [phone]

## 2022-05-15 NOTE — Telephone Encounter (Signed)
Rx sent 

## 2022-05-17 ENCOUNTER — Telehealth: Payer: Self-pay

## 2022-05-17 NOTE — Telephone Encounter (Signed)
Called to discuss Watchman planning.   Left message to call back.

## 2022-05-18 ENCOUNTER — Other Ambulatory Visit: Payer: Self-pay

## 2022-05-18 DIAGNOSIS — G4733 Obstructive sleep apnea (adult) (pediatric): Secondary | ICD-10-CM | POA: Diagnosis not present

## 2022-05-18 DIAGNOSIS — I4819 Other persistent atrial fibrillation: Secondary | ICD-10-CM

## 2022-05-18 DIAGNOSIS — I4891 Unspecified atrial fibrillation: Secondary | ICD-10-CM

## 2022-05-18 NOTE — Telephone Encounter (Signed)
The patient wishes to proceed with Watchman implant 07/12/2022. Scheduled her for pre-procedure visit with Kathyrn Drown, NP 3/22. She was grateful for assistance and agreed with plan.

## 2022-05-22 ENCOUNTER — Ambulatory Visit: Payer: Medicare HMO

## 2022-06-08 DIAGNOSIS — G4733 Obstructive sleep apnea (adult) (pediatric): Secondary | ICD-10-CM | POA: Diagnosis not present

## 2022-06-15 ENCOUNTER — Ambulatory Visit: Payer: Medicare HMO

## 2022-06-16 DIAGNOSIS — G4733 Obstructive sleep apnea (adult) (pediatric): Secondary | ICD-10-CM | POA: Diagnosis not present

## 2022-06-18 ENCOUNTER — Other Ambulatory Visit: Payer: Self-pay | Admitting: Internal Medicine

## 2022-06-18 ENCOUNTER — Other Ambulatory Visit: Payer: Self-pay | Admitting: Cardiology

## 2022-06-18 DIAGNOSIS — E782 Mixed hyperlipidemia: Secondary | ICD-10-CM

## 2022-06-18 DIAGNOSIS — E039 Hypothyroidism, unspecified: Secondary | ICD-10-CM

## 2022-06-18 DIAGNOSIS — I10 Essential (primary) hypertension: Secondary | ICD-10-CM

## 2022-06-18 DIAGNOSIS — I4891 Unspecified atrial fibrillation: Secondary | ICD-10-CM

## 2022-06-18 DIAGNOSIS — M85851 Other specified disorders of bone density and structure, right thigh: Secondary | ICD-10-CM

## 2022-06-18 DIAGNOSIS — I5021 Acute systolic (congestive) heart failure: Secondary | ICD-10-CM

## 2022-06-19 NOTE — Telephone Encounter (Signed)
Prescription refill request for Eliquis received. Indication: afib  Last office visit: lambert, 05/04/2022 Scr: 0.88, 04/11/2022 Age: 72 yo  Weight: 86.4 kg   Refill sent.

## 2022-06-25 ENCOUNTER — Ambulatory Visit: Payer: Medicare HMO | Attending: Cardiology | Admitting: Cardiology

## 2022-06-25 VITALS — BP 144/76 | HR 54 | Ht 65.5 in | Wt 193.4 lb

## 2022-06-25 DIAGNOSIS — I5022 Chronic systolic (congestive) heart failure: Secondary | ICD-10-CM

## 2022-06-25 DIAGNOSIS — Z01818 Encounter for other preprocedural examination: Secondary | ICD-10-CM

## 2022-06-25 DIAGNOSIS — I4819 Other persistent atrial fibrillation: Secondary | ICD-10-CM | POA: Diagnosis not present

## 2022-06-25 DIAGNOSIS — I10 Essential (primary) hypertension: Secondary | ICD-10-CM | POA: Diagnosis not present

## 2022-06-25 DIAGNOSIS — I4891 Unspecified atrial fibrillation: Secondary | ICD-10-CM

## 2022-06-25 NOTE — Patient Instructions (Signed)
Medication Instructions:  Your physician recommends that you continue on your current medications as directed. Please refer to the Current Medication list given to you today.  *If you need a refill on your cardiac medications before your next appointment, please call your pharmacy*   Lab Work: TODAY: BMET, CBC If you have labs (blood work) drawn today and your tests are completely normal, you will receive your results only by: [place] (if you have MyChart) OR A paper copy in the mail If you have any lab test that is abnormal or we need to change your treatment, we will call you to review the results.   Testing/Procedures: SEE INSTRUCTION LETTER   Follow-Up: At [facility], you and your health needs are our priority.  As part of our continuing mission to provide you with exceptional heart care, we have created designated Provider Care Teams.  These Care Teams include your primary Cardiologist (physician) and Advanced Practice Providers (APPs -  Physician Assistants and Nurse Practitioners) who all work together to provide you with the care you need, when you need it.  We recommend signing up for the patient portal called "MyChart".  Sign up information is provided on this After Visit Summary.  MyChart is used to connect with patients for Virtual Visits (Telemedicine).  Patients are able to view lab/test results, encounter notes, upcoming appointments, etc.  Non-urgent messages can be sent to your provider as well.   To learn more about what you can do with MyChart, go to [email or web address].    Your next appointment:   POST PROCEDURE FOLLOW-UP

## 2022-06-25 NOTE — Progress Notes (Signed)
[facility]                                     Cardiology Office Note:    Date:  06/25/2022   ID:  Shelly Bowers, DOB 1950/10/23, MRN [id number]  PCP:  Isaac Bliss, Rayford Halsted, MD  [facility] Cardiologist:  Minus Breeding, MD  [facility] Electrophysiologist:  Vickie Epley, MD   Referring MD: Isaac Bliss, Estel*   Chief Complaint  Patient presents with   Follow-up    Pre LAAO    History of Present Illness:    Shelly Bowers is a 72 y.o. female with a hx of cardiomyopathy with EF normalization per last echo, HLD, HTN, and atrial fibrillation s/p AF ablation who is now scheduled for LAAO closure 4/18 with Dr. Quentin Ore.   Pre ablation CT with anatomy suitable for implant. Today she is here alone and reports that she has been well with no breakthrough atrial fibrillation. No recent nosebleeds, bleeding in stool or urine, no chest pain, palpitations, LE edema, SOB, orthopnea, dizziness, or syncope.   Past Medical History:  Diagnosis Date   Atrial fibrillation    Cardiomyopathy    HTN (hypertension)    Hyperlipidemia    Osteopenia     Past Surgical History:  Procedure Laterality Date   ATRIAL FIBRILLATION ABLATION N/A 11/21/2020   Procedure: ATRIAL FIBRILLATION ABLATION;  Surgeon: Vickie Epley, MD;  Location: [facility];  Service: Cardiovascular;  Laterality: N/A;   AUGMENTATION MAMMAPLASTY Bilateral    BREAST EXCISIONAL BIOPSY Left    BUBBLE STUDY  08/25/2020   Procedure: BUBBLE STUDY;  Surgeon: Werner Lean, MD;  Location: [place];  Service: Cardiovascular;;   CARDIOVERSION N/A 08/25/2020   Procedure: CARDIOVERSION;  Surgeon: Werner Lean, MD;  Location: [facility];  Service: Cardiovascular;  Laterality: N/A;   CARDIOVERSION N/A 12/26/2020   Procedure: CARDIOVERSION;  Surgeon: Werner Lean, MD;  Location: [place];  Service: Cardiovascular;  Laterality: N/A;   INCISION AND DRAINAGE  ABSCESS Left 12/27/2015   Procedure: INCISION AND DRAINAGE ABSCESS;  Surgeon: Daryll Brod, MD;  Location: [place];  Service: Orthopedics;  Laterality: Left;   TEE WITHOUT CARDIOVERSION N/A 08/25/2020   Procedure: TRANSESOPHAGEAL ECHOCARDIOGRAM (TEE);  Surgeon: Werner Lean, MD;  Location: [facility];  Service: Cardiovascular;  Laterality: N/A;    Current Medications: Current Meds  Medication Sig   apixaban (ELIQUIS) 5 MG TABS tablet TAKE 1 TABLET BY MOUTH TWICE A DAY   atorvastatin (LIPITOR) 20 MG tablet Take 1 tablet (20 mg total) by mouth daily.   CALCIUM-VITAMIN D PO Take 1 tablet by mouth daily.   celecoxib (CELEBREX) 200 MG capsule TAKE 1 CAPSULE BY MOUTH 2 TIMES DAILY AS NEEDED   cetirizine (ZYRTEC) 10 MG tablet Take 10 mg by mouth daily.   cholecalciferol (VITAMIN D3) 25 MCG (1000 UNIT) tablet Take 1,000 Units by mouth daily.   Cyanocobalamin (B-12 PO) Take 1,000 mcg by mouth daily.   folic acid (FOLVITE) [id number] MCG tablet Take 800 mcg by mouth daily.   hydrochlorothiazide (HYDRODIURIL) 25 MG tablet as needed.   ibuprofen (ADVIL,MOTRIN) 200 MG tablet Take 400 mg by mouth every 6 (six) hours as needed for headache, fever or mild pain.   levothyroxine (SYNTHROID) 50 MCG tablet TAKE 1 TABLET BY MOUTH EVERY DAY   metoprolol succinate (TOPROL XL) 25  MG 24 hr tablet Take 0.5 tablets (12.5 mg total) by mouth daily.   metroNIDAZOLE (METROGEL) 0.75 % gel Apply 1 application topically at bedtime.   naphazoline-pheniramine (VISINE) 0.025-0.3 % ophthalmic solution Place 1 drop into both eyes daily as needed for eye irritation.   sacubitril-valsartan (ENTRESTO) 24-26 MG Take 1 tablet by mouth 2 (two) times daily.   triamcinolone (NASACORT) 55 MCG/ACT AERO nasal inhaler Place 2 sprays into the nose 2 (two) times daily as needed (Congestion).   Vitamin D, Ergocalciferol, (DRISDOL) 1.25 MG (50000 UNIT) CAPS capsule Take 1 capsule (50,000 Units total) by mouth every 7 (seven)  days for 12 doses.   vitamin E 1000 UNIT capsule Take 1,000 Units by mouth daily.     Allergies:   Codeine and Hydrocodone   Social History   Socioeconomic History   Marital status: Married    Spouse name: Not on file   Number of children: Not on file   Years of education: Not on file   Highest education level: Not on file  Occupational History   Not on file  Tobacco Use   Smoking status: Never   Smokeless tobacco: Never  Substance and Sexual Activity   Alcohol use: Yes    Alcohol/week: 14.0 standard drinks of alcohol    Types: 14 Glasses of wine per week    Comment: 1 glass of wine nightly   Drug use: No   Sexual activity: Not on file  Other Topics Concern   Not on file  Social History Narrative   Left handed   Caffeine use: tea sometimes   Soda-zero sugar/no caffeine ginger ale   1-2 cups coffee per day (decaf)   Social Determinants of Health   Financial Resource Strain: Not on file  Food Insecurity: No Food Insecurity (05/08/2022)   Hunger Vital Sign    Worried About Running Out of Food in the Last Year: Never true    Ran Out of Food in the Last Year: Never true  Transportation Needs: Not on file  Physical Activity: Not on file  Stress: Not on file  Social Connections: Not on file     Family History: The patient's family history includes Atrial fibrillation in her father; Breast cancer (age of onset: 106) in her mother.  ROS:   Please see the history of present illness.    All other systems reviewed and are negative.  EKGs/Labs/Other Studies Reviewed:    The following studies were reviewed today:  CT 11-25-20:  IMPRESSION: 1. There is normal pulmonary vein drainage into the left atrium with ostial measurements above (3 right pulmonary veins and 2 left).   2. There is no thrombus in the left atrial appendage.  Moderate LAE   3. The esophagus runs in the left atrial midline and is not in proximity to any of the pulmonary vein ostia.   4. No PFO/ASD.    5. Normal coronary origin. Right dominance.   6. CAC score of 19.7 Which is 47 percentile for age-, race-, and sex-matched controls.   7. Aortic atherosclerosis.   8. Moderate RAE, mild RVE.    EKG:  EKG is ordered today.  The ekg ordered today demonstrates sinus bradycardia with HR 51bpm.   Recent Labs: 04/11/2022: ALT 12; BUN 13; Creatinine, Ser 0.88; Hemoglobin 12.6; Platelets 187.0; Potassium 4.6; Sodium 139; TSH 1.96   Recent Lipid Panel    Component Value Date/Time   CHOL 218 (H) 04/11/2022 1059   TRIG 120.0 04/11/2022 1059  HDL 68.00 04/11/2022 1059   CHOLHDL 3 04/11/2022 1059   VLDL 24.0 04/11/2022 1059   LDLCALC 126 (H) 04/11/2022 1059   LDLCALC 125 (H) 01/22/2020 1115   LDLDIRECT 191.9 04/14/2013 0856   Risk Assessment/Calculations:    HAS-BLED score 3 Hypertension Yes  Abnormal renal and liver function (Dialysis, transplant, Cr >2.26 mg/dL /Cirrhosis or Bilirubin >2x Normal or AST/ALT/AP >3x Normal) No  Stroke No  Bleeding No  Labile INR (Unstable/high INR) No  Elderly (>65) Yes  Drugs or alcohol (? 8 drinks/week, anti-plt or NSAID) Yes    CHA2DS2-VASc Score = 4  The patient's score is based upon: CHF History: 1 HTN History: 1 Diabetes History: 0 Stroke History: 0 Vascular Disease History: 0 Age Score: 1 Gender Score: 1   Physical Exam:    VS:  BP (!) 144/76   Pulse (!) 54   Ht 5' 5.5" (1.664 m)   Wt 193 lb 6.4 oz (87.7 kg)   SpO2 96%   BMI 31.69 kg/m     Wt Readings from Last 3 Encounters:  06/25/22 193 lb 6.4 oz (87.7 kg)  05/04/22 190 lb 6.4 oz (86.4 kg)  04/11/22 189 lb 4.8 oz (85.9 kg)    General: Well developed, well nourished, NAD Lungs:Clear to ausculation bilaterally. No wheezes, rales, or rhonchi. Breathing is unlabored. Cardiovascular: RRR with S1 S2. No murmurs Extremities: No edema.  Neuro: Alert and oriented. No focal deficits. No facial asymmetry. MAE spontaneously. Psych: Responds to questions appropriately with normal  affect.    ASSESSMENT/PLAN:    Persistent atrial fibrillation: s/p AF ablation and maintaining SR/SB today. She is scheduled for LAAO closure 4/18 with Dr. Quentin Ore due to nosebleeds secondary to Eliquis use. Pre instruction letter reviewed with understanding. CHG soap given. Obtain CBC, BMET today.   HFrEF/NICM: Patient doing well with NYHA class I symptoms. EF normalized on last echocardiogram. Continue current regimen.   HTN: Stable with no changes needed at this time.   Medication Adjustments/Labs and Tests Ordered: Current medicines are reviewed at length with the patient today.  Concerns regarding medicines are outlined above.  Orders Placed This Encounter  Procedures   Basic metabolic panel   CBC   EKG 12-Lead   No orders of the defined types were placed in this encounter.   Patient Instructions  Medication Instructions:  Your physician recommends that you continue on your current medications as directed. Please refer to the Current Medication list given to you today.  *If you need a refill on your cardiac medications before your next appointment, please call your pharmacy*   Lab Work: TODAY: BMET, CBC If you have labs (blood work) drawn today and your tests are completely normal, you will receive your results only by: [place] (if you have MyChart) OR A paper copy in the mail If you have any lab test that is abnormal or we need to change your treatment, we will call you to review the results.   Testing/Procedures: SEE INSTRUCTION LETTER   Follow-Up: At [facility], you and your health needs are our priority.  As part of our continuing mission to provide you with exceptional heart care, we have created designated Provider Care Teams.  These Care Teams include your primary Cardiologist (physician) and Advanced Practice Providers (APPs -  Physician Assistants and Nurse Practitioners) who all work together to provide you with the care you need, when you  need it.  We recommend signing up for the patient portal called "MyChart".  Sign up information is provided on this After Visit Summary.  MyChart is used to connect with patients for Virtual Visits (Telemedicine).  Patients are able to view lab/test results, encounter notes, upcoming appointments, etc.  Non-urgent messages can be sent to your provider as well.   To learn more about what you can do with MyChart, go to [email or web address].    Your next appointment:   POST PROCEDURE FOLLOW-UP   Signed, Kathyrn Drown, NP  06/25/2022 11:55 AM    [place]

## 2022-06-26 ENCOUNTER — Telehealth: Payer: Self-pay

## 2022-06-26 LAB — CBC
Hematocrit: 36.4 % (ref 34.0–46.6)
Hemoglobin: 11.8 g/dL (ref 11.1–15.9)
MCH: 33.4 pg — ABNORMAL HIGH (ref 26.6–33.0)
MCHC: 32.4 g/dL (ref 31.5–35.7)
MCV: 103 fL — ABNORMAL HIGH (ref 79–97)
Platelets: 197 10*3/uL (ref 150–450)
RBC: 3.53 x10E6/uL — ABNORMAL LOW (ref 3.77–5.28)
RDW: 13.2 % (ref 11.7–15.4)
WBC: 4.9 10*3/uL (ref 3.4–10.8)

## 2022-06-26 LAB — BASIC METABOLIC PANEL
BUN/Creatinine Ratio: 17 (ref 12–28)
BUN: 14 mg/dL (ref 8–27)
CO2: 25 mmol/L (ref 20–29)
Calcium: 9.9 mg/dL (ref 8.7–10.3)
Chloride: 103 mmol/L (ref 96–106)
Creatinine, Ser: 0.82 mg/dL (ref 0.57–1.00)
Glucose: 84 mg/dL (ref 70–99)
Potassium: 5.2 mmol/L (ref 3.5–5.2)
Sodium: 141 mmol/L (ref 134–144)
eGFR: 75 mL/min/{1.73_m2} (ref >59)

## 2022-06-28 ENCOUNTER — Other Ambulatory Visit: Payer: Self-pay | Admitting: Internal Medicine

## 2022-06-28 DIAGNOSIS — E559 Vitamin D deficiency, unspecified: Secondary | ICD-10-CM

## 2022-06-30 ENCOUNTER — Other Ambulatory Visit: Payer: Self-pay | Admitting: Cardiology

## 2022-06-30 DIAGNOSIS — I4819 Other persistent atrial fibrillation: Secondary | ICD-10-CM

## 2022-07-02 NOTE — Telephone Encounter (Signed)
Rx refill sent to pharmacy. 

## 2022-07-03 ENCOUNTER — Ambulatory Visit
Admission: RE | Admit: 2022-07-03 | Discharge: 2022-07-03 | Disposition: A | Discharge status: Home / Self Care | Payer: Medicare HMO | Source: Ambulatory Visit | Attending: Internal Medicine | Admitting: Internal Medicine

## 2022-07-03 DIAGNOSIS — Z1231 Encounter for screening mammogram for malignant neoplasm of breast: Secondary | ICD-10-CM

## 2022-07-03 NOTE — Telephone Encounter (Signed)
LAAO measurements per Dr. Edwyna Perfect from August 2022 CT:  Christell Constant, MD  Henrietta Dine, RN Wind[place] Appearance Ostial dimensions (28 mm x 24 mm) with 24 mm Wall distance, sufficient dimensions for a 31 mm Device.   No issues with transeptal puncture.

## 2022-07-09 DIAGNOSIS — G4733 Obstructive sleep apnea (adult) (pediatric): Secondary | ICD-10-CM | POA: Diagnosis not present

## 2022-07-11 ENCOUNTER — Telehealth: Payer: Self-pay

## 2022-07-11 NOTE — Telephone Encounter (Signed)
Confirmed procedure date of 07/12/2022. Confirmed arrival time of 1100 for procedure time at 1330. Reviewed pre-procedure instructions with patient. The patient understands to call if questions/concerns arise prior to procedure.

## 2022-07-12 ENCOUNTER — Inpatient Hospital Stay (HOSPITAL_COMMUNITY): Payer: Medicare HMO | Admitting: Certified Registered Nurse Anesthetist

## 2022-07-12 ENCOUNTER — Inpatient Hospital Stay (HOSPITAL_COMMUNITY): Payer: Medicare HMO

## 2022-07-12 ENCOUNTER — Encounter (HOSPITAL_COMMUNITY): Payer: Self-pay | Admitting: Cardiology

## 2022-07-12 ENCOUNTER — Inpatient Hospital Stay (HOSPITAL_COMMUNITY)
Admission: RE | Admit: 2022-07-12 | Discharge: 2022-07-12 | DRG: 274 | Disposition: A | Discharge status: Home / Self Care | Payer: Medicare HMO | Attending: Cardiology | Admitting: Cardiology

## 2022-07-12 ENCOUNTER — Other Ambulatory Visit: Payer: Self-pay

## 2022-07-12 ENCOUNTER — Encounter (HOSPITAL_COMMUNITY)
Admission: RE | Disposition: A | Discharge status: Home / Self Care | Payer: Self-pay | Source: Home / Self Care | Attending: Cardiology

## 2022-07-12 ENCOUNTER — Other Ambulatory Visit (HOSPITAL_COMMUNITY): Payer: Self-pay

## 2022-07-12 DIAGNOSIS — E785 Hyperlipidemia, unspecified: Secondary | ICD-10-CM | POA: Diagnosis present

## 2022-07-12 DIAGNOSIS — I429 Cardiomyopathy, unspecified: Secondary | ICD-10-CM | POA: Diagnosis present

## 2022-07-12 DIAGNOSIS — I4891 Unspecified atrial fibrillation: Secondary | ICD-10-CM

## 2022-07-12 DIAGNOSIS — E039 Hypothyroidism, unspecified: Secondary | ICD-10-CM

## 2022-07-12 DIAGNOSIS — Z006 Encounter for examination for normal comparison and control in clinical research program: Secondary | ICD-10-CM | POA: Diagnosis not present

## 2022-07-12 DIAGNOSIS — I5022 Chronic systolic (congestive) heart failure: Secondary | ICD-10-CM | POA: Diagnosis present

## 2022-07-12 DIAGNOSIS — I420 Dilated cardiomyopathy: Secondary | ICD-10-CM | POA: Diagnosis present

## 2022-07-12 DIAGNOSIS — I110 Hypertensive heart disease with heart failure: Secondary | ICD-10-CM | POA: Diagnosis present

## 2022-07-12 DIAGNOSIS — Z7901 Long term (current) use of anticoagulants: Secondary | ICD-10-CM

## 2022-07-12 DIAGNOSIS — Z791 Long term (current) use of non-steroidal anti-inflammatories (NSAID): Secondary | ICD-10-CM | POA: Diagnosis not present

## 2022-07-12 DIAGNOSIS — I10 Essential (primary) hypertension: Secondary | ICD-10-CM

## 2022-07-12 DIAGNOSIS — Z79890 Hormone replacement therapy (postmenopausal): Secondary | ICD-10-CM | POA: Diagnosis not present

## 2022-07-12 DIAGNOSIS — Z79899 Other long term (current) drug therapy: Secondary | ICD-10-CM

## 2022-07-12 DIAGNOSIS — I4819 Other persistent atrial fibrillation: Principal | ICD-10-CM | POA: Diagnosis present

## 2022-07-12 DIAGNOSIS — G4730 Sleep apnea, unspecified: Secondary | ICD-10-CM

## 2022-07-12 DIAGNOSIS — D6869 Other thrombophilia: Secondary | ICD-10-CM | POA: Diagnosis present

## 2022-07-12 DIAGNOSIS — Z95818 Presence of other cardiac implants and grafts: Secondary | ICD-10-CM

## 2022-07-12 DIAGNOSIS — Z01818 Encounter for other preprocedural examination: Secondary | ICD-10-CM | POA: Diagnosis not present

## 2022-07-12 HISTORY — PX: LEFT ATRIAL APPENDAGE OCCLUSION: EP1229

## 2022-07-12 HISTORY — PX: TEE WITHOUT CARDIOVERSION: SHX5443

## 2022-07-12 HISTORY — DX: Presence of other cardiac implants and grafts: Z95.818

## 2022-07-12 LAB — TYPE AND SCREEN
ABO/RH(D): O NEG
Antibody Screen: NEGATIVE
Weak D: POSITIVE

## 2022-07-12 LAB — POCT ACTIVATED CLOTTING TIME: Activated Clotting Time: 417 seconds

## 2022-07-12 LAB — ABO/RH: ABO/RH(D): O NEG

## 2022-07-12 LAB — ECHO TEE

## 2022-07-12 SURGERY — LEFT ATRIAL APPENDAGE OCCLUSION
Anesthesia: General

## 2022-07-12 MED ORDER — SUGAMMADEX SODIUM 200 MG/2ML IV SOLN
INTRAVENOUS | Status: DC | PRN
  Administered 2022-07-12: 350 mg via INTRAVENOUS

## 2022-07-12 MED ORDER — ACETAMINOPHEN 325 MG PO TABS: 650.0000 mg | ORAL_TABLET | ORAL | Status: DC | PRN

## 2022-07-12 MED ORDER — SODIUM CHLORIDE 0.9% FLUSH: 3.0000 mL | Freq: Two times a day (BID) | INTRAVENOUS | Status: DC

## 2022-07-12 MED ORDER — LIDOCAINE 2% (20 MG/ML) 5 ML SYRINGE
INTRAMUSCULAR | Status: DC | PRN
  Administered 2022-07-12: 80 mg via INTRAVENOUS

## 2022-07-12 MED ORDER — IOHEXOL 350 MG/ML SOLN
INTRAVENOUS | Status: DC | PRN
  Administered 2022-07-12: 20 mL

## 2022-07-12 MED ORDER — ROCURONIUM BROMIDE 10 MG/ML (PF) SYRINGE
PREFILLED_SYRINGE | INTRAVENOUS | Status: DC | PRN
  Administered 2022-07-12: 70 mg via INTRAVENOUS
  Administered 2022-07-12: 50 mg via INTRAVENOUS

## 2022-07-12 MED ORDER — SODIUM CHLORIDE 0.9 % IV SOLN: INTRAVENOUS | Status: DC

## 2022-07-12 MED ORDER — ONDANSETRON HCL 4 MG/2ML IJ SOLN: 4.0000 mg | Freq: Four times a day (QID) | INTRAMUSCULAR | Status: DC | PRN

## 2022-07-12 MED ORDER — HEPARIN (PORCINE) IN NACL 2000-0.9 UNIT/L-% IV SOLN
INTRAVENOUS | Status: DC | PRN
  Administered 2022-07-12: 1000 mL

## 2022-07-12 MED ORDER — HEPARIN SODIUM (PORCINE) 1000 UNIT/ML IJ SOLN
INTRAMUSCULAR | Status: DC | PRN
  Administered 2022-07-12: 13000 [IU] via INTRAVENOUS

## 2022-07-12 MED ORDER — PROPOFOL 10 MG/ML IV BOLUS: INTRAVENOUS | Status: DC | PRN

## 2022-07-12 MED ORDER — CHLORHEXIDINE GLUCONATE 0.12% ORAL RINSE (MEDLINE KIT): 15.0000 mL | OROMUCOSAL | Status: AC

## 2022-07-12 MED ORDER — SODIUM CHLORIDE 0.9% FLUSH: 3.0000 mL | INTRAVENOUS | Status: DC | PRN

## 2022-07-12 MED ORDER — MIDAZOLAM HCL 2 MG/2ML IJ SOLN
INTRAMUSCULAR | Status: DC | PRN
  Administered 2022-07-12: 1 mg via INTRAVENOUS

## 2022-07-12 MED ORDER — HEPARIN (PORCINE) IN NACL 1000-0.9 UT/500ML-% IV SOLN
INTRAVENOUS | Status: DC | PRN
  Administered 2022-07-12: 500 mL

## 2022-07-12 MED ORDER — FENTANYL CITRATE (PF) 100 MCG/2ML IJ SOLN
INTRAMUSCULAR | Status: DC | PRN
  Administered 2022-07-12: 50 ug via INTRAVENOUS

## 2022-07-12 MED ORDER — CHLORHEXIDINE GLUCONATE 0.12 % MT SOLN
OROMUCOSAL | Status: AC
  Administered 2022-07-12: 15 mL via OROMUCOSAL
  Filled 2022-07-12: qty 15

## 2022-07-12 MED ORDER — PROPOFOL 10 MG/ML IV BOLUS
INTRAVENOUS | Status: DC | PRN
  Administered 2022-07-12: 150 mg via INTRAVENOUS

## 2022-07-12 MED ORDER — PROTAMINE SULFATE 10 MG/ML IV SOLN
INTRAVENOUS | Status: DC | PRN
  Administered 2022-07-12: 35 mg via INTRAVENOUS

## 2022-07-12 MED ORDER — CHLORHEXIDINE GLUCONATE 4 % EX LIQD: Freq: Once | CUTANEOUS | Status: DC

## 2022-07-12 MED ORDER — PROPOFOL 500 MG/50ML IV EMUL
INTRAVENOUS | Status: DC | PRN
  Administered 2022-07-12: 125 ug/kg/min via INTRAVENOUS

## 2022-07-12 MED ORDER — SODIUM CHLORIDE 0.9 % IV SOLN: 250.0000 mL | INTRAVENOUS | Status: DC | PRN

## 2022-07-12 MED ORDER — CEFAZOLIN SODIUM-DEXTROSE 2-4 GM/100ML-% IV SOLN
2.0000 g | INTRAVENOUS | Status: AC
  Administered 2022-07-12: 2 g via INTRAVENOUS
  Filled 2022-07-12: qty 100

## 2022-07-12 MED ORDER — ONDANSETRON HCL 4 MG/2ML IJ SOLN
INTRAMUSCULAR | Status: DC | PRN
  Administered 2022-07-12: 4 mg via INTRAVENOUS

## 2022-07-12 MED ORDER — LACTATED RINGERS IV SOLN: INTRAVENOUS | Status: DC

## 2022-07-12 SURGICAL SUPPLY — 19 items
CATH INFINITI 5FR ANG PIGTAIL (CATHETERS) IMPLANT
CLOSURE PERCLOSE PROSTYLE (VASCULAR PRODUCTS) IMPLANT
DEVICE WATCHMAN FLX PRO PROC (KITS) IMPLANT
DEVICE WATCHMAN FXD CRV PROC (KITS) IMPLANT
DILATOR VESSEL 10FR 20CM (INTRODUCER) IMPLANT
KIT HEART LEFT (KITS) ×2 IMPLANT
KIT SHEA VERSACROSS LAAC CONNE (KITS) IMPLANT
PACK CARDIAC CATHETERIZATION (CUSTOM PROCEDURE TRAY) ×2 IMPLANT
PAD DEFIB RADIO PHYSIO CONN (PAD) ×2 IMPLANT
SHEATH PERFORMER 16FR 30 (SHEATH) IMPLANT
SHEATH PINNACLE 8F 10CM (SHEATH) IMPLANT
SHEATH PROBE COVER 6X72 (BAG) ×2 IMPLANT
SYS WATCHMAN FXD DBL (SHEATH) ×1
SYSTEM WATCHMAN FXD DBL (SHEATH) IMPLANT
TRANSDUCER W/STOPCOCK (MISCELLANEOUS) ×2 IMPLANT
TUBING CIL FLEX 10 FLL-RA (TUBING) ×2 IMPLANT
WATCHMAN FLX PRO 31 (Prosthesis & Implant Heart) IMPLANT
WATCHMAN FLX PRO PROCEDURE (KITS) ×1 IMPLANT
WATCHMAN FXD CRV SYS PROCEDURE (KITS) ×1 IMPLANT

## 2022-07-12 NOTE — Discharge Summary (Signed)
[facility]    Patient ID: Shelly Bowers,  MRN: [id number], DOB/AGE: Feb 19, 1951 72 y.o.  Admit date: 07/12/2022 Discharge date: 07/12/2022  Primary Care Physician: Philip Aspen, Limmie Tabria, MD  Primary Cardiologist: Rollene Rotunda, MD  Electrophysiologist: Lanier Prude, MD  Primary Discharge Diagnosis:  Persistent Atrial Fibrillation Poor candidacy for long term anticoagulation due to frequent nosebleeds  Procedures This Admission:  Transeptal Puncture Intra-procedural TEE which showed no LAA thrombus Left atrial appendage occlusive device placement on 07/12/22 by Dr. Lalla Brothers.   Brief HPI: Shelly Bowers is a 72 y.o. female with a history of cardiomyopathy with EF normalization per last echo, HLD, HTN, and atrial fibrillation s/p AF ablation who is now scheduled for LAAO closure 4/18 with Dr. Lalla Brothers. Pre ablation CT with anatomy suitable for implant.   Hospital Course:  The patient was admitted and underwent left atrial appendage occlusive device placement with 33mm Watchman FLX device. She was monitored post procedure and has done very well therefore is being considered for same day discharge. Groin site was without complication. Wound care and restrictions were reviewed with the patient. The patient has been scheduled for post procedure follow up with Georgie Chard, NP in approximately 1 month. She will restart Eliquis later today and remain on this for 45 days at which time she will stop Eliquis and start Plavix. He will continue Plavix through 6 months. A repeat CT will be performed at 60 days to ensure proper device seal with no device thrombus. She will require lifelong dental SBE which will be discussed further at follow up.   Physical Exam: Vitals:   07/12/22 1445 07/12/22 1450 07/12/22 1455 07/12/22 1500  BP: (!) 140/62 138/67 (!) 128/59 (!) 137/59  Pulse: 65 68 64 65  Resp: 16 17 14 15   Temp:   98.3 F (36.8 C)   TempSrc:   Temporal    SpO2: (!) 89% 90% 94% 96%  Weight:      Height:       Labs:   Lab Results  Component Value Date   WBC 4.9 06/25/2022   HGB 11.8 06/25/2022   HCT 36.4 06/25/2022   MCV 103 (H) 06/25/2022   PLT 197 06/25/2022   No results for input(s): "NA", "K", "CL", "CO2", "BUN", "CREATININE", "CALCIUM", "PROT", "BILITOT", "ALKPHOS", "ALT", "AST", "GLUCOSE" in the last 168 hours.  Invalid input(s): "LABALBU"   Discharge Medications:  Allergies as of 07/12/2022       Reactions   Codeine Nausea Only   Hydrocodone Nausea And Vomiting        Medication List     TAKE these medications    atorvastatin 20 MG tablet Commonly known as: LIPITOR Take 1 tablet (20 mg total) by mouth daily.   CALCIUM-VITAMIN D PO Take 1 tablet by mouth daily.   celecoxib 200 MG capsule Commonly known as: CELEBREX TAKE 1 CAPSULE BY MOUTH 2 TIMES DAILY AS NEEDED What changed: See the new instructions.   cetirizine 10 MG tablet Commonly known as: ZYRTEC Take 10 mg by mouth daily.   cholecalciferol 25 MCG (1000 UNIT) tablet Commonly known as: VITAMIN D3 Take 1,000 Units by mouth daily.   cyanocobalamin 1000 MCG tablet Commonly known as: VITAMIN B12 Take 1,000 mcg by mouth daily.   Eliquis 5 MG Tabs tablet Generic drug: apixaban TAKE 1 TABLET BY MOUTH TWICE A DAY   Entresto 24-26 MG Generic drug: sacubitril-valsartan Take 1 tablet by mouth 2 (two) times daily.  fluticasone 50 MCG/ACT nasal spray Commonly known as: FLONASE Place 1-2 sprays into both nostrils daily as needed for allergies or rhinitis.   folic acid 800 MCG tablet Commonly known as: FOLVITE Take 800 mcg by mouth daily.   furosemide 20 MG tablet Commonly known as: LASIX Take 20 mg by mouth daily as needed for fluid.   ibuprofen 200 MG tablet Commonly known as: ADVIL Take 400 mg by mouth every 6 (six) hours as needed for headache, fever or mild pain.   levothyroxine 50 MCG tablet Commonly known as: SYNTHROID TAKE 1 TABLET  BY MOUTH EVERY DAY What changed: when to take this   metoprolol succinate 25 MG 24 hr tablet Commonly known as: TOPROL-XL TAKE 1/2 TABLET BY MOUTH EVERY DAY   metroNIDAZOLE 0.75 % gel Commonly known as: METROGEL Apply 1 application topically at bedtime.   Visine 0.025-0.3 % ophthalmic solution Generic drug: naphazoline-pheniramine Place 1 drop into both eyes as needed for eye irritation.   vitamin E 180 MG (400 UNITS) capsule Take 400 Units by mouth daily.       Disposition:  Home  Discharge Instructions     (HEART FAILURE PATIENTS) Call MD:  Anytime you have any of the following symptoms: 1) 3 pound weight gain in 24 hours or 5 pounds in 1 week 2) shortness of breath, with or without a dry hacking cough 3) swelling in the hands, feet or stomach 4) if you have to sleep on extra pillows at night in order to breathe.   Complete by: As directed    Call MD for:  difficulty breathing, headache or visual disturbances   Complete by: As directed    Call MD for:  extreme fatigue   Complete by: As directed    Call MD for:  hives   Complete by: As directed    Call MD for:  persistant dizziness or light-headedness   Complete by: As directed    Call MD for:  persistant nausea and vomiting   Complete by: As directed    Call MD for:  redness, tenderness, or signs of infection (pain, swelling, redness, odor or green/yellow discharge around incision site)   Complete by: As directed    Call MD for:  severe uncontrolled pain   Complete by: As directed    Call MD for:  temperature >100.4   Complete by: As directed    Diet - low sodium heart healthy   Complete by: As directed    Discharge instructions   Complete by: As directed    [facility] Procedure, Care After  Procedure MD: Dr. Isidoro Donning Clinical Coordinator: Karsten Fells, RN  This sheet gives you information about how to care for yourself after your procedure. Your health care provider may also give you more specific  instructions. If you have problems or questions, contact your health care provider.  What can I expect after the procedure? After the procedure, it is common to have: Bruising around your puncture site. Tenderness around your puncture site. Tiredness (fatigue).  Medication instructions It is very important to continue to take your blood thinner as directed by your doctor after the Watchman procedure. Call your procedure doctor's office with question or concerns. If you are on Coumadin (warfarin), you will have your INR checked the week after your procedure, with a goal INR of 2.0 - 3.0. Please follow your medication instructions on your discharge summary. Only take the medications listed on your discharge paperwork.  Follow up You will be seen  in 1 month after your procedure You will have a repeat CT scan approximately 8 weeks after your procedure mark to check your device You will follow up the MD/APP who performed your procedure 6 months after your procedure The Watchman Clinical Coordinator will check in with you from time to time, including 1 and 2 years after your procedure.    Follow these instructions at home: Puncture site care  Follow instructions from your health care provider about how to take care of your puncture site. Make sure you: If present, leave stitches (sutures), skin glue, or adhesive strips in place.  If a large square bandage is present, this may be removed 24 hours after surgery.  Check your puncture site every day for signs of infection. Check for: Redness, swelling, or pain. Fluid or blood. If your puncture site starts to bleed, lie down on your back, apply firm pressure to the area, and contact your health care provider. Warmth. Pus or a bad smell. Driving Do not drive yourself home if you received sedation Do not drive for at least 4 days after your procedure or however long your health care provider recommends. (Do not resume driving if you have  previously been instructed not to drive for other health reasons.) Do not spend greater than 1 hour at a time in a car for the first 3 days. Stop and take a break with a 5 minute walk at least every hour.  Do not drive or use heavy machinery while taking prescription pain medicine.  Activity Avoid activities that take a lot of effort, including exercise, for at least 7 days after your procedure. For the first 3 days, avoid sitting for longer than one hour at a time.  Avoid alcoholic beverages, signing paperwork, or participating in legal proceedings for 24 hours after receiving sedation Do not lift anything that is heavier than 10 lb (4.5 kg) for one week.  No sexual activity for 1 week.  Return to your normal activities as told by your health care provider. Ask your health care provider what activities are safe for you. General instructions Take over-the-counter and prescription medicines only as told by your health care provider. Do not use any products that contain nicotine or tobacco, such as cigarettes and e-cigarettes. If you need help quitting, ask your health care provider. You may shower after 24 hours, but Do not take baths, swim, or use a hot tub for 1 week.  Do not drink alcohol for 24 hours after your procedure. Keep all follow-up visits as told by your health care provider. This is important. Dental Work: You will require antibiotics prior to any dental work, including cleanings, for 6 months after your Watchman implantation to help protect you from infection. After 6 months, antibiotics are no longer required. Contact a health care provider if: You have redness, mild swelling, or pain around your puncture site. You have soreness in your throat or at your puncture site that does not improve after several days You have fluid or blood coming from your puncture site that stops after applying firm pressure to the area. Your puncture site feels warm to the touch. You have pus or a bad  smell coming from your puncture site. You have a fever. You have chest pain or discomfort that spreads to your neck, jaw, or arm. You are sweating a lot. You feel nauseous. You have a fast or irregular heartbeat. You have shortness of breath. You are dizzy or light-headed and feel the need  to lie down. You have pain or numbness in the arm or leg closest to your puncture site. Get help right away if: Your puncture site suddenly swells. Your puncture site is bleeding and the bleeding does not stop after applying firm pressure to the area. These symptoms may represent a serious problem that is an emergency. Do not wait to see if the symptoms will go away. Get medical help right away. Call your local emergency services (911 in the [place] Do not drive yourself to the hospital. Summary After the procedure, it is normal to have bruising and tenderness at the puncture site in your groin, neck, or forearm. Check your puncture site every day for signs of infection. Get help right away if your puncture site is bleeding and the bleeding does not stop after applying firm pressure to the area. This is a medical emergency.  This information is not intended to replace advice given to you by your health care provider. Make sure you discuss any questions you have with your health care provider.   Increase activity slowly   Complete by: As directed        Follow-up Information     Filbert Schilder, NP Follow up on 08/15/2022.   Specialty: Cardiology Why: @ 930am. Please arrive at 9:15am. Contact information: [address] [place] [address] [phone]                 Duration of Discharge Encounter: Greater than 30 minutes including physician time.  Signed, Georgie Chard, NP  07/12/2022 4:27 PM

## 2022-07-12 NOTE — TOC Transition Note (Signed)
Transition of Care [facility]) - CM/SW Discharge Note   Patient Details  Name: Shelly Bowers MRN: [id number] Date of Birth: [birth year]  [facility] CM/SW Contact:  Leone Haven, RN Phone Number: 07/12/2022, 4:46 PM   Clinical Narrative:    Patient is for dc today, has no needs. Spouse will transport her home.   Final next level of care: Home/Self Care     Patient Goals and CMS Choice      Discharge Placement                         Discharge Plan and Services Additional resources added to the After Visit Summary for                                       Social Determinants of Health (SDOH) Interventions SDOH Screenings   Food Insecurity: No Food Insecurity (05/08/2022)  Housing: Low Risk  (05/08/2022)  Depression (PHQ2-9): Low Risk  (05/08/2022)  Tobacco Use: Low Risk  (07/12/2022)     Readmission Risk Interventions     No data to display

## 2022-07-12 NOTE — Anesthesia Procedure Notes (Signed)
Procedure Name: Intubation Date/Time: 07/12/2022 1:02 PM  Performed by: Samara Deist, CRNAPre-anesthesia Checklist: Patient identified, Emergency Drugs available, Suction available and Patient being monitored Patient Re-evaluated:Patient Re-evaluated prior to induction Oxygen Delivery Method: Circle System Utilized Preoxygenation: Pre-oxygenation with 100% oxygen Induction Type: IV induction Ventilation: Mask ventilation without difficulty Laryngoscope Size: Glidescope and 3 Grade View: Grade I Tube type: Oral Tube size: 7.0 mm Number of attempts: 1 Airway Equipment and Method: Stylet Placement Confirmation: ETT inserted through vocal cords under direct vision, positive ETCO2 and breath sounds checked- equal and bilateral Secured at: 21 cm Tube secured with: Tape Dental Injury: Teeth and Oropharynx as per pre-operative assessment

## 2022-07-12 NOTE — Transfer of Care (Signed)
Immediate Anesthesia Transfer of Care Note  Patient: Shelly Bowers  Procedure(s) Performed: LEFT ATRIAL APPENDAGE OCCLUSION TRANSESOPHAGEAL ECHOCARDIOGRAM  Patient Location: PACU  Anesthesia Type:General  Level of Consciousness: awake, alert , and oriented  Airway & Oxygen Therapy: Patient Spontanous Breathing and Patient connected to nasal cannula oxygen  Post-op Assessment: Report given to RN and Post -op Vital signs reviewed and stable  Post vital signs: Reviewed and stable  Last Vitals:  Vitals Value Taken Time  BP 143/58 07/12/22 1440  Temp    Pulse 70 07/12/22 1443  Resp 14 07/12/22 1443  SpO2 88 % 07/12/22 1443  Vitals shown include unvalidated device data.  Last Pain:  Vitals:   07/12/22 1151  PainSc: 0-No pain      Patients Stated Pain Goal: 0 (07/12/22 1151)  Complications: There were no known notable events for this encounter.

## 2022-07-12 NOTE — H&P (Signed)
Electrophysiology Office Follow up Visit Note:     Date:  07/12/2022    ID:  Shelly Bowers, DOB 08/06/1950, MRN [id number]   PCP:  Philip Aspen, Limmie Maryon, MD         [facility] Cardiologist:  Rollene Rotunda, MD  [facility] Electrophysiologist:  Lanier Prude, MD      Interval History:     Shelly Bowers is a 72 y.o. female who presents for a follow up visit. They were last seen in clinic 09/13/2021 for persistent AF. At that appointment, her amoidarone was stopped. She takes Eliquis for stroke ppx.   Today, she is feeling overall well. She confirms that she has stopped Amiodarone and continues to be compliant with Entresto, Eliquis, and Celebrex.    She reports that she tends to have blood on the tissue when blowing her nose in the mornings. She has also had nosebleeds in the past. She attributes this to her taking blood thinners.  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  She also uses ibuprofen occasionally.  She is interested in avoiding long-term exposure anticoagulation given the associated bleeding risk.   She denies any palpitations, chest pain, shortness of breath, or peripheral edema. No lightheadedness, headaches, syncope, orthopnea, or PND.  Presents for LAAO today. Procedure reviewed.        Past Medical History:  Diagnosis Date   Atrial fibrillation (HCC)     Cardiomyopathy (HCC)     HTN (hypertension)     Hyperlipidemia     Osteopenia             Past Surgical History:  Procedure Laterality Date   ATRIAL FIBRILLATION ABLATION N/A 11/21/2020    Procedure: ATRIAL FIBRILLATION ABLATION;  Surgeon: Lanier Prude, MD;  Location: [facility];  Service: Cardiovascular;  Laterality: N/A;   AUGMENTATION MAMMAPLASTY Bilateral     BREAST EXCISIONAL BIOPSY Left     BUBBLE STUDY   08/25/2020    Procedure: BUBBLE STUDY;  Surgeon: Christell Constant, MD;  Location: [facility];  Service:  Cardiovascular;;   CARDIOVERSION N/A 08/25/2020    Procedure: CARDIOVERSION;  Surgeon: Christell Constant, MD;  Location: [facility];  Service: Cardiovascular;  Laterality: N/A;   CARDIOVERSION N/A 12/26/2020    Procedure: CARDIOVERSION;  Surgeon: Christell Constant, MD;  Location: [facility];  Service: Cardiovascular;  Laterality: N/A;   INCISION AND DRAINAGE ABSCESS Left 12/27/2015    Procedure: INCISION AND DRAINAGE ABSCESS;  Surgeon: Cindee Salt, MD;  Location: [facility];  Service: Orthopedics;  Laterality: Left;   TEE WITHOUT CARDIOVERSION N/A 08/25/2020    Procedure: TRANSESOPHAGEAL ECHOCARDIOGRAM (TEE);  Surgeon: Christell Constant, MD;  Location: [facility] ENDOSCOPY;  Service: Cardiovascular;  Laterality: N/A;      Current Medications: Active Medications      Current Meds  Medication Sig   atorvastatin (LIPITOR) 10 MG tablet TAKE ONE TABLET BY MOUTH ONE TIME DAILY **NEED APPT FOR FURTHER REFILLS**   CALCIUM-VITAMIN D PO Take 1 tablet by mouth daily.   celecoxib (CELEBREX) 200 MG capsule TAKE 1 CAPSULE BY MOUTH 2 TIMES DAILY AS NEEDED   cetirizine (ZYRTEC) 10 MG tablet Take 10 mg by mouth daily.   cholecalciferol (VITAMIN D3) 25 MCG (1000 UNIT) tablet Take 1,000 Units by mouth daily.   Cyanocobalamin (B-12 PO) Take 1,000 mcg by mouth daily.   ELIQUIS 5 MG TABS tablet TAKE 1 TABLET BY MOUTH TWICE A  DAY   folic acid (FOLVITE) 800 MCG tablet Take 800 mcg by mouth daily.   hydrochlorothiazide (HYDRODIURIL) 25 MG tablet as needed.   ibuprofen (ADVIL,MOTRIN) 200 MG tablet Take 400 mg by mouth every 6 (six) hours as needed for headache, fever or mild pain.   levothyroxine (SYNTHROID) 50 MCG tablet TAKE 1 TABLET BY MOUTH EVERY DAY   metoprolol succinate (TOPROL XL) 25 MG 24 hr tablet Take 0.5 tablets (12.5 mg total) by mouth daily.   metroNIDAZOLE (METROGEL) 0.75 % gel Apply 1 application topically at bedtime.   naphazoline-pheniramine (VISINE) 0.025-0.3 % ophthalmic  solution Place 1 drop into both eyes daily as needed for eye irritation.   sacubitril-valsartan (ENTRESTO) 24-26 MG Take 1 tablet by mouth 2 (two) times daily.   triamcinolone (NASACORT) 55 MCG/ACT AERO nasal inhaler Place 2 sprays into the nose 2 (two) times daily as needed (Congestion).   Vitamin D, Ergocalciferol, (DRISDOL) 1.25 MG (50000 UNIT) CAPS capsule Take 1 capsule (50,000 Units total) by mouth every 7 (seven) days for 12 doses.   vitamin E 1000 UNIT capsule Take 1,000 Units by mouth daily.        Allergies:   Codeine and Hydrocodone    Social History         Socioeconomic History   Marital status: Married      Spouse name: Not on file   Number of children: Not on file   Years of education: Not on file   Highest education level: Not on file  Occupational History   Not on file  Tobacco Use   Smoking status: Never   Smokeless tobacco: Never  Substance and Sexual Activity   Alcohol use: Yes      Alcohol/week: 14.0 standard drinks of alcohol      Types: 14 Glasses of wine per week      Comment: 1 glass of wine nightly   Drug use: No   Sexual activity: Not on file  Other Topics Concern   Not on file  Social History Narrative    Left handed    Caffeine use: tea sometimes    Soda-zero sugar/no caffeine ginger ale    1-2 cups coffee per day (decaf)    Social Determinants of Health    Financial Resource Strain: Not on file  Food Insecurity: Not on file  Transportation Needs: Not on file  Physical Activity: Not on file  Stress: Not on file  Social Connections: Not on file      Family History: The patient's family history includes Atrial fibrillation in her father; Breast cancer (age of onset: 74) in her mother.   ROS:   Please see the history of present illness.    (+) nose bleeds All other systems reviewed and are negative.   EKGs/Labs/Other Studies Reviewed:     The following studies were reviewed today:     EKG:  The ekg ordered today demonstrates  sinus rhythm.   Recent Labs: 04/11/2022: ALT 12; BUN 13; Creatinine, Ser 0.88; Hemoglobin 12.6; Platelets 187.0; Potassium 4.6; Sodium 139; TSH 1.96  Recent Lipid Panel Labs (Brief)          Component Value Date/Time    CHOL 218 (H) 04/11/2022 1059    TRIG 120.0 04/11/2022 1059    HDL 68.00 04/11/2022 1059    CHOLHDL 3 04/11/2022 1059    VLDL 24.0 04/11/2022 1059    LDLCALC 126 (H) 04/11/2022 1059    LDLCALC 125 (H) 01/22/2020 1115    LDLDIRECT  191.9 04/14/2013 0856        Physical Exam:     VS:  BP 154/64   Pulse 61   Ht 5' 5.5" (1.664 m)   Wt 190 lb 6.4 oz (86.4 kg)   SpO2 98%   BMI 31.20 kg/m         Wt Readings from Last 3 Encounters:  05/04/22 190 lb 6.4 oz (86.4 kg)  04/11/22 189 lb 4.8 oz (85.9 kg)  02/07/22 190 lb 6.4 oz (86.4 kg)      GEN:  Well nourished, well developed in no acute distress CARDIAC: RRR, no murmurs, rubs, gallops           Assessment ASSESSMENT:     1. Persistent atrial fibrillation (HCC)   2. Chronic systolic congestive heart failure (HCC)     PLAN:     In order of problems listed above:     #Persistent AF Maintaining sinus rhythm after ablation in 10/2020. Off amiodarone. Continue eliquis.     ----------------------   I have seen Lovenia Shuck in the office today who is being considered for a Watchman left atrial appendage closure device. I believe they will benefit from this procedure given their history of atrial fibrillation, CHA2DS2-VASc score of 4 and unadjusted ischemic stroke rate of 4.8% per year. Unfortunately, the patient is not felt to be a long term anticoagulation candidate secondary to chronic NSAID use and intermittent nose bleeds. The patient's chart has been reviewed and I feel that they would be a candidate for short term oral anticoagulation after Watchman implant.    It is my belief that after undergoing a LAA closure procedure, IVERNA HAMMAC will not need long term anticoagulation which  eliminates anticoagulation side effects and major bleeding risk.    Procedural risks for the Watchman implant have been reviewed with the patient including a 0.5% risk of stroke, <1% risk of perforation and <1% risk of device embolization. Other risks include bleeding, vascular damage, tamponade, worsening renal function, and death. The patient understands these risk and wishes to proceed.       The published clinical data on the safety and effectiveness of WATCHMAN include but are not limited to the following: - Holmes DR, Everlene Farrier, Sick P et al. for the PROTECT AF Investigators. Percutaneous closure of the left atrial appendage versus warfarin therapy for prevention of stroke in patients with atrial fibrillation: a randomised non-inferiority trial. Lancet 2009; 374: 534-42. Everlene Farrier, Doshi SK, Isa Rankin D et al. on behalf of the PROTECT AF Investigators. Percutaneous Left Atrial Appendage Closure for Stroke Prophylaxis in Patients With Atrial Fibrillation 2.3-Year Follow-up of the PROTECT AF (Watchman Left Atrial Appendage System for Embolic Protection in Patients With Atrial Fibrillation) Trial. Circulation 2013; 127:720-729. - Alli O, Doshi S,  Kar S, Reddy VY, Sievert H et al. Quality of Life Assessment in the Randomized PROTECT AF (Percutaneous Closure of the Left Atrial Appendage Versus Warfarin Therapy for Prevention of Stroke in Patients With Atrial Fibrillation) Trial of Patients at Risk for Stroke With Nonvalvular Atrial Fibrillation. J Am Coll Cardiol 2013; 61:1790-8. Aline August DR, Mia [place], Price M, Whisenant B, Sievert H, Doshi S, Huber K, Reddy V. Prospective randomized evaluation of the Watchman left atrial appendage Device in patients with atrial fibrillation versus long-term warfarin therapy; the PREVAIL trial. Journal of the [employer] of Cardiology, Vol. 4, No. 1, 2014, 1-11. - Kar S, Doshi SK, Sadhu A, Horton R, Osorio J et al.  Primary outcome evaluation of a  next-generation left atrial appendage closure device: results from the PINNACLE FLX trial. Circulation 2021;143(18)1754-1762.      After today's visit with the patient which was dedicated solely for shared decision making visit regarding LAA closure device, the patient decided to proceed with the LAA appendage closure procedure scheduled to be done in the near future at [facility].   She will not need a CT scan prior to the procedure.  Will use her preablation CT scan for measurements.   HAS-BLED score 3 Hypertension Yes  Abnormal renal and liver function (Dialysis, transplant, Cr >2.26 mg/dL /Cirrhosis or Bilirubin >2x Normal or AST/ALT/AP >3x Normal) No  Stroke No  Bleeding No  Labile INR (Unstable/high INR) No  Elderly (>65) Yes  Drugs or alcohol (? 8 drinks/week, anti-plt or NSAID) Yes    CHA2DS2-VASc Score = 4  The patient's score is based upon: CHF History: 1 HTN History: 1 Diabetes History: 0 Stroke History: 0 Vascular Disease History: 0 Age Score: 1 Gender Score: 1   #HFrEF NYHA class I-II. Warm and dry on exam. Continue current medical therapy.   Follow up with APP in 1 year.        Presents for LAAO today. Procedure reviewed.   Signed, Steffanie Dunn, MD, [facility], [facility] 07/12/2022 Electrophysiology [facility]

## 2022-07-12 NOTE — Anesthesia Preprocedure Evaluation (Signed)
Anesthesia Evaluation  Patient identified by MRN, date of birth, ID band Patient awake    Reviewed: Allergy & Precautions, NPO status , Patient's Chart, lab work & pertinent test results  Airway Mallampati: II  TM Distance: >3 FB Neck ROM: Full    Dental no notable dental hx.    Pulmonary sleep apnea    Pulmonary exam normal breath sounds clear to auscultation       Cardiovascular hypertension, Pt. on medications Normal cardiovascular exam+ dysrhythmias Atrial Fibrillation  Rhythm:Irregular Rate:Normal     Neuro/Psych  Neuromuscular disease    GI/Hepatic negative GI ROS, Neg liver ROS,,,  Endo/Other  Hypothyroidism    Renal/GU negative Renal ROS     Musculoskeletal   Abdominal   Peds  Hematology   Anesthesia Other Findings   Reproductive/Obstetrics                             Anesthesia Physical Anesthesia Plan  ASA: 3  Anesthesia Plan: General   Post-op Pain Management: Minimal or no pain anticipated   Induction: Intravenous  PONV Risk Score and Plan: 3 and Treatment may vary due to age or medical condition, Ondansetron, Dexamethasone and Midazolam  Airway Management Planned: Oral ETT  Additional Equipment: ClearSight  Intra-op Plan:   Post-operative Plan:   Informed Consent: I have reviewed the patients History and Physical, chart, labs and discussed the procedure including the risks, benefits and alternatives for the proposed anesthesia with the patient or authorized representative who has indicated his/her understanding and acceptance.     Dental advisory given  Plan Discussed with: CRNA and Anesthesiologist  Anesthesia Plan Comments:         Anesthesia Quick Evaluation

## 2022-07-13 ENCOUNTER — Telehealth: Payer: Self-pay | Admitting: Cardiology

## 2022-07-13 ENCOUNTER — Encounter (HOSPITAL_COMMUNITY): Payer: Self-pay | Admitting: Cardiology

## 2022-07-13 ENCOUNTER — Telehealth: Payer: Self-pay

## 2022-07-13 NOTE — Anesthesia Postprocedure Evaluation (Signed)
Anesthesia Post Note  Patient: Shelly Bowers  Procedure(s) Performed: LEFT ATRIAL APPENDAGE OCCLUSION TRANSESOPHAGEAL ECHOCARDIOGRAM     Patient location during evaluation: PACU Anesthesia Type: General Level of consciousness: awake and alert Pain management: pain level controlled Vital Signs Assessment: post-procedure vital signs reviewed and stable Respiratory status: spontaneous breathing, nonlabored ventilation and respiratory function stable Cardiovascular status: blood pressure returned to baseline and stable Postop Assessment: no apparent nausea or vomiting Anesthetic complications: no   There were no known notable events for this encounter.  Last Vitals:  Vitals:   07/12/22 1500 07/12/22 1632  BP: (!) 137/59 (!) 148/85  Pulse: 65 60  Resp: 15 15  Temp:  36.6 C  SpO2: 96% 99%    Last Pain:  Vitals:   07/12/22 1632  TempSrc: Oral  PainSc:                  Lowella Curb

## 2022-07-13 NOTE — Telephone Encounter (Signed)
  HEART AND VASCULAR CENTER   MULTIDISCIPLINARY HEART VALVE TEAM   Patient contacted regarding discharge from [facility] on 07/12/22   Patient understands to follow up with provider Georgie Chard  Patient understands discharge instructions? Yes  Patient understands medications and regimen? Yes  Patient understands to bring all medications to this visit? Yes   Georgie Chard NP-C Structural Heart Team  Pager: [phone]

## 2022-07-13 NOTE — Transitions of Care (Post Inpatient/ED Visit) (Signed)
   07/13/2022  Name: [name] MRN: [id number] DOB: [birth year]  Today's TOC FU Call Status: Today's TOC FU Call Status:: Unsuccessul Call (1st Attempt) Unsuccessful Call (1st Attempt) Date: 07/13/22  Attempted to reach the patient regarding the most recent Inpatient/ED visit.  Follow Up Plan: Additional outreach attempts will be made to reach the patient to complete the Transitions of Care (Post Inpatient/ED visit) call.     Antionette Fairy, RN,BSN,CCM [facility] Care Management Care Management Community Coordinator Direct Phone: [phone] Toll Free: [phone] Fax: [phone]

## 2022-07-13 NOTE — Telephone Encounter (Signed)
  HEART AND VASCULAR CENTER   MULTIDISCIPLINARY HEART VALVE TEAM   Patient contacted regarding discharge from [facility] on 07/12/22   Patient understands to follow up with provider Georgie Chard on 08/15/22  Patient understands discharge instructions? Yes  Patient understands medications and regimen? Yes  Patient understands to bring all medications to this visit? Yes   Georgie Chard NP-C Structural Heart Team  Pager: [phone]

## 2022-07-13 NOTE — Transitions of Care (Post Inpatient/ED Visit) (Signed)
   07/13/2022  Name: LIZETTE PAZOS MRN: [id number] DOB: March 31, 1950  Today's TOC FU Call Status: Today's TOC FU Call Status:: Successful TOC FU Call Competed TOC FU Call Complete Date: 07/13/22 (incoming call from pt returning RN CM call.)  Transition Care Management Follow-up Telephone Call Date of Discharge: 07/12/22 Discharge Facility: [facility]) Type of Discharge: Inpatient Admission Primary Inpatient Discharge Diagnosis:: "presence of watchman left atrial appendage closure device" How have you been since you were released from the hospital?: Better (pt states she is doing good. She removed drsg today-area looks good-no s/s of infection. Her throat is "a little sore from tube placement" but she has been able to eat and drink. LBM was this am.) Any questions or concerns?: No  Items Reviewed: Did you receive and understand the discharge instructions provided?: Yes Medications obtained and verified?: Yes (Medications Reviewed) Any new allergies since your discharge?: No Dietary orders reviewed?: Yes Type of Diet Ordered:: low salt/heart healthy Do you have support at home?: Yes People in Home: spouse Name of Support/Comfort Primary Source: [facility] and Equipment/Supplies: Were Home Health Services Ordered?: NA Any new equipment or medical supplies ordered?: NA  Functional Questionnaire: Do you need assistance with bathing/showering or dressing?: No Do you need assistance with meal preparation?: No Do you need assistance with eating?: No Do you have difficulty maintaining continence: No Do you need assistance with getting out of bed/getting out of a chair/moving?: No Do you have difficulty managing or taking your medications?: No  Follow up appointments reviewed: PCP Follow-up appointment confirmed?: NA Specialist Hospital Follow-up appointment confirmed?: Yes Date of Specialist follow-up appointment?: 08/15/22 Follow-Up Specialty Provider:: Noreene Larsson  McDaniel,NP Do you need transportation to your follow-up appointment?: No Do you understand care options if your condition(s) worsen?: Yes-patient verbalized understanding   TOC Interventions Today    Flowsheet Row Most Recent Value  TOC Interventions   TOC Interventions Discussed/Reviewed TOC Interventions Discussed, Post discharge activity limitations per provider, S/S of infection, Post op wound/incision care      Interventions Today    Flowsheet Row Most Recent Value  General Interventions   General Interventions Discussed/Reviewed General Interventions Discussed, Doctor Visits  Doctor Visits Discussed/Reviewed Specialist, Doctor Visits Discussed  PCP/Specialist Visits Compliance with follow-up visit  Education Interventions   Education Provided Provided Education  Provided Verbal Education On Nutrition, When to see the doctor  Nutrition Interventions   Nutrition Discussed/Reviewed Nutrition Discussed  Pharmacy Interventions   Pharmacy Dicussed/Reviewed Pharmacy Topics Discussed        Alessandra Grout [facility] Care Management Care Management Community Coordinator Direct Phone: [phone] Toll Free: [phone] Fax: [phone]

## 2022-07-13 NOTE — Telephone Encounter (Signed)
Error in previous encounter for [facility] call post Watchman. Attempted to reach the patient with no answer. LVM to return call.   Georgie Chard NP-C Structural Heart Team  Pager: [phone] Phone: [phone]

## 2022-07-16 ENCOUNTER — Telehealth: Payer: Self-pay | Admitting: Cardiology

## 2022-07-16 NOTE — Telephone Encounter (Signed)
  HEART AND VASCULAR CENTER   MULTIDISCIPLINARY HEART VALVE TEAM   Patient contacted regarding discharge from [facility] on 4/18   Patient understands to follow up with provider Georgie Chard, NP-C Patient understands discharge instructions? Yes  Patient understands medications and regimen? Yes  Patient understands to bring all medications to this visit? Yes   Georgie Chard NP-C Structural Heart Team  Pager: [phone]

## 2022-07-17 ENCOUNTER — Other Ambulatory Visit (INDEPENDENT_AMBULATORY_CARE_PROVIDER_SITE_OTHER): Payer: Medicare HMO

## 2022-07-17 DIAGNOSIS — E559 Vitamin D deficiency, unspecified: Secondary | ICD-10-CM | POA: Diagnosis not present

## 2022-07-17 DIAGNOSIS — E782 Mixed hyperlipidemia: Secondary | ICD-10-CM

## 2022-07-17 DIAGNOSIS — G4733 Obstructive sleep apnea (adult) (pediatric): Secondary | ICD-10-CM | POA: Diagnosis not present

## 2022-07-17 LAB — LIPID PANEL
Cholesterol: 166 mg/dL (ref 0–200)
HDL: 53.1 mg/dL (ref >39.00)
LDL Cholesterol: 90 mg/dL (ref 0–99)
NonHDL: 112.99
Total CHOL/HDL Ratio: 3
Triglycerides: 115 mg/dL (ref 0.0–149.0)
VLDL: 23 mg/dL (ref 0.0–40.0)

## 2022-07-17 LAB — VITAMIN D 25 HYDROXY (VIT D DEFICIENCY, FRACTURES): VITD: 35.43 ng/mL (ref 30.00–100.00)

## 2022-07-18 ENCOUNTER — Other Ambulatory Visit: Payer: Self-pay | Admitting: Internal Medicine

## 2022-07-18 DIAGNOSIS — E559 Vitamin D deficiency, unspecified: Secondary | ICD-10-CM

## 2022-07-18 MED ORDER — VITAMIN D (ERGOCALCIFEROL) 1.25 MG (50000 UNIT) PO CAPS
50000.0000 [IU] | ORAL_CAPSULE | ORAL | 0 refills | Status: AC
Start: 2022-07-18 — End: 2022-10-04

## 2022-08-09 ENCOUNTER — Telehealth: Payer: Self-pay

## 2022-08-09 NOTE — Progress Notes (Signed)
[facility]                                     Cardiology Office Note:    Date:  08/15/2022   ID:  Shelly Bowers, DOB July 13, 1950, MRN [id number]  PCP:  Philip Aspen, Limmie Katelinn, MD  [facility] Cardiologist:  Rollene Rotunda, MD  [facility] Electrophysiologist:  Lanier Prude, MD   Referring MD: Philip Aspen, Estel*   Chief Complaint  Patient presents with   Follow-up    S/p LAAO    History of Present Illness:    Shelly Bowers is a 72 y.o. female with a hx of cardiomyopathy with EF normalization per last echo, HLD, HTN, and atrial fibrillation s/p AF ablation who is now s/p LAAO closure 4/18 with Dr. Lalla Brothers.   Shelly Bowers was admitted and underwent left atrial appendage occlusive device placement with 31mm Watchman FLX device. She tolerated the procedure well and was discharged same day home. She was restarted on Eliquis with plan to continue for 45 days then transition to Plavix for a total duration of 6 months.   She is here today alone and reports that she has done well since her procedure. She denies SOB, chest pain, palpitations, LE edema, orthopnea, bleeding in stool or urine, dizziness, or syncope.   Past Medical History:  Diagnosis Date   Atrial fibrillation (HCC)    Cardiomyopathy (HCC)    HTN (hypertension)    Hyperlipidemia    Osteopenia    Presence of Watchman left atrial appendage closure device 07/12/2022   31mm Watchman FLX with Dr. Lalla Brothers    Past Surgical History:  Procedure Laterality Date   ATRIAL FIBRILLATION ABLATION N/A 11/21/2020   Procedure: ATRIAL FIBRILLATION ABLATION;  Surgeon: Lanier Prude, MD;  Location: [facility];  Service: Cardiovascular;  Laterality: N/A;   AUGMENTATION MAMMAPLASTY Bilateral    BREAST EXCISIONAL BIOPSY Left    BUBBLE STUDY  08/25/2020   Procedure: BUBBLE STUDY;  Surgeon: Christell Constant, MD;  Location: [facility];  Service: Cardiovascular;;   CARDIOVERSION  N/A 08/25/2020   Procedure: CARDIOVERSION;  Surgeon: Christell Constant, MD;  Location: [facility];  Service: Cardiovascular;  Laterality: N/A;   CARDIOVERSION N/A 12/26/2020   Procedure: CARDIOVERSION;  Surgeon: Christell Constant, MD;  Location: [facility];  Service: Cardiovascular;  Laterality: N/A;   INCISION AND DRAINAGE ABSCESS Left 12/27/2015   Procedure: INCISION AND DRAINAGE ABSCESS;  Surgeon: Cindee Salt, MD;  Location: [facility];  Service: Orthopedics;  Laterality: Left;   LEFT ATRIAL APPENDAGE OCCLUSION N/A 07/12/2022   Procedure: LEFT ATRIAL APPENDAGE OCCLUSION;  Surgeon: Lanier Prude, MD;  Location: [facility];  Service: Cardiovascular;  Laterality: N/A;   TEE WITHOUT CARDIOVERSION N/A 08/25/2020   Procedure: TRANSESOPHAGEAL ECHOCARDIOGRAM (TEE);  Surgeon: Christell Constant, MD;  [facility];  Service: Cardiovascular;  Laterality: N/A;   TEE WITHOUT CARDIOVERSION N/A 07/12/2022   Procedure: TRANSESOPHAGEAL ECHOCARDIOGRAM;  Surgeon: Lanier Prude, MD;  Location: [facility] INVASIVE CV LAB;  Service: Cardiovascular;  Laterality: N/A;    Current Medications: Current Meds  Medication Sig   amoxicillin (AMOXIL) 500 MG tablet Take 4 tablets (2,000 mg total) by mouth as directed. 1 HOUR PRIOR TO DENTAL APPOINTMENTS   atorvastatin (LIPITOR) 20 MG tablet Take 1 tablet (20 mg total) by mouth daily.   CALCIUM-VITAMIN D PO  Take 1 tablet by mouth daily.   celecoxib (CELEBREX) 200 MG capsule TAKE 1 CAPSULE BY MOUTH 2 TIMES DAILY AS NEEDED (Patient taking differently: Take 200 mg by mouth 2 (two) times daily as needed for moderate pain.)   cholecalciferol (VITAMIN D3) 25 MCG (1000 UNIT) tablet Take 1,000 Units by mouth daily.   clopidogrel (PLAVIX) 75 MG tablet Take 1 tablet (75 mg total) by mouth daily. START ON 6/3   cyanocobalamin (VITAMIN B12) 1000 MCG tablet Take 1,000 mcg by mouth daily.   fluticasone (FLONASE) 50 MCG/ACT nasal spray Place  1-2 sprays into both nostrils daily as needed for allergies or rhinitis.   folic acid (FOLVITE) 800 MCG tablet Take 800 mcg by mouth daily.   furosemide (LASIX) 20 MG tablet Take 20 mg by mouth daily as needed for fluid.   ibuprofen (ADVIL,MOTRIN) 200 MG tablet Take 400 mg by mouth every 6 (six) hours as needed for headache, fever or mild pain.   levothyroxine (SYNTHROID) 50 MCG tablet TAKE 1 TABLET BY MOUTH EVERY DAY (Patient taking differently: Take 50 mcg by mouth daily before breakfast.)   metoprolol succinate (TOPROL-XL) 25 MG 24 hr tablet TAKE 1/2 TABLET BY MOUTH EVERY DAY   metroNIDAZOLE (METROGEL) 0.75 % gel Apply 1 application topically at bedtime.   naphazoline-pheniramine (VISINE) 0.025-0.3 % ophthalmic solution Place 1 drop into both eyes as needed for eye irritation.   sacubitril-valsartan (ENTRESTO) 24-26 MG Take 1 tablet by mouth 2 (two) times daily.   Vitamin D, Ergocalciferol, (DRISDOL) 1.25 MG (50000 UNIT) CAPS capsule Take 1 capsule (50,000 Units total) by mouth every 7 (seven) days for 12 doses.   vitamin E 180 MG (400 UNITS) capsule Take 400 Units by mouth daily.   [DISCONTINUED] apixaban (ELIQUIS) 5 MG TABS tablet TAKE 1 TABLET BY MOUTH TWICE A DAY     Allergies:   Codeine and Hydrocodone   Social History   Socioeconomic History   Marital status: Married    Spouse name: Not on file   Number of children: Not on file   Years of education: Not on file   Highest education level: Not on file  Occupational History   Not on file  Tobacco Use   Smoking status: Never   Smokeless tobacco: Never  Substance and Sexual Activity   Alcohol use: Yes    Alcohol/week: 14.0 standard drinks of alcohol    Types: 14 Glasses of wine per week    Comment: 1 glass of wine nightly   Drug use: No   Sexual activity: Not on file  Other Topics Concern   Not on file  Social History Narrative   Left handed   Caffeine use: tea sometimes   Soda-zero sugar/no caffeine ginger ale   1-2  cups coffee per day (decaf)   Social Determinants of Health   Financial Resource Strain: Not on file  Food Insecurity: No Food Insecurity (08/14/2022)   Hunger Vital Sign    Worried About Running Out of Food in the Last Year: Never true    Ran Out of Food in the Last Year: Never true  Transportation Needs: Not on file  Physical Activity: Not on file  Stress: Not on file  Social Connections: Not on file     Family History: The patient's family history includes Atrial fibrillation in her father; Breast cancer (age of onset: 6) in her mother.  ROS:   Please see the history of present illness.    All other systems reviewed and  are negative.  EKGs/Labs/Other Studies Reviewed:    The following studies were reviewed today:  Watchman 07/12/22:  Procedures This Admission:  Transeptal Puncture Intra-procedural TEE which showed no LAA thrombus Left atrial appendage occlusive device placement on 07/12/22 by Dr. Lalla Brothers.   EKG:  EKG is not ordered today.    Recent Labs: 04/11/2022: ALT 12; TSH 1.96 06/25/2022: BUN 14; Creatinine, Ser 0.82; Hemoglobin 11.8; Platelets 197; Potassium 5.2; Sodium 141   Recent Lipid Panel    Component Value Date/Time   CHOL 166 07/17/2022 1004   TRIG 115.0 07/17/2022 1004   HDL 53.10 07/17/2022 1004   CHOLHDL 3 07/17/2022 1004   VLDL 23.0 07/17/2022 1004   LDLCALC 90 07/17/2022 1004   LDLCALC 125 (H) 01/22/2020 1115   LDLDIRECT 191.9 04/14/2013 0856   Physical Exam:    VS:  BP 126/74   Pulse (!) 49   Ht 5\' 5"  (1.651 m)   Wt 193 lb 3.2 oz (87.6 kg)   SpO2 97%   BMI 32.15 kg/m     Wt Readings from Last 3 Encounters:  08/15/22 193 lb 3.2 oz (87.6 kg)  07/12/22 189 lb (85.7 kg)  06/25/22 193 lb 6.4 oz (87.7 kg)    General: Well developed, well nourished, NAD Lungs:Clear to ausculation bilaterally. No wheezes, rales, or rhonchi. Breathing is unlabored. Cardiovascular: RRR with S1 S2. No murmurs, rubs, gallops, or LV heave  appreciated. Extremities: No edema.  Neuro: Alert and oriented. No focal deficits. No facial asymmetry. MAE spontaneously. Psych: Responds to questions appropriately with normal affect.    ASSESSMENT/PLAN:    Persistent atrial fibrillation: s/p AF ablation and is now s/p LAAO closure with 31mm Watchman FLX device on 07/12/22. She was restarted on Eliquis for 45 days and will continue through 08/26/22. She will then stop and start Plavix 75mg  QD on 08/27/22 and continue through 01/11/23. She will need dental SBE with Amoxicillin until 01/11/23. This was discussed and sent to preferred pharmacy. Post Watchman CT scan instructions review with all questions answer. Obtain BMET today. Plan 6 month follow up with our team.    HFrEF/NICM: Patient doing well with NYHA class I symptoms. EF normalized on last echocardiogram. Continue current regimen.    HTN: Stable with no changes needed at this time.   Medication Adjustments/Labs and Tests Ordered: Current medicines are reviewed at length with the patient today.  Concerns regarding medicines are outlined above.  No orders of the defined types were placed in this encounter.  Meds ordered this encounter  Medications   amoxicillin (AMOXIL) 500 MG tablet    Sig: Take 4 tablets (2,000 mg total) by mouth as directed. 1 HOUR PRIOR TO DENTAL APPOINTMENTS    Dispense:  12 tablet    Refill:  6   clopidogrel (PLAVIX) 75 MG tablet    Sig: Take 1 tablet (75 mg total) by mouth daily. START ON 6/3    Dispense:  90 tablet    Refill:  3    STOP ELIQUIS ON 6/2 AND START PLAVIX ON 6/3   apixaban (ELIQUIS) 5 MG TABS tablet    Sig: Take 1 tablet (5 mg total) by mouth 2 (two) times daily. STOP ON 6/2    Dispense:  180 tablet    Refill:  1    STOP ELIQUIS ON 6/2 AND START PLAVIX ON 6/3    Patient Instructions  Medication Instructions:  Your physician has recommended you make the following change in your medication:  STOP ELIQUIS ON  6/2  START PLAVIX 75 MG DAILY ON  6/3 Start Amoxicillin 500 mg, take 4 tablets by mouth 1 hour prior to dental procedures and cleanings.    *If you need a refill on your cardiac medications before your next appointment, please call your pharmacy*   Lab Work: NONE If you have labs (blood work) drawn today and your tests are completely normal, you will receive your results only by: MyChart Message (if you have MyChart) OR A paper copy in the mail If you have any lab test that is abnormal or we need to change your treatment, we will call you to review the results.   Testing/Procedures: NONE   Follow-Up: At [facility], you and your health needs are our priority.  As part of our continuing mission to provide you with exceptional heart care, we have created designated Provider Care Teams.  These Care Teams include your primary Cardiologist (physician) and Advanced Practice Providers (APPs -  Physician Assistants and Nurse Practitioners) who all work together to provide you with the care you need, when you need it.  We recommend signing up for the patient portal called "MyChart".  Sign up information is provided on this After Visit Summary.  MyChart is used to connect with patients for Virtual Visits (Telemedicine).  Patients are able to view lab/test results, encounter notes, upcoming appointments, etc.  Non-urgent messages can be sent to your provider as well.   To learn more about what you can do with MyChart, go to [email or web address].    Your next appointment:   KEEP SCHEDULED FOLLOW-UP   Signed, Georgie Chard, NP  08/15/2022 1:28 PM    [facility]

## 2022-08-09 NOTE — Progress Notes (Signed)
Patient ID: Shelly Bowers, female   DOB: 1950/04/08, 72 y.o.   MRN: [id number] Care Management & Coordination Services Pharmacy Team  Reason for Encounter: Chart prep for initial visit with Johny Drilling D on 08/14/22 at 10 am in office.   Spoke with patient on 08/10/2022   Have you seen any other providers since your last visit? Patient reports none  Any changes in your medications or health? Patient reports none  Any side effects from any medications? Patient reports no but she is currently at the beach and had a weird episode of extreme sweating, nausea and was very dizzy had some Orange juice and a scrambled egg and recovered after a bit had never had that happen to her before, she reports she has recently had a watchman procedure and is due for follow up soon and will mention it then.  Do you have an symptoms or problems not managed by your medications? Patient reports none  Any concerns about your health right now? Patient reports none  Has your provider asked that you check blood pressure, blood sugar, or follow special diet at home? Patient reports she does not monitor her pressures as it always seems to be fine at all of her appointments.  Do you get any type of exercise on a regular basis? Patient reports she does some house work and gardening and yard work, was exercising at a gym prior to her procedure is looking forward to starting back again and walking more.   Can you think of a goal you would like to reach for your health? Patient repots she would like to eventually be off the eliquis  Do you have any problems getting your medications? Patient reports no she states her entresto and eliquis are expensive and a stretch but she is managing with some adjustments, she states she did not in the past qualify for patient assistance programs.   Is there anything that you would like to discuss during the appointment? Patient reports none  Patient aware to bring blood pressure  cuff, medications that do not need refrigeration and supplements to appointment   Chart review:  Recent office visits:  04/11/22 Philip Aspen, Limmie Freda, MD - Patient presented for Preventative health exam and other concerns. No medication changes.   Recent consult visits:  06/25/22 Filbert Schilder, NP (Cardiology) - Patient presented for Atrial fibrillation unspecified type and other concerns. No medication changes.  05/04/22 Lanier Prude, MD (Cardiology) - Patient presented for Persistent atrial fibrillation and other concerns. No medication changes.   03/15/22 Cynda Acres (OBGYN) - Claims encounter for Unspecified Atrial Fibrillation. No other visit details available.   02/20/22 Tyrell Antonio, MD (Ortho) - Patient presented for Pain in left hip. No medication changes. Steroid inj administered.   Hospital visits:  Medication Reconciliation was completed by comparing discharge summary, patient's EMR and Pharmacy list, and upon discussion with patient.  Patient presented to [facility] on 07[facility]12/22 due to Presence of Watchman left atrial appendage closure device. Patient was present for 9 hours.  New?Medications Started at [facility] Discharge:?? -started  none  Medication Changes at Hospital Discharge: -Changed  none  Medications Discontinued at Hospital Discharge: -Stopped  None  Medications that remain the same after Hospital Discharge:??  -All other medications will remain the same.    Fill History : ELIQUIS 5 MG TABLET 06/19/2022 60   ATORVASTATIN 20 MG TABLET 05/15/2022 90   CELECOXIB 200 MG CAPSULE 05/16/2022 90   VITAMIN  D2 1.25MG (50,000 UNIT) 07/22/2022 84   furosemide 20 mg tablet 05/16/2021   LEVOTHYROXINE 50 MCG TABLET 06/09/2022 90   METOPROLOL SUCC ER 25 MG TAB 07/02/2022 90   METRONIDAZOLE TOPICAL 0.75% GL 04/10/2022 30   ENTRESTO 24 MG-26 MG TABLET 07/23/2022 90    Star Rating Drugs:  Atorvastatin 20 mg - Last filled  05/15/22 90 DS at [facility]    Care Gaps: COVID Booster - Overdue PAP Smear - Postponed AWV - 04/11/22   Pamala Duffel CMA Clinical Pharmacist Assistant [phone]

## 2022-08-13 IMAGING — CR DG CHEST 2V
2 series · 2 of 2 positions shown · non-contrast
Comparison: Chest radiograph dated 05/15/2016.

CLINICAL DATA: 70-year-old female with tachycardia.

EXAM:
CHEST - 2 VIEW

[w chest lat]
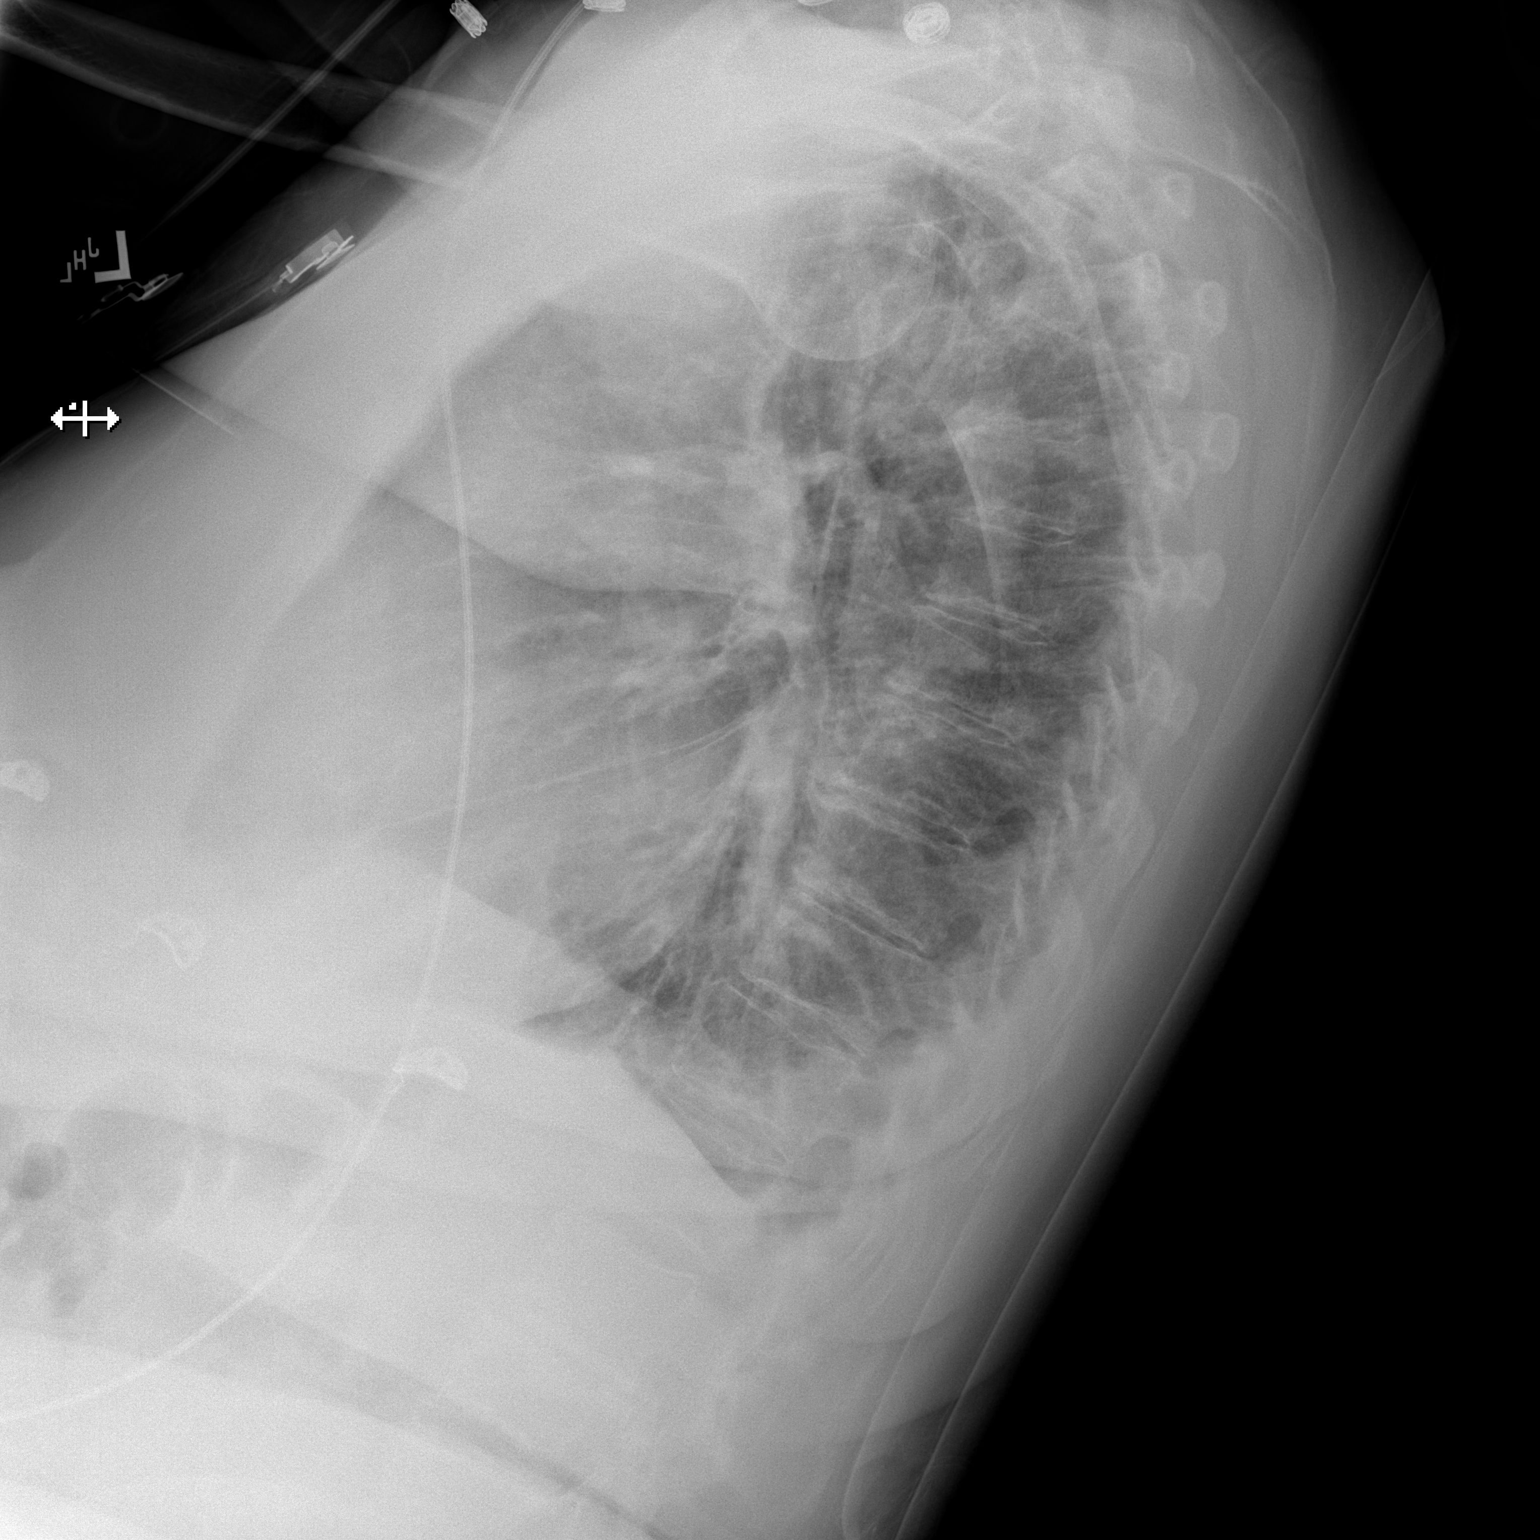

[x chest ap]
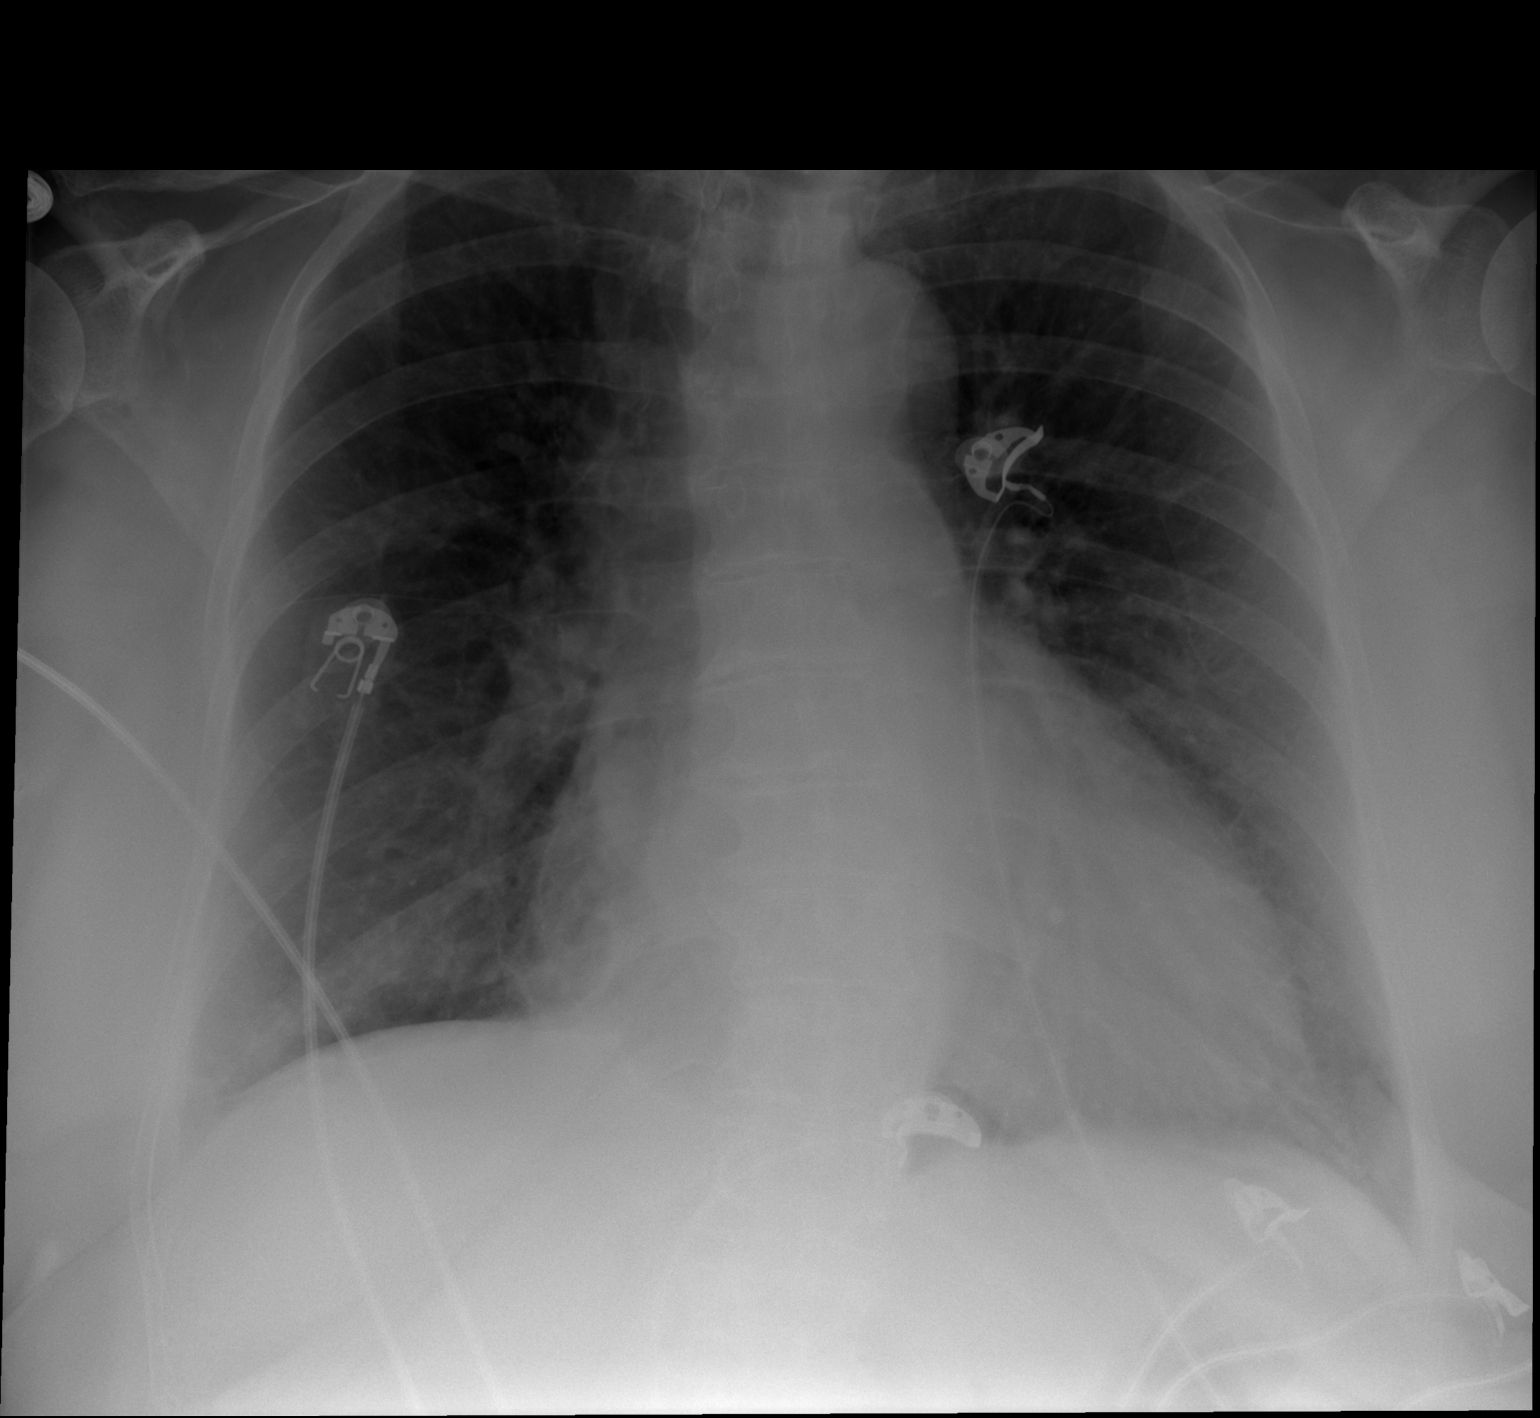

[2 of 2 positions shown; findings below may reference images not displayed]

FINDINGS: There is cardiomegaly with vascular congestion. No focal
consolidation, pleural effusion, or pneumothorax. Bibasilar
atelectasis. Degenerative changes of the spine. No acute osseous
pathology.
IMPRESSION: Cardiomegaly with vascular congestion. No focal consolidation.

## 2022-08-13 NOTE — Progress Notes (Unsigned)
Care Management & Coordination Services Pharmacy Note  08/14/2022 Name:  Shelly Bowers MRN:  [id number] DOB:  1950-05-23  Summary: BP at goal <140/90 Denies any symptoms of Afib or signs of bleed  Recommendations/Changes made from today's visit: -Counseled on signs/symptoms of low/high BP and afib -Counseled on importance of routine home BP/HR monitoring -Counseled on avoidance of NSAIDs and monitoring for signs of major bleed -Approved for Entresto assistance through [facility] for this year  Follow up plan: BP call in Sept Pharmacist visit in Dec   Subjective: Shelly Bowers is an 72 y.o. year old female who is a primary patient of Philip Aspen, Limmie Dazja, MD.  The care coordination team was consulted for assistance with disease management and care coordination needs.    Engaged with patient face to face for initial visit. Patient brings all of her medications to the office visit. Does have a beach place in [facility] that she tries to go to at least once a month for a week during periods of warm weather. 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 Lives with her husband and one rescue dog (a Orthoptist mix). Likes to spend her free time playing bridge and reading books.  Recent office visits: 04/11/22 Philip Aspen, Limmie Lamoyne, MD - Patient presented for Preventative health exam and other concerns. No medication changes   Recent consult visits: 06/25/22 Filbert Schilder, NP (Cardiology) - Patient presented for Atrial fibrillation unspecified type and other concerns. No medication changes.   05/04/22 Lanier Prude, MD (Cardiology) - Patient presented for Persistent atrial fibrillation and other concerns. No medication changes.    03/15/22 Cynda Acres (OBGYN) - Claims encounter for Unspecified Atrial Fibrillation. No other visit details available.    02/20/22 Tyrell Antonio, MD (Ortho) - Patient presented for Pain in left hip. No  medication changes. Steroid inj administered.   Hospital visits: 07/12/22 [facility] - Presence of watchman left atrial appendage closure, LOS 9 hours, no med changes   Objective:  Lab Results  Component Value Date   CREATININE 0.82 06/25/2022   BUN 14 06/25/2022   GFR 64.92 04/11/2022   EGFR 75 06/25/2022   GFRNONAA >60 12/19/2020   NA 141 06/25/2022   K 5.2 06/25/2022   CALCIUM 9.9 06/25/2022   CO2 25 06/25/2022   GLUCOSE 84 06/25/2022    Lab Results  Component Value Date/Time   HGBA1C 5.5 04/11/2022 10:59 AM   HGBA1C 5.7 02/10/2021 10:02 AM   GFR 64.92 04/11/2022 10:59 AM   GFR 63.72 02/10/2021 10:02 AM    Last diabetic Eye exam: No results found for: "HMDIABEYEEXA"  Last diabetic Foot exam: No results found for: "HMDIABFOOTEX"   Lab Results  Component Value Date   CHOL 166 07/17/2022   HDL 53.10 07/17/2022   LDLCALC 90 07/17/2022   LDLDIRECT 191.9 04/14/2013   TRIG 115.0 07/17/2022   CHOLHDL 3 07/17/2022       Latest Ref Rng & Units 04/11/2022   10:59 AM 05/31/2021    2:12 PM 02/10/2021   10:02 AM  Hepatic Function  Total Protein 6.0 - 8.3 g/dL 6.8  6.6  6.7   Albumin 3.5 - 5.2 g/dL 4.5  4.6  4.4   AST 0 - 37 U/L 17  16  16    ALT 0 - 35 U/L 12  13  11    Alk Phosphatase 39 - 117 U/L 62  81  80   Total Bilirubin 0.2 -  1.2 mg/dL 0.9  0.7  0.7     Lab Results  Component Value Date/Time   TSH 1.96 04/11/2022 10:59 AM   TSH 2.68 10/09/2021 01:56 PM   FREET4 0.93 08/17/2021 10:29 AM   FREET4 0.83 02/15/2021 02:09 PM       Latest Ref Rng & Units 06/25/2022   11:23 AM 04/11/2022   10:59 AM 02/10/2021   10:02 AM  CBC  WBC 3.4 - 10.8 x10E3/uL 4.9  4.8  4.4   Hemoglobin 11.1 - 15.9 g/dL 16.1  09.6  04.5   Hematocrit 34.0 - 46.6 % 36.4  36.9  37.4   Platelets 150 - 450 x10E3/uL 197  187.0  173.0     Lab Results  Component Value Date/Time   VD25OH 35.43 07/17/2022 10:04 AM   VD25OH 28.92 (L) 04/11/2022 10:59 AM   VITAMINB12 881 02/10/2021 10:02  AM   VITAMINB12 364 01/22/2020 11:15 AM    Clinical ASCVD: No  The 10-year ASCVD risk score (Arnett DK, et al., 2019) is: 13.1%   Values used to calculate the score:     Age: 72 years     Sex: Female     Is Non-Hispanic African American: No     Diabetic: No     Tobacco smoker: No     Systolic Blood Pressure: 106 mmHg     Is BP treated: Yes     HDL Cholesterol: 53.1 mg/dL     Total Cholesterol: 166 mg/dL       07/02/8117    1:47 PM 04/11/2022   10:39 AM 02/07/2022    3:19 PM  Depression screen PHQ 2/9  Decreased Interest 0 0 0  Down, Depressed, Hopeless 0 0 0  PHQ - 2 Score 0 0 0  Altered sleeping  0 0  Tired, decreased energy  0 0  Change in appetite  0 0  Feeling bad or failure about yourself   0 0  Trouble concentrating  0 0  Moving slowly or fidgety/restless  0 0  Suicidal thoughts  0 0  PHQ-9 Score  0 0  Difficult doing work/chores  Not difficult at all Not difficult at all     Social History   Tobacco Use  Smoking Status Never  Smokeless Tobacco Never   BP Readings from Last 3 Encounters:  08/14/22 106/65  07/12/22 (!) 148/85  06/25/22 (!) 144/76   Pulse Readings from Last 3 Encounters:  08/14/22 65  07/12/22 60  06/25/22 (!) 54   Wt Readings from Last 3 Encounters:  07/12/22 189 lb (85.7 kg)  06/25/22 193 lb 6.4 oz (87.7 kg)  05/04/22 190 lb 6.4 oz (86.4 kg)   BMI Readings from Last 3 Encounters:  07/12/22 31.45 kg/m  06/25/22 31.69 kg/m  05/04/22 31.20 kg/m    Allergies  Allergen Reactions   Codeine Nausea Only   Hydrocodone Nausea And Vomiting    Medications Reviewed Today     Reviewed by Sherrill Raring, RPH (Pharmacist) on 08/14/22 at 1054  Med List Status: <None>   Medication Order Taking? Sig Documenting Provider Last Dose Status Informant  apixaban (ELIQUIS) 5 MG TABS tablet [id number] Yes TAKE 1 TABLET BY MOUTH TWICE A DAY Hochrein, Fayrene Fearing, MD Taking Active Self  atorvastatin (LIPITOR) 20 MG tablet [id number] Yes Take 1 tablet  (20 mg total) by mouth daily. Philip Aspen, Limmie Dalena, MD Taking Active Self  CALCIUM-VITAMIN D PO [id number] Yes Take 1 tablet by mouth daily. [provider] Taking  Active Self  celecoxib (CELEBREX) 200 MG capsule [id number] Yes TAKE 1 CAPSULE BY MOUTH 2 TIMES DAILY AS NEEDED  Patient taking differently: Take 200 mg by mouth 2 (two) times daily as needed for moderate pain.   Gordy Savers, MD Taking Active Self           Med Note Gunnar Fusi, MELISSA R   Mon Jul 09, 2022 10:28 AM) Does not take when taking Ibuprofen   cetirizine (ZYRTEC) 10 MG tablet [id number]  Take 10 mg by mouth daily. [provider]  Active Self           Med Note Gunnar Fusi, MELISSA R   Mon Jul 09, 2022 10:23 AM) Aller-Tec  cholecalciferol (VITAMIN D3) 25 MCG (1000 UNIT) tablet [id number] Yes Take 1,000 Units by mouth daily. [provider] Taking Active Self  cyanocobalamin (VITAMIN B12) 1000 MCG tablet [id number] Yes Take 1,000 mcg by mouth daily. [provider] Taking Active Self  fluticasone (FLONASE) 50 MCG/ACT nasal spray [id number] No Place 1-2 sprays into both nostrils daily as needed for allergies or rhinitis.  Patient not taking: Reported on 08/14/2022   [provider] Not Taking Active Self  folic acid (FOLVITE) 800 MCG tablet [id number] Yes Take 800 mcg by mouth daily. [provider] Taking Active Self  furosemide (LASIX) 20 MG tablet [id number] Yes Take 20 mg by mouth daily as needed for fluid. [provider] Taking Active Self  ibuprofen (ADVIL,MOTRIN) 200 MG tablet [id number]  Take 400 mg by mouth every 6 (six) hours as needed for headache, fever or mild pain. [provider]  Active Self           Med Note Gunnar Fusi, MELISSA R   Mon Jul 09, 2022 10:28 AM) Does not take when taking Celebrex   levothyroxine (SYNTHROID) 50 MCG tablet [id number] Yes TAKE 1 TABLET BY MOUTH EVERY DAY  Patient taking differently: Take 50 mcg by mouth daily  before breakfast.   Philip Aspen, Limmie Shrita, MD Taking Active Self  metoprolol succinate (TOPROL-XL) 25 MG 24 hr tablet [id number] Yes TAKE 1/2 TABLET BY MOUTH EVERY DAY Lanier Prude, MD Taking Active Self  metroNIDAZOLE (METROGEL) 0.75 % gel [id number] Yes Apply 1 application topically at bedtime. [provider] Taking Active Self           Med Note Thelma Barge Dec 19, 2020  2:09 PM)    naphazoline-pheniramine (VISINE) 0.025-0.3 % ophthalmic solution [id number]  Place 1 drop into both eyes as needed for eye irritation. [provider]  Active Self  sacubitril-valsartan (ENTRESTO) 24-26 MG [id number] Yes Take 1 tablet by mouth 2 (two) times daily. Lanier Prude, MD Taking Active Self  Vitamin D, Ergocalciferol, (DRISDOL) 1.25 MG (50000 UNIT) CAPS capsule [id number] Yes Take 1 capsule (50,000 Units total) by mouth every 7 (seven) days for 12 doses. Philip Aspen, Limmie Blayklee, MD Taking Active   vitamin E 180 MG (400 UNITS) capsule [id number] Yes Take 400 Units by mouth daily. [provider] Taking Active Self            SDOH:  (Social Determinants of Health) assessments and interventions performed: Yes SDOH Interventions    Flowsheet Row Care Coordination from 08/14/2022 in [facility] Telephone from 05/08/2022 in [facility] [employer] Care Coordination  SDOH Interventions    Food Insecurity Interventions Intervention Not Indicated Intervention Not Indicated  Housing Interventions Intervention Not Indicated Intervention Not Indicated  Medication Assistance:  Entresto obtained through [facility] medication assistance program.  Enrollment ends 07/14/23  Medication Access: Name and location of current pharmacy:  [id number] [place], [place] - [address] [place] [address] Phone: [phone] Fax: [phone]  [id number] [address], [place] - [address] [place] [address] Phone: [phone] Fax: [phone]  Within the past 30 days, how often has patient missed a dose of medication? None Is a pillbox or other method used to improve adherence? Yes  Factors that may affect medication adherence? no barriers identified Are meds synced by current pharmacy? No  Are meds delivered by current pharmacy? No  Does patient experience delays in picking up medications due to transportation concerns? No   Compliance/Adherence/Medication fill history: Care Gaps: COVID Booster - last 03/22/21  Star-Rating Drugs: Atorvastatin 20 mg - [facility]    Assessment/Plan Hypertension (BP goal <140/90) -Controlled -Current treatment: Lasix 20mg  1 qd prn fluid Appropriate, Effective, Safe, Accessible Metoprolol XL 25mg  1 qd Appropriate, Effective, Safe, Accessible See HF -Medications previously tried: HCTZ  -Current home readings: not checking at home -Current dietary habits: mindful of salt intake -Current exercise habits: Not discussed -Denies hypotensive/hypertensive symptoms -Educated on BP goals and benefits of medications for prevention of heart attack, stroke and kidney damage; Daily salt intake goal < 2300 mg; Importance of home blood pressure monitoring; Proper BP monitoring technique; Symptoms of hypotension and importance of maintaining adequate hydration; -Counseled to monitor BP at home weekly, document, and provide log at future appointments -Recommended to continue current medication  Hyperlipidemia: (LDL goal < 100) -Not assessed today -Current treatment: Atorvastatin 20mg  1 qd Appropriate, Effective, Safe, Accessible  Atrial Fibrillation (Goal: prevent stroke and major bleeding) -Controlled -CHADSVASC: 4 -Current treatment: Eliquis 5mg  1 BID Appropriate, Effective, Safe, Accessible Metoprolol XL 25mg   1qd Appropriate, Effective, Safe,  Accessible -Medications previously tried: Amiodarone -Home BP and HR readings: not checking bp, wears a watch at all times that monitors her heart rate, typically in the 50-60s  -Counseled on increased risk of stroke due to Afib and benefits of anticoagulation for stroke prevention; importance of adherence to anticoagulant exactly as prescribed; bleeding risk associated with Eliquis and importance of self-monitoring for signs/symptoms of bleeding; avoidance of NSAIDs due to increased bleeding risk with anticoagulants; importance of regular laboratory monitoring; seeking medical attention after a head injury or if there is blood in the urine/stool; -Recommended to continue current medication  Heart Failure (Goal: manage symptoms and prevent exacerbations) -Not assessed today -Last ejection fraction: 55-60% (Date: 07/12/22) -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%) -NYHA Class: II (slight limitation of activity) -AHA HF Stage: C (Heart disease and symptoms present) -Current treatment: Entresto 24-26mg  1 BID Appropriate, Effective, Safe, Accessible Metoprolol XL 25mg  1 qd Appropriate, Effective, Safe, Accessible Lasix 20mg  1 qd prn fluid Appropriate, Effective, Safe, Accessible   Osteoporosis / Osteopenia (Goal Prevent bone loss and bone fracture) -Not assessed today -Current treatment  Calcium + Vit D Appropriate, Effective, Safe, Accessible  Vit D Def (Goal: Vit D WNL) -Not assessed today -Current treatment  Vit D 50,000 units once weekly Appropriate, Effective, Safe, Accessible  Hypothyroidism (Goal: TSH WNL) -Not assessed today -Current treatment  Levothyroxine 1 qd Appropriate, Effective, Safe, Accessible  Chronic Pain (Goal: Reduce pain to a level where ADLs can still be acheived) -Not  assessed today -Current treatment  Celebrex 200mg  1 BID prn Appropriate, Effective, Safe, Accessible  OTC  -Current treatment  Zyrtec 10mg  1 qd Appropriate, Effective,  Safe, Accessible Vit D 1000 units 1 qd Appropriate, Effective, Safe, Accessible Flonase prn Appropriate, Effective, Safe, Accessible Folic Acid 1 qd Appropriate, Effective, Safe, Accessible Ibuprofen 200mg  2 tabs q 6h prn Appropriate, Effective, Safe, Accessible Visine eye drops prn Appropriate, Effective, Safe, Accessible Vit E 400 units 1 qd Appropriate, Effective, Safe, Accessible  Sherrill Raring Clinical Pharmacist [phone]

## 2022-08-14 ENCOUNTER — Ambulatory Visit: Payer: Medicare HMO

## 2022-08-14 DIAGNOSIS — D485 Neoplasm of uncertain behavior of skin: Secondary | ICD-10-CM | POA: Diagnosis not present

## 2022-08-14 DIAGNOSIS — D1801 Hemangioma of skin and subcutaneous tissue: Secondary | ICD-10-CM | POA: Diagnosis not present

## 2022-08-14 DIAGNOSIS — L812 Freckles: Secondary | ICD-10-CM | POA: Diagnosis not present

## 2022-08-14 DIAGNOSIS — L820 Inflamed seborrheic keratosis: Secondary | ICD-10-CM | POA: Diagnosis not present

## 2022-08-14 DIAGNOSIS — L718 Other rosacea: Secondary | ICD-10-CM | POA: Diagnosis not present

## 2022-08-14 DIAGNOSIS — L821 Other seborrheic keratosis: Secondary | ICD-10-CM | POA: Diagnosis not present

## 2022-08-14 DIAGNOSIS — L304 Erythema intertrigo: Secondary | ICD-10-CM | POA: Diagnosis not present

## 2022-08-14 DIAGNOSIS — D225 Melanocytic nevi of trunk: Secondary | ICD-10-CM | POA: Diagnosis not present

## 2022-08-14 DIAGNOSIS — D2262 Melanocytic nevi of left upper limb, including shoulder: Secondary | ICD-10-CM | POA: Diagnosis not present

## 2022-08-14 DIAGNOSIS — D2272 Melanocytic nevi of left lower limb, including hip: Secondary | ICD-10-CM | POA: Diagnosis not present

## 2022-08-14 DIAGNOSIS — D2271 Melanocytic nevi of right lower limb, including hip: Secondary | ICD-10-CM | POA: Diagnosis not present

## 2022-08-15 ENCOUNTER — Ambulatory Visit: Payer: Medicare HMO | Attending: Cardiovascular Disease | Admitting: Cardiology

## 2022-08-15 VITALS — BP 126/74 | HR 49 | Ht 65.0 in | Wt 193.2 lb

## 2022-08-15 DIAGNOSIS — Z95818 Presence of other cardiac implants and grafts: Secondary | ICD-10-CM | POA: Diagnosis not present

## 2022-08-15 DIAGNOSIS — I10 Essential (primary) hypertension: Secondary | ICD-10-CM | POA: Diagnosis not present

## 2022-08-15 DIAGNOSIS — I4819 Other persistent atrial fibrillation: Secondary | ICD-10-CM | POA: Diagnosis not present

## 2022-08-15 DIAGNOSIS — I5022 Chronic systolic (congestive) heart failure: Secondary | ICD-10-CM | POA: Diagnosis not present

## 2022-08-15 DIAGNOSIS — I429 Cardiomyopathy, unspecified: Secondary | ICD-10-CM

## 2022-08-15 MED ORDER — CLOPIDOGREL BISULFATE 75 MG PO TABS: 75.0000 mg | ORAL_TABLET | Freq: Every day | ORAL | 3 refills | Status: DC

## 2022-08-15 MED ORDER — APIXABAN 5 MG PO TABS: 5.0000 mg | ORAL_TABLET | Freq: Two times a day (BID) | ORAL | 1 refills | Status: DC

## 2022-08-15 MED ORDER — AMOXICILLIN 500 MG PO TABS: 2000.0000 mg | ORAL_TABLET | ORAL | 6 refills | Status: DC

## 2022-08-15 NOTE — Patient Instructions (Signed)
Medication Instructions:  Your physician has recommended you make the following change in your medication:  STOP ELIQUIS ON 6/2  START PLAVIX 75 MG DAILY ON 6/3 Start Amoxicillin 500 mg, take 4 tablets by mouth 1 hour prior to dental procedures and cleanings.    *If you need a refill on your cardiac medications before your next appointment, please call your pharmacy*   Lab Work: NONE If you have labs (blood work) drawn today and your tests are completely normal, you will receive your results only by: MyChart Message (if you have MyChart) OR A paper copy in the mail If you have any lab test that is abnormal or we need to change your treatment, we will call you to review the results.   Testing/Procedures: NONE   Follow-Up: At [facility], you and your health needs are our priority.  As part of our continuing mission to provide you with exceptional heart care, we have created designated Provider Care Teams.  These Care Teams include your primary Cardiologist (physician) and Advanced Practice Providers (APPs -  Physician Assistants and Nurse Practitioners) who all work together to provide you with the care you need, when you need it.  We recommend signing up for the patient portal called "MyChart".  Sign up information is provided on this After Visit Summary.  MyChart is used to connect with patients for Virtual Visits (Telemedicine).  Patients are able to view lab/test results, encounter notes, upcoming appointments, etc.  Non-urgent messages can be sent to your provider as well.   To learn more about what you can do with MyChart, go to [email or web address].    Your next appointment:   KEEP SCHEDULED FOLLOW-UP

## 2022-08-21 DIAGNOSIS — G4733 Obstructive sleep apnea (adult) (pediatric): Secondary | ICD-10-CM | POA: Diagnosis not present

## 2022-09-04 ENCOUNTER — Other Ambulatory Visit: Payer: Self-pay | Admitting: Internal Medicine

## 2022-09-04 DIAGNOSIS — E039 Hypothyroidism, unspecified: Secondary | ICD-10-CM

## 2022-09-10 ENCOUNTER — Encounter (HOSPITAL_COMMUNITY): Payer: Self-pay

## 2022-09-10 ENCOUNTER — Telehealth (HOSPITAL_COMMUNITY): Payer: Self-pay | Admitting: *Deleted

## 2022-09-10 ENCOUNTER — Telehealth (HOSPITAL_COMMUNITY): Payer: Self-pay | Admitting: Emergency Medicine

## 2022-09-10 NOTE — Telephone Encounter (Signed)
Patient returning call about her upcoming cardiac imaging study; pt verbalizes understanding of appt date/time, parking situation and where to check in, pre-test NPO status, and verified current allergies; name and call back number provided for further questions should they arise  Larey Brick RN Navigator Cardiac Imaging [facility] [phone] office [phone] cell  Patient aware to arrive at 11am.

## 2022-09-10 NOTE — Telephone Encounter (Signed)
Attempted to call patient regarding upcoming cardiac CT appointment. °Left message on voicemail with name and callback number °Deedee Lybarger RN Navigator Cardiac Imaging °[facility] °[phone] Office °[phone] Cell ° °

## 2022-09-11 ENCOUNTER — Ambulatory Visit (HOSPITAL_COMMUNITY)
Admission: RE | Admit: 2022-09-11 | Discharge: 2022-09-11 | Disposition: A | Discharge status: Home / Self Care | Payer: Medicare HMO | Source: Ambulatory Visit | Attending: Cardiology | Admitting: Cardiology

## 2022-09-11 ENCOUNTER — Other Ambulatory Visit: Payer: Self-pay | Admitting: Cardiology

## 2022-09-11 DIAGNOSIS — I4891 Unspecified atrial fibrillation: Secondary | ICD-10-CM

## 2022-09-11 DIAGNOSIS — Z95818 Presence of other cardiac implants and grafts: Secondary | ICD-10-CM

## 2022-09-11 MED ORDER — IOHEXOL 350 MG/ML SOLN
95.0000 mL | Freq: Once | INTRAVENOUS | Status: AC | PRN
  Administered 2022-09-11: 95 mL via INTRAVENOUS

## 2022-09-21 DIAGNOSIS — G4733 Obstructive sleep apnea (adult) (pediatric): Secondary | ICD-10-CM | POA: Diagnosis not present

## 2022-10-10 ENCOUNTER — Encounter: Payer: Self-pay | Admitting: Internal Medicine

## 2022-10-10 ENCOUNTER — Ambulatory Visit: Payer: Medicare HMO | Admitting: Internal Medicine

## 2022-10-10 VITALS — BP 120/84 | HR 50 | Temp 98.2°F | Wt 190.4 lb

## 2022-10-10 DIAGNOSIS — Z95818 Presence of other cardiac implants and grafts: Secondary | ICD-10-CM

## 2022-10-10 DIAGNOSIS — E559 Vitamin D deficiency, unspecified: Secondary | ICD-10-CM

## 2022-10-10 DIAGNOSIS — E782 Mixed hyperlipidemia: Secondary | ICD-10-CM | POA: Diagnosis not present

## 2022-10-10 DIAGNOSIS — I10 Essential (primary) hypertension: Secondary | ICD-10-CM | POA: Diagnosis not present

## 2022-10-10 DIAGNOSIS — I480 Paroxysmal atrial fibrillation: Secondary | ICD-10-CM | POA: Diagnosis not present

## 2022-10-10 DIAGNOSIS — E039 Hypothyroidism, unspecified: Secondary | ICD-10-CM | POA: Diagnosis not present

## 2022-10-10 LAB — LIPID PANEL
Cholesterol: 195 mg/dL (ref 0–200)
HDL: 62.4 mg/dL (ref >39.00)
LDL Cholesterol: 111 mg/dL — ABNORMAL HIGH (ref 0–99)
NonHDL: 133.04
Total CHOL/HDL Ratio: 3
Triglycerides: 110 mg/dL (ref 0.0–149.0)
VLDL: 22 mg/dL (ref 0.0–40.0)

## 2022-10-10 LAB — VITAMIN D 25 HYDROXY (VIT D DEFICIENCY, FRACTURES): VITD: 37.45 ng/mL (ref 30.00–100.00)

## 2022-10-10 NOTE — Assessment & Plan Note (Signed)
TSH was within range at last visit, continue 50 mcg of levothyroxine daily.

## 2022-10-10 NOTE — Assessment & Plan Note (Signed)
Well controlled on current

## 2022-10-10 NOTE — Progress Notes (Signed)
Patient here for a follow up.

## 2022-10-10 NOTE — Assessment & Plan Note (Signed)
Completed high-dose supplementation, recheck levels today.

## 2022-10-10 NOTE — Progress Notes (Signed)
Established Patient Office Visit     CC/Reason for Visit: Chronic medical conditions  HPI: Shelly Bowers is a 72 y.o. female who is coming in today for the above mentioned reasons. Past Medical History is significant for: Hypertension, hyperlipidemia, hypothyroidism, vitamin D deficiency, atrial fibrillation status post Watchman procedure.  She has been doing very well.  At last visit her atorvastatin was increased from 10 to 20 mg and she was treated with vitamin D for deficiency.  Here to recheck levels.   Past Medical/Surgical History: Past Medical History:  Diagnosis Date   Atrial fibrillation (HCC)    Cardiomyopathy (HCC)    HTN (hypertension)    Hyperlipidemia    Osteopenia    Presence of Watchman left atrial appendage closure device 07/12/2022   31mm Watchman FLX with Dr. Lalla Brothers    Past Surgical History:  Procedure Laterality Date   ATRIAL FIBRILLATION ABLATION N/A 11/21/2020   Procedure: ATRIAL FIBRILLATION ABLATION;  Surgeon: Lanier Prude, MD;  Location: [facility];  Service: Cardiovascular;  Laterality: N/A;   AUGMENTATION MAMMAPLASTY Bilateral    BREAST EXCISIONAL BIOPSY Left    BUBBLE STUDY  08/25/2020   Procedure: BUBBLE STUDY;  Surgeon: Christell Constant, MD;  Location: [facility]OS[facility];  Service: Cardiovascular;;   CARDIOVERSION N/A 08/25/2020   Procedure: CARDIOVERSION;  Surgeon: Christell Constant, MD;  Location: [facility];  Service: Cardiovascular;  Laterality: N/A;   CARDIOVERSION N/A 12/26/2020   Procedure: CARDIOVERSION;  Surgeon: Christell Constant, MD;  Location: [facility];  Service: Cardiovascular;  Laterality: N/A;   INCISION AND DRAINAGE ABSCESS Left 12/27/2015   Procedure: INCISION AND DRAINAGE ABSCESS;  Surgeon: Cindee Salt, MD;  Location: [facility];  Service: Orthopedics;  Laterality: Left;   LEFT ATRIAL APPENDAGE OCCLUSION N/A 07/12/2022   Procedure: LEFT ATRIAL APPENDAGE OCCLUSION;  Surgeon:  Lanier Prude, MD;  Location: [facility];  Service: Cardiovascular;  Laterality: N/A;   TEE WITHOUT CARDIOVERSION N/A 08/25/2020   Procedure: TRANSESOPHAGEAL ECHOCARDIOGRAM (TEE);  Surgeon: Christell Constant, MD;  Location: [facility] END[facility]COPY;  Service: Cardiovascular;  Laterality: N/A;   TEE WITHOUT CARDIOVERSION N/A 07/12/2022   Procedure: TRANSESOPHAGEAL ECHOCARDIOGRAM;  Surgeon: Lanier Prude, MD;  Location: [facility] INVASIVE CV LAB;  Service: Cardiovascular;  Laterality: N/A;    Social History:  reports that she has never smoked. She has never used smokeless tobacco. She reports current alcohol use of about 14.0 standard drinks of alcohol per week. She reports that she does not use drugs.  Allergies: Allergies  Allergen Reactions   Codeine Nausea Only   Hydrocodone Nausea And Vomiting    Family History:  Family History  Problem Relation Age of Onset   Breast cancer Mother 89   Atrial fibrillation Father      Current Outpatient Medications:    amoxicillin (AMOXIL) 500 MG tablet, Take 4 tablets (2,000 mg total) by mouth as directed. 1 HOUR PRIOR TO DENTAL APPOINTMENTS, Disp: 12 tablet, Rfl: 6   atorvastatin (LIPITOR) 20 MG tablet, Take 1 tablet (20 mg total) by mouth daily., Disp: 90 tablet, Rfl: 1   CALCIUM-VITAMIN D PO, Take 1 tablet by mouth daily., Disp: , Rfl:    celecoxib (CELEBREX) 200 MG capsule, TAKE 1 CAPSULE BY MOUTH 2 TIMES DAILY AS NEEDED (Patient taking differently: Take 200 mg by mouth 2 (two) times daily as needed for moderate pain.), Disp: 90 capsule, Rfl: 1   clopidogrel (PLAVIX) 75 MG tablet, Take 1 tablet (  75 mg total) by mouth daily. START ON 6/3, Disp: 90 tablet, Rfl: 3   cyanocobalamin (VITAMIN B12) 1000 MCG tablet, Take 1,000 mcg by mouth daily., Disp: , Rfl:    fluticasone (FLONASE) 50 MCG/ACT nasal spray, Place 1-2 sprays into both nostrils daily as needed for allergies or rhinitis., Disp: , Rfl:    folic acid (FOLVITE) 800 MCG tablet, Take 800  mcg by mouth daily., Disp: , Rfl:    furosemide (LASIX) 20 MG tablet, Take 20 mg by mouth daily as needed for fluid., Disp: , Rfl:    ibuprofen (ADVIL,MOTRIN) 200 MG tablet, Take 400 mg by mouth every 6 (six) hours as needed for headache, fever or mild pain., Disp: , Rfl:    levothyroxine (SYNTHROID) 50 MCG tablet, Take 1 tablet (50 mcg total) by mouth daily before breakfast., Disp: 90 tablet, Rfl: 1   metoprolol succinate (TOPROL-XL) 25 MG 24 hr tablet, TAKE 1/2 TABLET BY MOUTH EVERY DAY, Disp: 45 tablet, Rfl: 1   metroNIDAZOLE (METROGEL) 0.75 % gel, Apply 1 application topically at bedtime., Disp: , Rfl:    naphazoline-pheniramine (VISINE) 0.025-0.3 % ophthalmic solution, Place 1 drop into both eyes as needed for eye irritation., Disp: , Rfl:    sacubitril-valsartan (ENTRESTO) 24-26 MG, Take 1 tablet by mouth 2 (two) times daily., Disp: 180 tablet, Rfl: 3   vitamin E 180 MG (400 UNITS) capsule, Take 400 Units by mouth daily., Disp: , Rfl:   Review of Systems:  Negative unless indicated in HPI.   Physical Exam: Vitals:   10/10/22 1003  BP: 120/84  Pulse: (!) 50  Temp: 98.2 F (36.8 C)  TempSrc: Oral  SpO2: 95%  Weight: 190 lb 6.4 oz (86.4 kg)    Body mass index is 31.68 kg/m.   Physical Exam Vitals reviewed.  Constitutional:      Appearance: Normal appearance.  HENT:     Head: Normocephalic and atraumatic.  Eyes:     Conjunctiva/sclera: Conjunctivae normal.     Pupils: Pupils are equal, round, and reactive to light.  Cardiovascular:     Rate and Rhythm: Normal rate and regular rhythm.  Pulmonary:     Effort: Pulmonary effort is normal.     Breath sounds: Normal breath sounds.  Skin:    General: Skin is warm and dry.  Neurological:     General: No focal deficit present.     Mental Status: She is alert and oriented to person, place, and time.  Psychiatric:        Mood and Affect: Mood normal.        Behavior: Behavior normal.        Thought Content: Thought content  normal.        Judgment: Judgment normal.      Impression and Plan:  Essential hypertension Assessment & Plan: Well-controlled on current.   Mixed hyperlipidemia Assessment & Plan: At last visit atorvastatin was increased from 10 to 20 mg.  Tolerating well.  Recheck lipids today.  Orders: -     Lipid panel; Future  Vitamin D deficiency Assessment & Plan: Completed high-dose supplementation, recheck levels today.  Orders: -     VITAMIN D 25 Hydroxy (Vit-D Deficiency, Fractures); Future  Paroxysmal atrial fibrillation [facility]) Assessment & Plan: Doing really well after Watchman procedure.  No longer on anticoagulation.   Presence of Watchman left atrial appendage closure device  Acquired hypothyroidism Assessment & Plan: TSH was within range at last visit, continue 50 mcg of levothyroxine daily.  Time spent:32 minutes reviewing chart, interviewing and examining patient and formulating plan of care.     Chaya Jan, MD [facility]

## 2022-10-10 NOTE — Assessment & Plan Note (Signed)
At last visit atorvastatin was increased from 10 to 20 mg.  Tolerating well.  Recheck lipids today.

## 2022-10-10 NOTE — Assessment & Plan Note (Signed)
Doing really well after Watchman procedure.  No longer on anticoagulation.

## 2022-10-11 DIAGNOSIS — D485 Neoplasm of uncertain behavior of skin: Secondary | ICD-10-CM | POA: Diagnosis not present

## 2022-10-14 ENCOUNTER — Other Ambulatory Visit: Payer: Self-pay | Admitting: Cardiology

## 2022-10-21 DIAGNOSIS — G4733 Obstructive sleep apnea (adult) (pediatric): Secondary | ICD-10-CM | POA: Diagnosis not present

## 2022-11-03 ENCOUNTER — Other Ambulatory Visit: Payer: Self-pay | Admitting: Internal Medicine

## 2022-11-20 DIAGNOSIS — G4733 Obstructive sleep apnea (adult) (pediatric): Secondary | ICD-10-CM | POA: Diagnosis not present

## 2022-11-29 ENCOUNTER — Telehealth: Payer: Self-pay | Admitting: Cardiology

## 2022-11-29 MED ORDER — FUROSEMIDE 20 MG PO TABS: 20.0000 mg | ORAL_TABLET | Freq: Every day | ORAL | 8 refills | Status: AC | PRN

## 2022-11-29 NOTE — Telephone Encounter (Signed)
Pt's medication was sent to pt's pharmacy as requested. Confirmation received.  °

## 2022-11-29 NOTE — Telephone Encounter (Signed)
*  STAT* If patient is at the pharmacy, call can be transferred to refill team.   1. Which medications need to be refilled? (please list name of each medication and dose if known)   furosemide (LASIX) 20 MG tablet     4. Which pharmacy/location (including street and city if local pharmacy) is medication to be sent to? [id number] - [place], [place] - [address]     5. Do they need a 30 day or 90 day supply? 30   Pt states she doesn't use this often but would like to have some just in case.

## 2022-12-10 ENCOUNTER — Encounter: Payer: Self-pay | Admitting: Internal Medicine

## 2022-12-11 ENCOUNTER — Telehealth (INDEPENDENT_AMBULATORY_CARE_PROVIDER_SITE_OTHER): Payer: Medicare HMO | Admitting: Family Medicine

## 2022-12-11 ENCOUNTER — Other Ambulatory Visit: Payer: Self-pay | Admitting: Internal Medicine

## 2022-12-11 ENCOUNTER — Encounter: Payer: Self-pay | Admitting: Family Medicine

## 2022-12-11 DIAGNOSIS — U071 COVID-19: Secondary | ICD-10-CM | POA: Diagnosis not present

## 2022-12-11 DIAGNOSIS — E039 Hypothyroidism, unspecified: Secondary | ICD-10-CM

## 2022-12-11 MED ORDER — NIRMATRELVIR/RITONAVIR (PAXLOVID)TABLET: 3.0000 | ORAL_TABLET | Freq: Two times a day (BID) | ORAL | 0 refills | Status: AC

## 2022-12-11 NOTE — Progress Notes (Signed)
Subjective:    Patient ID: Shelly Bowers, female    DOB: 1951/03/07, 72 y.o.   MRN: [id number]  HPI Virtual Visit via Video Note  I connected with the patient on 12/11/22 at  8:15 AM EDT by a video enabled telemedicine application and verified that I am speaking with the correct person using two identifiers.  Location patient: home Location provider:work or home office Persons participating in the virtual visit: patient, provider  I discussed the limitations of evaluation and management by telemedicine and the availability of in person appointments. The patient expressed understanding and agreed to proceed.   HPI: Here for 3 days of sinus congestion, PND, dry cough, and body aches. No fever or SOB. She tested positive for Covid last night. Her husband has been treated for a Covid infection as well in the past week. She has been [facility] and [facility].    ROS: See pertinent positives and negatives per HPI.  Past Medical History:  Diagnosis Date   Atrial fibrillation (HCC)    Cardiomyopathy (HCC)    HTN (hypertension)    Hyperlipidemia    Osteopenia    Presence of Watchman left atrial appendage closure device 07/12/2022   31mm Watchman FLX with Dr. Lalla Brothers    Past Surgical History:  Procedure Laterality Date   ATRIAL FIBRILLATION ABLATION N/A 11/21/2020   Procedure: ATRIAL FIBRILLATION ABLATION;  Surgeon: Lanier Prude, MD;  Location: [facility];  Service: Cardiovascular;  Laterality: N/A;   AUGMENTATION MAMMAPLASTY Bilateral    BREAST EXCISIONAL BIOPSY Left    BUBBLE STUDY  08/25/2020   Procedure: BUBBLE STUDY;  Surgeon: Christell Constant, MD;  Location: [facility];  Service: Cardiovascular;;   CARDIOVERSION N/A 08/25/2020   Procedure: CARDIOVERSION;  Surgeon: Christell Constant, MD;  Location: [facility];  Service: Cardiovascular;  Laterality: N/A;   CARDIOVERSION N/A 12/26/2020   Procedure: CARDIOVERSION;  Surgeon: Christell Constant,  MD;  Location: [facility];  Service: Cardiovascular;  Laterality: N/A;   INCISION AND DRAINAGE ABSCESS Left 12/27/2015   Procedure: INCISION AND DRAINAGE ABSCESS;  Surgeon: Cindee Salt, MD;  Location: [facility];  Service: Orthopedics;  Laterality: Left;   LEFT ATRIAL APPENDAGE OCCLUSION N/A 07/12/2022   Procedure: LEFT ATRIAL APPENDAGE OCCLUSION;  Surgeon: Lanier Prude, MD;  Location: [facility];  Service: Cardiovascular;  Laterality: N/A;   TEE WITHOUT CARDIOVERSION N/A 08/25/2020   Procedure: TRANSESOPHAGEAL ECHOCARDIOGRAM (TEE);  Surgeon: Christell Constant, MD;  Location: [facility];  Service: Cardiovascular;  Laterality: N/A;   TEE WITHOUT CARDIOVERSION N/A 07/12/2022   Procedure: TRANSESOPHAGEAL ECHOCARDIOGRAM;  Surgeon: Lanier Prude, MD;  Location: [facility];  Service: Cardiovascular;  Laterality: N/A;    Family History  Problem Relation Age of Onset   Breast cancer Mother 37   Atrial fibrillation Father      Current Outpatient Medications:    amoxicillin (AMOXIL) 500 MG tablet, Take 4 tablets (2,000 mg total) by mouth as directed. 1 HOUR PRIOR TO DENTAL APPOINTMENTS, Disp: 12 tablet, Rfl: 6   atorvastatin (LIPITOR) 20 MG tablet, TAKE 1 TABLET BY MOUTH EVERY DAY, Disp: 90 tablet, Rfl: 1   CALCIUM-VITAMIN D PO, Take 1 tablet by mouth daily., Disp: , Rfl:    celecoxib (CELEBREX) 200 MG capsule, TAKE 1 CAPSULE BY MOUTH 2 TIMES DAILY AS NEEDED (Patient taking differently: Take 200 mg by mouth 2 (two) times daily as needed for moderate pain.), Disp: 90 capsule, Rfl: 1  clopidogrel (PLAVIX) 75 MG tablet, Take 1 tablet (75 mg total) by mouth daily. START ON 6/3, Disp: 90 tablet, Rfl: 3   cyanocobalamin (VITAMIN B12) 1000 MCG tablet, Take 1,000 mcg by mouth daily., Disp: , Rfl:    fluticasone (FLONASE) 50 MCG/ACT nasal spray, Place 1-2 sprays into both nostrils daily as needed for allergies or rhinitis., Disp: , Rfl:    folic acid (FOLVITE)  800 MCG tablet, Take 800 mcg by mouth daily., Disp: , Rfl:    furosemide (LASIX) 20 MG tablet, Take 1 tablet (20 mg total) by mouth daily as needed for fluid., Disp: 30 tablet, Rfl: 8   ibuprofen (ADVIL,MOTRIN) 200 MG tablet, Take 400 mg by mouth every 6 (six) hours as needed for headache, fever or mild pain., Disp: , Rfl:    levothyroxine (SYNTHROID) 50 MCG tablet, Take 1 tablet (50 mcg total) by mouth daily before breakfast., Disp: 90 tablet, Rfl: 1   metoprolol succinate (TOPROL-XL) 25 MG 24 hr tablet, TAKE 1/2 TABLET BY MOUTH EVERY DAY, Disp: 45 tablet, Rfl: 1   metroNIDAZOLE (METROGEL) 0.75 % gel, Apply 1 application topically at bedtime., Disp: , Rfl:    naphazoline-pheniramine (VISINE) 0.025-0.3 % ophthalmic solution, Place 1 drop into both eyes as needed for eye irritation., Disp: , Rfl:    sacubitril-valsartan (ENTRESTO) 24-26 MG, TAKE 1 TABLET BY MOUTH TWICE A DAY, Disp: 180 tablet, Rfl: 0   vitamin E 180 MG (400 UNITS) capsule, Take 400 Units by mouth daily., Disp: , Rfl:   EXAM:  VITALS per patient if applicable:  GENERAL: alert, oriented, appears well and in no acute distress  HEENT: atraumatic, conjunttiva clear, no obvious abnormalities on inspection of external nose and ears  NECK: normal movements of the head and neck  LUNGS: on inspection no signs of respiratory distress, breathing rate appears normal, no obvious gross SOB, gasping or wheezing  CV: no obvious cyanosis  MS: moves all visible extremities without noticeable abnormality  PSYCH/NEURO: pleasant and cooperative, no obvious depression or anxiety, speech and thought processing grossly intact  ASSESSMENT AND PLAN: Covid infection, treat with 5 days of Paxlovid. Follow up as needed.  Gershon Crane, MD  Discussed the following assessment and plan:  No diagnosis found.     I discussed the assessment and treatment plan with the patient. The patient was provided an opportunity to ask questions and all were  answered. The patient agreed with the plan and demonstrated an understanding of the instructions.   The patient was advised to call back or seek an in-person evaluation if the symptoms worsen or if the condition fails to improve as anticipated.      Review of Systems     Objective:   Physical Exam        Assessment & Plan:

## 2022-12-21 DIAGNOSIS — G4733 Obstructive sleep apnea (adult) (pediatric): Secondary | ICD-10-CM | POA: Diagnosis not present

## 2022-12-22 ENCOUNTER — Other Ambulatory Visit: Payer: Self-pay | Admitting: Cardiology

## 2022-12-22 DIAGNOSIS — I4819 Other persistent atrial fibrillation: Secondary | ICD-10-CM

## 2023-01-01 DIAGNOSIS — H5203 Hypermetropia, bilateral: Secondary | ICD-10-CM | POA: Diagnosis not present

## 2023-01-01 DIAGNOSIS — H43813 Vitreous degeneration, bilateral: Secondary | ICD-10-CM | POA: Diagnosis not present

## 2023-01-01 DIAGNOSIS — H35363 Drusen (degenerative) of macula, bilateral: Secondary | ICD-10-CM | POA: Diagnosis not present

## 2023-01-01 DIAGNOSIS — H25013 Cortical age-related cataract, bilateral: Secondary | ICD-10-CM | POA: Diagnosis not present

## 2023-01-01 DIAGNOSIS — H524 Presbyopia: Secondary | ICD-10-CM | POA: Diagnosis not present

## 2023-01-01 DIAGNOSIS — H2513 Age-related nuclear cataract, bilateral: Secondary | ICD-10-CM | POA: Diagnosis not present

## 2023-01-01 DIAGNOSIS — H52203 Unspecified astigmatism, bilateral: Secondary | ICD-10-CM | POA: Diagnosis not present

## 2023-01-03 DIAGNOSIS — Z0100 Encounter for examination of eyes and vision without abnormal findings: Secondary | ICD-10-CM | POA: Diagnosis not present

## 2023-01-04 NOTE — Progress Notes (Signed)
[facility]                                     Cardiology Office Note:    Date:  01/08/2023   ID:  Shelly Bowers, DOB Jul 08, 1950, MRN [id number]  PCP:  Philip Aspen, Limmie Lavetta, MD  [facility] Cardiologist:  Rollene Rotunda, MD  [facility] Electrophysiologist:  Lanier Prude, MD   Referring MD: Philip Aspen, Estel*   Chief Complaint  Patient presents with   6 month s/p LAAO    History of Present Illness:    Shelly Bowers is a 72 y.o. female with a hx of cardiomyopathy with EF normalization per last echo, HLD, HTN, and atrial fibrillation s/p AF ablation who is now s/p LAAO closure 4/18 with Dr. Lalla Brothers and is here today for 6 month follow up.    Shelly Bowers was admitted and underwent left atrial appendage occlusive device placement with 31mm Watchman FLX device. She tolerated the procedure well and was discharged same day home. She was restarted on Eliquis and subsequently transitioned to Plavix for 6 months post implant. Post implant CT 6/18 with no device leak or thrombus.    Today she reports that she has been well with no change since she was last seen. She denies SOB, chest pain, palpitations, LE edema, orthopnea, bleeding in stool or urine, dizziness, or syncope.   Past Medical History:  Diagnosis Date   Atrial fibrillation (HCC)    Cardiomyopathy (HCC)    HTN (hypertension)    Hyperlipidemia    Osteopenia    Presence of Watchman left atrial appendage closure device 07/12/2022   31mm Watchman FLX with Dr. Lalla Brothers   Past Surgical History:  Procedure Laterality Date   ATRIAL FIBRILLATION ABLATION N/A 11/21/2020   Procedure: ATRIAL FIBRILLATION ABLATION;  Surgeon: Lanier Prude, MD;  Location: [facility];  Service: Cardiovascular;  Laterality: N/A;   AUGMENTATION MAMMAPLASTY Bilateral    BREAST EXCISIONAL BIOPSY Left    BUBBLE STUDY  08/25/2020   Procedure: BUBBLE STUDY;  Surgeon: Christell Constant, MD;   Location: [facility];  Service: Cardiovascular;;   CARDIOVERSION N/A 08/25/2020   Procedure: CARDIOVERSION;  Surgeon: Christell Constant, MD;  Location: [facility];  Service: Cardiovascular;  Laterality: N/A;   CARDIOVERSION N/A 12/26/2020   Procedure: CARDIOVERSION;  Surgeon: Christell Constant, MD;  Location: [facility];  Service: Cardiovascular;  Laterality: N/A;   INCISION AND DRAINAGE ABSCESS Left 12/27/2015   Procedure: INCISION AND DRAINAGE ABSCESS;  Surgeon: Cindee Salt, MD;  Location: [facility];  Service: Orthopedics;  Laterality: Left;   LEFT ATRIAL APPENDAGE OCCLUSION N/A 07/12/2022   Procedure: LEFT ATRIAL APPENDAGE OCCLUSION;  Surgeon: Lanier Prude, MD;  Location: [facility];  Service: Cardiovascular;  Laterality: N/A;   TEE WITHOUT CARDIOVERSION N/A 08/25/2020   Procedure: TRANSESOPHAGEAL ECHOCARDIOGRAM (TEE);  Surgeon: Christell Constant, MD;  Location: [facility] ENDOSCOPY;  Service: Cardiovascular;  Laterality: N/A;   TEE WITHOUT CARDIOVERSION N/A 07/12/2022   Procedure: TRANSESOPHAGEAL ECHOCARDIOGRAM;  Surgeon: Lanier Prude, MD;  Location: [facility] INVASIVE CV LAB;  Service: Cardiovascular;  Laterality: N/A;    Current Medications: Current Meds  Medication Sig   atorvastatin (LIPITOR) 20 MG tablet TAKE 1 TABLET BY MOUTH EVERY DAY   CALCIUM-VITAMIN D PO Take 1 tablet by mouth daily.   celecoxib (CELEBREX) 200 MG capsule TAKE  1 CAPSULE BY MOUTH 2 TIMES DAILY AS NEEDED (Patient taking differently: Take 200 mg by mouth 2 (two) times daily as needed for moderate pain.)   cyanocobalamin (VITAMIN B12) 1000 MCG tablet Take 1,000 mcg by mouth daily.   fluticasone (FLONASE) 50 MCG/ACT nasal spray Place 1-2 sprays into both nostrils daily as needed for allergies or rhinitis.   folic acid (FOLVITE) 800 MCG tablet Take 800 mcg by mouth daily.   furosemide (LASIX) 20 MG tablet Take 1 tablet (20 mg total) by mouth daily as needed for fluid.   ibuprofen  (ADVIL,MOTRIN) 200 MG tablet Take 400 mg by mouth every 6 (six) hours as needed for headache, fever or mild pain.   levothyroxine (SYNTHROID) 50 MCG tablet TAKE 1 TABLET BY MOUTH DAILY BEFORE BREAKFAST   metoprolol succinate (TOPROL-XL) 25 MG 24 hr tablet TAKE 1/2 TABLET BY MOUTH DAILY   metroNIDAZOLE (METROGEL) 0.75 % gel Apply 1 application topically at bedtime.   naphazoline-pheniramine (VISINE) 0.025-0.3 % ophthalmic solution Place 1 drop into both eyes as needed for eye irritation.   sacubitril-valsartan (ENTRESTO) 24-26 MG TAKE 1 TABLET BY MOUTH TWICE A DAY   vitamin E 180 MG (400 UNITS) capsule Take 400 Units by mouth daily.   [DISCONTINUED] amoxicillin (AMOXIL) 500 MG tablet Take 4 tablets (2,000 mg total) by mouth as directed. 1 HOUR PRIOR TO DENTAL APPOINTMENTS   [DISCONTINUED] clopidogrel (PLAVIX) 75 MG tablet Take 1 tablet (75 mg total) by mouth daily. START ON 6/3     Allergies:   Codeine and Hydrocodone   Social History   Socioeconomic History   Marital status: Married    Spouse name: Not on file   Number of children: Not on file   Years of education: Not on file   Highest education level: Not on file  Occupational History   Not on file  Tobacco Use   Smoking status: Never   Smokeless tobacco: Never  Substance and Sexual Activity   Alcohol use: Yes    Alcohol/week: 14.0 standard drinks of alcohol    Types: 14 Glasses of wine per week    Comment: 1 glass of wine nightly   Drug use: No   Sexual activity: Not on file  Other Topics Concern   Not on file  Social History Narrative   Left handed   Caffeine use: tea sometimes   Soda-zero sugar/no caffeine ginger ale   1-2 cups coffee per day (decaf)   Social Determinants of Health   Financial Resource Strain: Not on file  Food Insecurity: No Food Insecurity (08/14/2022)   Hunger Vital Sign    Worried About Running Out of Food in the Last Year: Never true    Ran Out of Food in the Last Year: Never true   Transportation Needs: Not on file  Physical Activity: Not on file  Stress: Not on file  Social Connections: Not on file     Family History: The patient's family history includes Atrial fibrillation in her father; Breast cancer (age of onset: 81) in her mother.  ROS:   Please see the history of present illness.    All other systems reviewed and are negative.  EKGs/Labs/Other Studies Reviewed:    The following studies were reviewed today:   Cardiac Studies & Procedures       ECHOCARDIOGRAM  ECHOCARDIOGRAM COMPLETE 06/08/2021  Narrative ECHOCARDIOGRAM REPORT    Patient Name:   SANDI TOWE Date of Exam: 06/08/2021 Medical Rec #:  [id number]  Height:       66.0 in Accession #:    [id number]          Weight:       184.6 lb Date of Birth:  1950/04/03           BSA:          1.933 m Patient Age:    71 years            BP:           126/74 mmHg Patient Gender: F                   HR:           53 bpm. Exam Location:  [address]  Procedure: 2D Echo, 3D Echo, Cardiac Doppler and Color Doppler  Indications:    Cardiomyopathy I42.9  History:        Patient has prior history of Echocardiogram examinations, most recent 08/25/2020. Atrial Fibrillation Ablation 2022, Arrythmias:Atrial Fibrillation; Risk Factors:Hypertension and Dyslipidemia.  Sonographer:    Thurman Coyer RDCS Referring Phys: [id number] RENEE LYNN URSUY  IMPRESSIONS   1. Left ventricular ejection fraction, by estimation, is 55 to 60%. The left ventricle has normal function. The left ventricle has no regional wall motion abnormalities. Left ventricular diastolic parameters are consistent with Grade II diastolic dysfunction (pseudonormalization). 2. Right ventricular systolic function is normal. The right ventricular size is normal. There is mildly elevated pulmonary artery systolic pressure. 3. Left atrial size was moderately dilated. 4. Right atrial size was mildly dilated. 5. The mitral  valve is grossly normal. Mild mitral valve regurgitation. No evidence of mitral stenosis. 6. The aortic valve is grossly normal. Aortic valve regurgitation is mild. No aortic stenosis is present. 7. The inferior vena cava is dilated in size with >50% respiratory variability, suggesting right atrial pressure of 8 mmHg.  Comparison(s): Changes from prior study are noted. EF has normalized on current study.  Conclusion(s)/Recommendation(s): Otherwise normal echocardiogram, with minor abnormalities described in the report.  FINDINGS Left Ventricle: Left ventricular ejection fraction, by estimation, is 55 to 60%. The left ventricle has normal function. The left ventricle has no regional wall motion abnormalities. The left ventricular internal cavity size was normal in size. There is no left ventricular hypertrophy. Left ventricular diastolic parameters are consistent with Grade II diastolic dysfunction (pseudonormalization).  Right Ventricle: The right ventricular size is normal. No increase in right ventricular wall thickness. Right ventricular systolic function is normal. There is mildly elevated pulmonary artery systolic pressure. The tricuspid regurgitant velocity is 2.82 m/s, and with an assumed right atrial pressure of 8 mmHg, the estimated right ventricular systolic pressure is 39.8 mmHg.  Left Atrium: Left atrial size was moderately dilated.  Right Atrium: Right atrial size was mildly dilated.  Pericardium: There is no evidence of pericardial effusion.  Mitral Valve: The mitral valve is grossly normal. Mild to moderate mitral annular calcification. Mild mitral valve regurgitation. No evidence of mitral valve stenosis.  Tricuspid Valve: The tricuspid valve is normal in structure. Tricuspid valve regurgitation is trivial. No evidence of tricuspid stenosis.  Aortic Valve: The aortic valve is grossly normal. Aortic valve regurgitation is mild. Aortic regurgitation PHT measures 489 msec. No  aortic stenosis is present.  Pulmonic Valve: The pulmonic valve was not well visualized. Pulmonic valve regurgitation is not visualized. No evidence of pulmonic stenosis.  Aorta: The aortic root, ascending aorta, aortic arch and descending aorta are all structurally normal, with no evidence of  dilitation or obstruction.  Venous: The inferior vena cava is dilated in size with greater than 50% respiratory variability, suggesting right atrial pressure of 8 mmHg.  IAS/Shunts: The atrial septum is grossly normal.   LEFT VENTRICLE PLAX 2D LVIDd:         5.20 cm     Diastology LVIDs:         3.10 cm     LV e' medial:    5.07 cm/s LV PW:         0.90 cm     LV E/e' medial:  17.3 LV IVS:        0.80 cm     LV e' lateral:   10.40 cm/s LVOT diam:     1.90 cm     LV E/e' lateral: 8.4 LV SV:         58 LV SV Index:   30 LVOT Area:     2.84 cm  3D Volume EF: LV Volumes (MOD)           3D EF:        57 % LV vol d, MOD A2C: 80.2 ml LV EDV:       161 ml LV vol d, MOD A4C: 98.4 ml LV ESV:       69 ml LV vol s, MOD A2C: 33.3 ml LV SV:        92 ml LV vol s, MOD A4C: 40.5 ml LV SV MOD A2C:     46.9 ml LV SV MOD A4C:     98.4 ml LV SV MOD BP:      53.4 ml  RIGHT VENTRICLE RV S prime:     13.20 cm/s TAPSE (M-mode): 2.0 cm  LEFT ATRIUM             Index        RIGHT ATRIUM           Index LA diam:        4.40 cm 2.28 cm/m   RA Area:     18.20 cm LA Vol (A2C):   92.2 ml 47.70 ml/m  RA Volume:   50.60 ml  26.18 ml/m LA Vol (A4C):   85.5 ml 44.24 ml/m LA Biplane Vol: 89.1 ml 46.10 ml/m AORTIC VALVE LVOT Vmax:   81.70 cm/s LVOT Vmean:  52.500 cm/s LVOT VTI:    0.205 m AI PHT:      489 msec  AORTA Ao Root diam: 2.30 cm  MITRAL VALVE               TRICUSPID VALVE MV Area (PHT): 2.56 cm    TR Peak grad:   31.8 mmHg MV Decel Time: 296 msec    TR Vmax:        282.00 cm/s MR Peak grad: 103.6 mmHg MR Mean grad: 70.0 mmHg    SHUNTS MR Vmax:      509.00 cm/s  Systemic VTI:  0.20 m MR  Vmean:     400.0 cm/s   Systemic Diam: 1.90 cm MV E velocity: 87.50 cm/s MV A velocity: 46.70 cm/s MV E/A ratio:  1.87  Jodelle Red MD Electronically signed by Jodelle Red MD Signature Date/Time: 06/10/2021/9:22:59 PM    Final   TEE  ECHO TEE 07/12/2022  Narrative TRANSESOPHOGEAL ECHO REPORT    Patient Name:   NAYELLIE SANSEVERINO Date of Exam: 07/12/2022 Medical Rec #:  [id number]           Height:  65.0 in Accession #:    [id number]          Weight:       189.0 lb Date of Birth:  Apr 20, 1950           BSA:          1.931 m Patient Age:    72 years            BP:           144/76 mmHg Patient Gender: F                   HR:           57 bpm. Exam Location:  Inpatient  Procedure: Transesophageal Echo, 3D Echo, Color Doppler and Cardiac Doppler  Indications:     Watchman placement  History:         Patient has prior history of Echocardiogram examinations, most recent 06/08/2021. CHF and Cardiomyopathy, Arrythmias:Atrial Fibrillation; Risk Factors:Hypertension, Sleep Apnea and Dyslipidemia.  Sonographer:     Milbert Coulter Referring Phys:  [id number] Lanier Prude Diagnosing Phys: Riley Lam MD  PROCEDURE: After discussion of the risks and benefits of a TEE, an informed consent was obtained from the patient. The patient was intubated. The transesophogeal probe was passed without difficulty through the esophogus of the patient. Imaged were obtained with the patient in a supine position. Sedation performed by different physician. The patient was monitored while under deep sedation. Anesthestetic sedation was provided intravenously by Anesthesiology: 150mg  of Propofol, 80mg  of Lidocaine. Image quality was good. The patient's vital signs; including heart rate, blood pressure, and oxygen saturation; remained stable throughout the procedure. The patient developed no complications during the procedure.  IMPRESSIONS   1. Patient left atrial  appendage. Prior to procedure, maximal, prehyrdation ostial dimension 23 mm. Maximal wall distance 19 mm. A transeptal puncture was performed away from patients PFO. A 31 mm Watchman FLX PRO device was placed. There was a 1/3-1/2 mitral shoulder noted (at worst) with associated gutter. There was no peri-device leak. Average compression ~ 23%. No thrombus. Residual left to right shunt. No pericardial effusion. Left atrial size was moderately dilated. No left atrial/left atrial appendage thrombus was detected. 2. Left ventricular ejection fraction, by estimation, is 55 to 60%. The left ventricle has normal function. 3. Right ventricular systolic function is normal. The right ventricular size is normal. 4. Right atrial size was mildly dilated. 5. The mitral valve is myxomatous. Mild to moderate mitral valve regurgitation. No evidence of mitral stenosis. 6. The tricuspid valve is abnormal. Tricuspid valve regurgitation is mild to moderate. 7. The aortic valve is abnormal. Aortic valve regurgitation is mild. No aortic stenosis is present. 8. Evidence of atrial level shunting detected by color flow Doppler.  FINDINGS Left Ventricle: Left ventricular ejection fraction, by estimation, is 55 to 60%. The left ventricle has normal function. The left ventricular internal cavity size was normal in size.  Right Ventricle: The right ventricular size is normal. Right vetricular wall thickness was not well visualized. Right ventricular systolic function is normal.  Left Atrium: Patient left atrial appendage. Prior to procedure, maximal, prehyrdation ostial dimension 23 mm. Maximal wall distance 19 mm. A transeptal puncture was performed away from patients PFO. A 31 mm Watchman FLX PRO device was placed. There was a 1/3-1/2 mitral shoulder noted (at worst) with associated gutter. There was no peri-device leak. Average compression ~ 23%. No thrombus. Residual left to right shunt. No pericardial effusion. Left atrial  size was moderately dilated. No left atrial/left atrial appendage thrombus was detected.  Right Atrium: Right atrial size was mildly dilated.  Pericardium: There is no evidence of pericardial effusion.  Mitral Valve: The mitral valve is myxomatous. Mild to moderate mitral valve regurgitation. No evidence of mitral valve stenosis.  Tricuspid Valve: The tricuspid valve is abnormal. Tricuspid valve regurgitation is mild to moderate.  Aortic Valve: The aortic valve is abnormal. Aortic valve regurgitation is mild. No aortic stenosis is present.  Pulmonic Valve: The pulmonic valve was normal in structure. Pulmonic valve regurgitation is trivial.  Aorta: The aortic root and ascending aorta are structurally normal, with no evidence of dilitation.  IAS/Shunts: Evidence of atrial level shunting detected by color flow Doppler.  Additional Comments: Spectral Doppler performed.  Riley Lam MD Electronically signed by Riley Lam MD Signature Date/Time: 07/12/2022/5:40:05 PM    Final             EKG:  EKG is not ordered today.    Recent Labs: 04/11/2022: ALT 12; TSH 1.96 06/25/2022: BUN 14; Creatinine, Ser 0.82; Hemoglobin 11.8; Platelets 197; Potassium 5.2; Sodium 141   Recent Lipid Panel    Component Value Date/Time   CHOL 195 10/10/2022 1025   TRIG 110.0 10/10/2022 1025   HDL 62.40 10/10/2022 1025   CHOLHDL 3 10/10/2022 1025   VLDL 22.0 10/10/2022 1025   LDLCALC 111 (H) 10/10/2022 1025   LDLCALC 125 (H) 01/22/2020 1115   LDLDIRECT 191.9 04/14/2013 0856    Risk Assessment/Calculations:    CHA2DS2-VASc Score = 4 [CHF History: 1, HTN History: 1, Diabetes History: 0, Stroke History: 0, Vascular Disease History: 0, Age Score: 1, Gender Score: 1].  Therefore, the patient's annual risk of stroke is 4.8 %.      Physical Exam:    VS:  BP (!) 140/72   Pulse (!) 53   Ht 5\' 6"  (1.676 m)   Wt 196 lb 9.6 oz (89.2 kg)   SpO2 95%   BMI 31.73 kg/m     Wt Readings  from Last 3 Encounters:  01/07/23 196 lb 9.6 oz (89.2 kg)  10/10/22 190 lb 6.4 oz (86.4 kg)  08/15/22 193 lb 3.2 oz (87.6 kg)    General: Well developed, well nourished, NAD Lungs:Clear to ausculation bilaterally. No wheezes, rales, or rhonchi. Breathing is unlabored. Cardiovascular: RRR with S1 S2. No murmurs Extremities: No edema.  Neuro: Alert and oriented. No focal deficits. No facial asymmetry. MAE spontaneously. Psych: Responds to questions appropriately with normal affect.    ASSESSMENT/PLAN:    Persistent atrial fibrillation: s/p AF ablation and is now s/p LAAO closure with 31mm Watchman FLX device on 07/12/22. Post implant CT with no device leak or thrombus. She is now 6 months post LAAO and may stop Plavix with no further need for dental SBE. This was discussed today and medication list has ben update. Plan to touch base with her at 1 year post implant, otherwise continue regular follow up with AF clinic.    HFrEF/NICM: Patient doing well with NYHA class I symptoms. EF normalized on last echocardiogram. Continue current regimen.    HTN: Stable with no changes needed at this time.   Medication Adjustments/Labs and Tests Ordered: Current medicines are reviewed at length with the patient today.  Concerns regarding medicines are outlined above.  No orders of the defined types were placed in this encounter.  No orders of the defined types were placed in this encounter.   Patient Instructions  Medication Instructions:  Your physician has recommended you make the following change in your medication:  1-STOP Plavix 2-STOP amoxicillin  *If you need a refill on your cardiac medications before your next appointment, please call your pharmacy*  Lab Work: If you have labs (blood work) drawn today and your tests are completely normal, you will receive your results only by: MyChart Message (if you have MyChart) OR A paper copy in the mail If you have any lab test that is abnormal or  we need to change your treatment, we will call you to review the results.  Follow-Up: At [facility], you and your health needs are our priority.  As part of our continuing mission to provide you with exceptional heart care, we have created designated Provider Care Teams.  These Care Teams include your primary Cardiologist (physician) and Advanced Practice Providers (APPs -  Physician Assistants and Nurse Practitioners) who all work together to provide you with the care you need, when you need it.  We recommend signing up for the patient portal called "MyChart".  Sign up information is provided on this After Visit Summary.  MyChart is used to connect with patients for Virtual Visits (Telemedicine).  Patients are able to view lab/test results, encounter notes, upcoming appointments, etc.  Non-urgent messages can be sent to your provider as well.   To learn more about what you can do with MyChart, go to [email or web address].    Your next appointment:   Our team will call you  Provider:   Georgie Chard, NP      Signed, Georgie Chard, NP  01/08/2023 8:37 AM    [facility]

## 2023-01-07 ENCOUNTER — Ambulatory Visit: Payer: Medicare HMO | Attending: Cardiology | Admitting: Cardiology

## 2023-01-07 VITALS — BP 140/72 | HR 53 | Ht 66.0 in | Wt 196.6 lb

## 2023-01-07 DIAGNOSIS — I429 Cardiomyopathy, unspecified: Secondary | ICD-10-CM

## 2023-01-07 DIAGNOSIS — Z95818 Presence of other cardiac implants and grafts: Secondary | ICD-10-CM

## 2023-01-07 DIAGNOSIS — I4819 Other persistent atrial fibrillation: Secondary | ICD-10-CM

## 2023-01-07 DIAGNOSIS — I10 Essential (primary) hypertension: Secondary | ICD-10-CM

## 2023-01-07 NOTE — Patient Instructions (Addendum)
Medication Instructions:  Your physician has recommended you make the following change in your medication:  1-STOP Plavix 2-STOP amoxicillin  *If you need a refill on your cardiac medications before your next appointment, please call your pharmacy*  Lab Work: If you have labs (blood work) drawn today and your tests are completely normal, you will receive your results only by: MyChart Message (if you have MyChart) OR A paper copy in the mail If you have any lab test that is abnormal or we need to change your treatment, we will call you to review the results.  Follow-Up: At [facility], you and your health needs are our priority.  As part of our continuing mission to provide you with exceptional heart care, we have created designated Provider Care Teams.  These Care Teams include your primary Cardiologist (physician) and Advanced Practice Providers (APPs -  Physician Assistants and Nurse Practitioners) who all work together to provide you with the care you need, when you need it.  We recommend signing up for the patient portal called "MyChart".  Sign up information is provided on this After Visit Summary.  MyChart is used to connect with patients for Virtual Visits (Telemedicine).  Patients are able to view lab/test results, encounter notes, upcoming appointments, etc.  Non-urgent messages can be sent to your provider as well.   To learn more about what you can do with MyChart, go to [email or web address].    Your next appointment:   Our team will call you  Provider:   Georgie Chard, NP

## 2023-01-13 ENCOUNTER — Other Ambulatory Visit: Payer: Self-pay | Admitting: Cardiology

## 2023-01-20 DIAGNOSIS — G4733 Obstructive sleep apnea (adult) (pediatric): Secondary | ICD-10-CM | POA: Diagnosis not present

## 2023-01-29 DIAGNOSIS — R69 Illness, unspecified: Secondary | ICD-10-CM | POA: Diagnosis not present

## 2023-02-19 DIAGNOSIS — G4733 Obstructive sleep apnea (adult) (pediatric): Secondary | ICD-10-CM | POA: Diagnosis not present

## 2023-03-21 DIAGNOSIS — G4733 Obstructive sleep apnea (adult) (pediatric): Secondary | ICD-10-CM | POA: Diagnosis not present

## 2023-04-03 IMAGING — MG DIGITAL SCREENING BREAST BILAT IMPLANT W/ TOMO W/ CAD
8 of 12 series · 8 of 28 positions shown · non-contrast
Comparison: Previous exam(s).

CLINICAL DATA: Screening.

EXAM:
DIGITAL SCREENING BILATERAL MAMMOGRAM WITH IMPLANTS, CAD AND
TOMOSYNTHESIS
TECHNIQUE: Bilateral screening digital craniocaudal and mediolateral oblique
mammograms were obtained. Bilateral screening digital breast
tomosynthesis was performed. The images were evaluated with
computer-aided detection. Standard and/or implant displaced views
were performed.

[L MLO]
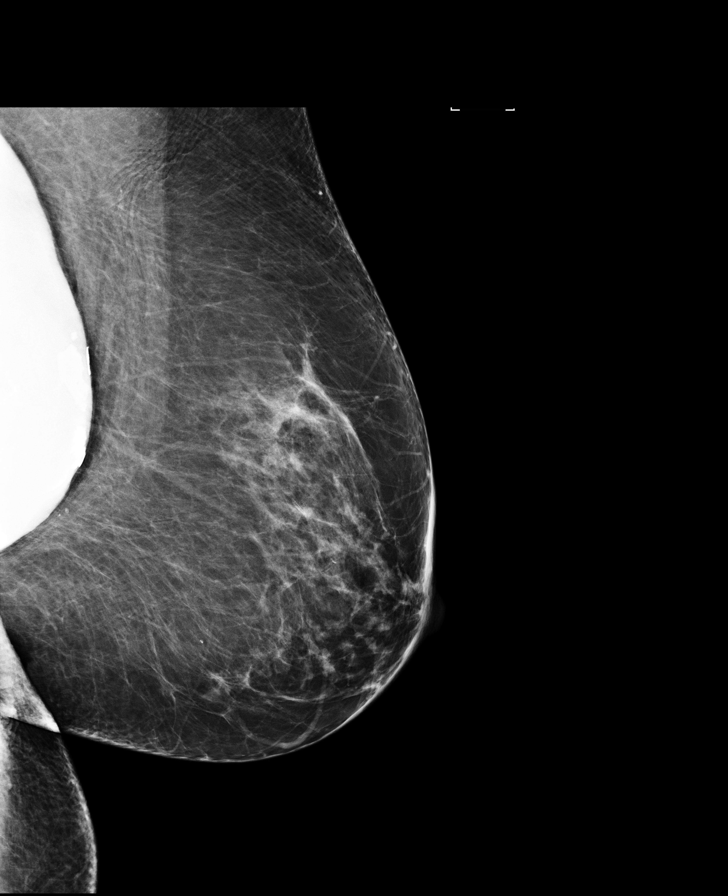

[R CC]
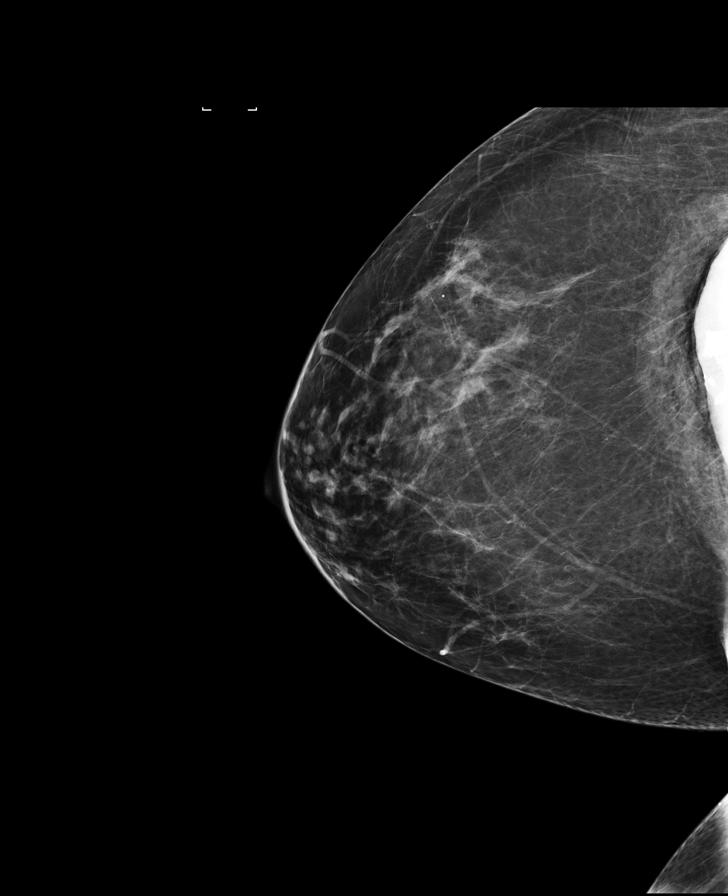

[R MLO]
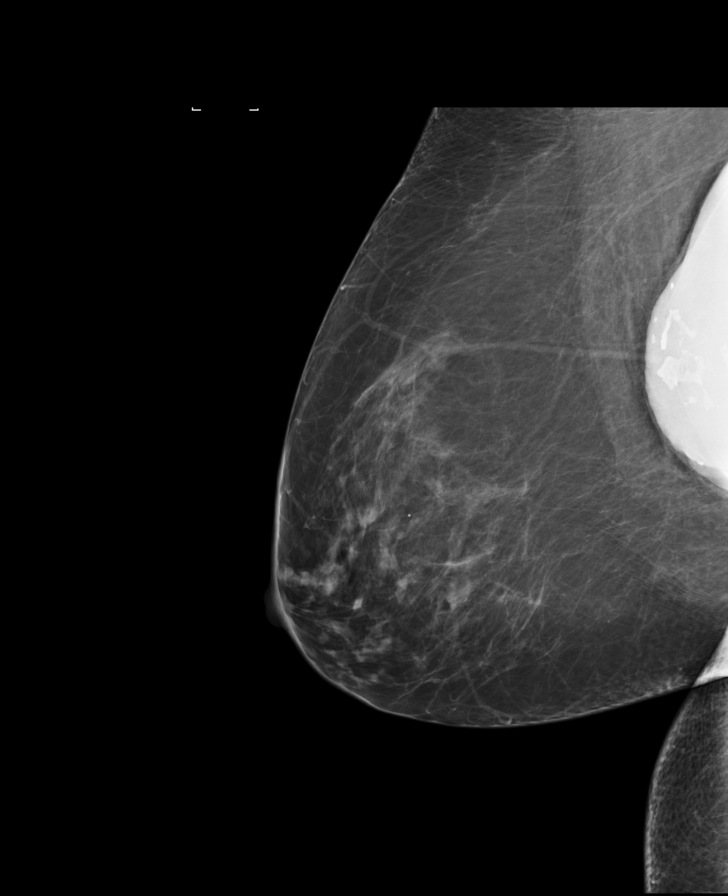

[L CC]
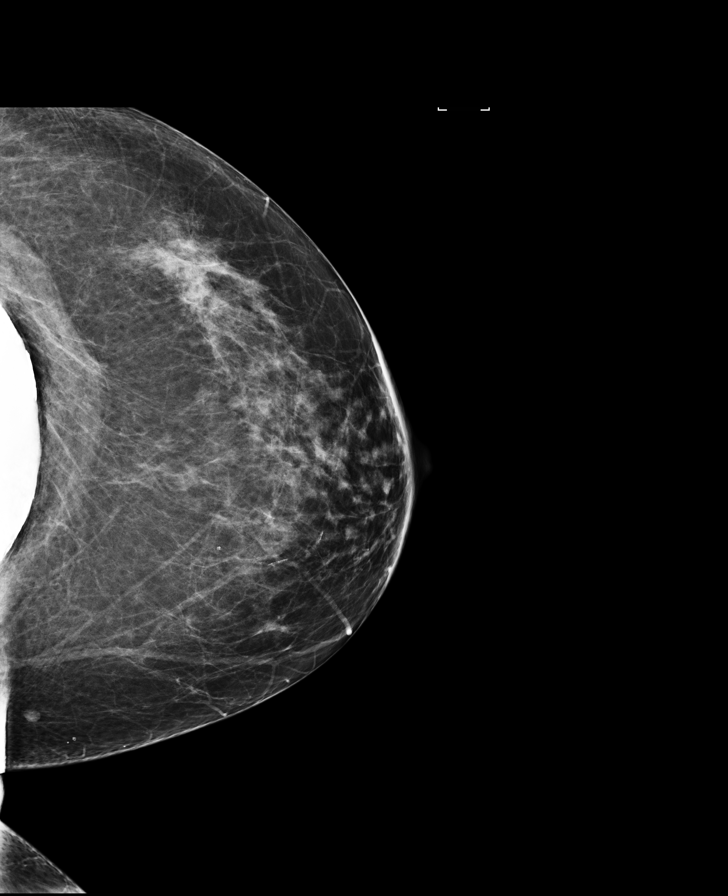

[L MLO synth-2D]
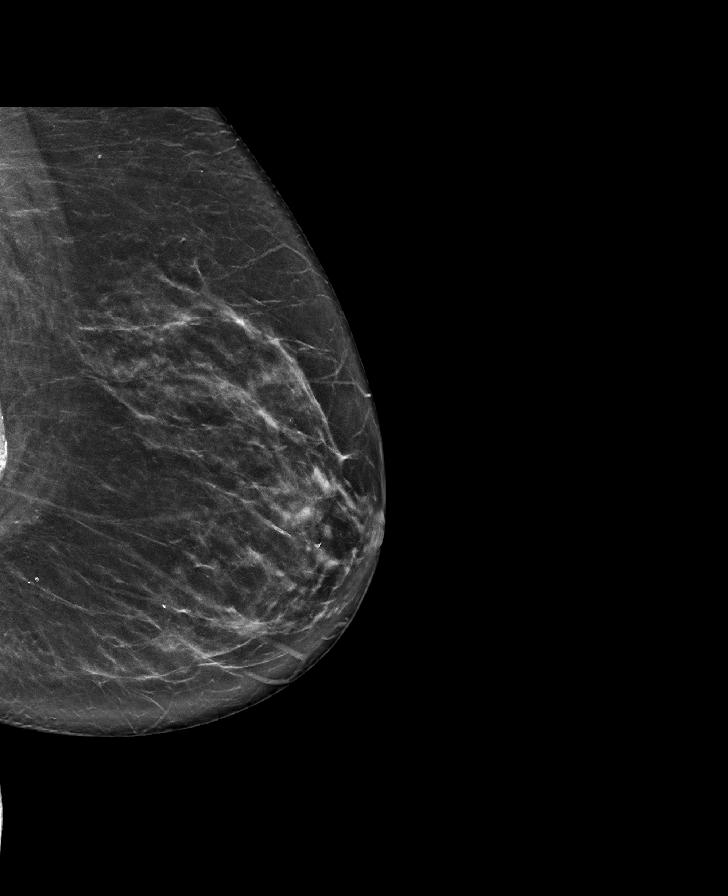

[R CC synth-2D]
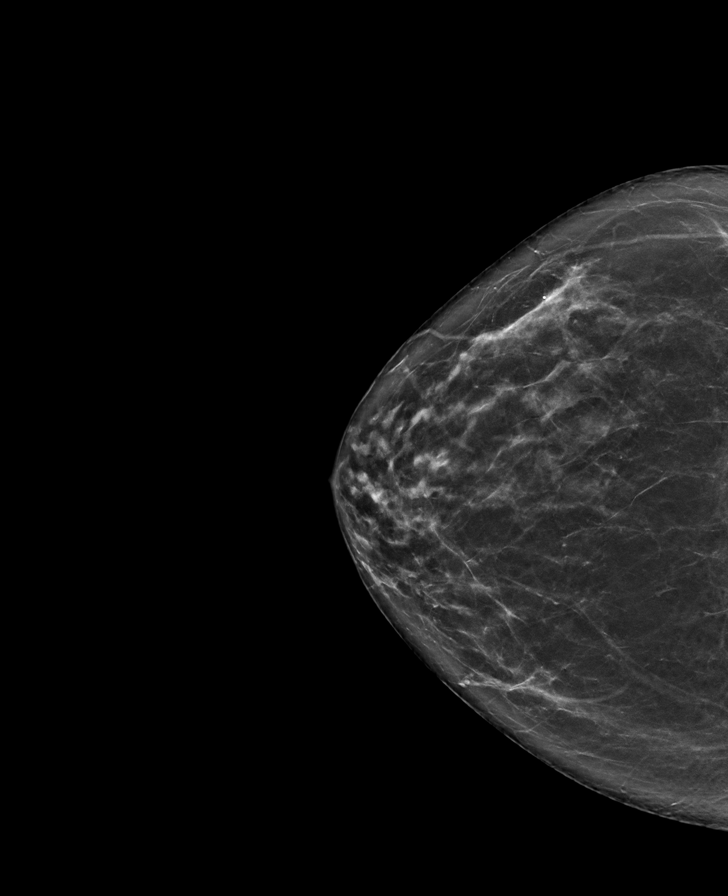

[R MLO synth-2D]
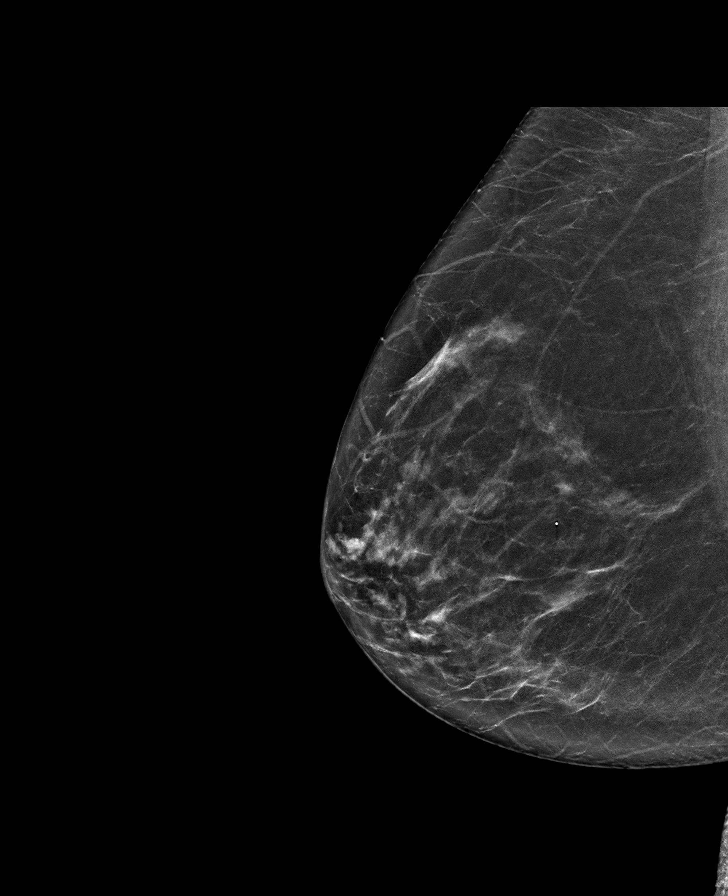

[L CC synth-2D]
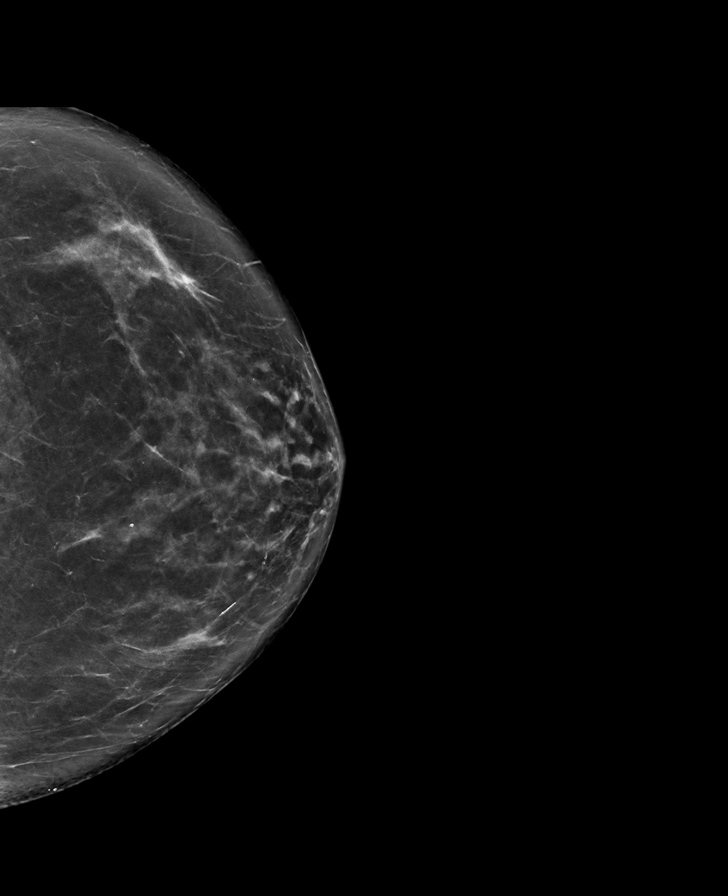

[8 of 28 positions shown; findings below may reference images not displayed]

ACR Breast Density Category b: There are scattered areas of
fibroglandular density.
FINDINGS: The patient has retropectoral implants. There are no findings
suspicious for malignancy.
IMPRESSION: No mammographic evidence of malignancy. A result letter of this
screening mammogram will be mailed directly to the patient.

RECOMMENDATION:
Screening mammogram in one year. (Code:SE-S-JMG)

BI-RADS CATEGORY  1:  Negative.

## 2023-04-15 ENCOUNTER — Encounter: Payer: Self-pay | Admitting: Internal Medicine

## 2023-04-15 ENCOUNTER — Ambulatory Visit (INDEPENDENT_AMBULATORY_CARE_PROVIDER_SITE_OTHER): Payer: Medicare HMO | Admitting: Internal Medicine

## 2023-04-15 VITALS — BP 130/70 | HR 53 | Temp 98.6°F | Ht 65.0 in | Wt 194.2 lb

## 2023-04-15 DIAGNOSIS — Z95818 Presence of other cardiac implants and grafts: Secondary | ICD-10-CM | POA: Diagnosis not present

## 2023-04-15 DIAGNOSIS — Z780 Asymptomatic menopausal state: Secondary | ICD-10-CM | POA: Diagnosis not present

## 2023-04-15 DIAGNOSIS — Z0000 Encounter for general adult medical examination without abnormal findings: Secondary | ICD-10-CM

## 2023-04-15 DIAGNOSIS — E782 Mixed hyperlipidemia: Secondary | ICD-10-CM | POA: Diagnosis not present

## 2023-04-15 DIAGNOSIS — E785 Hyperlipidemia, unspecified: Secondary | ICD-10-CM

## 2023-04-15 DIAGNOSIS — I480 Paroxysmal atrial fibrillation: Secondary | ICD-10-CM | POA: Diagnosis not present

## 2023-04-15 DIAGNOSIS — G5602 Carpal tunnel syndrome, left upper limb: Secondary | ICD-10-CM

## 2023-04-15 DIAGNOSIS — E559 Vitamin D deficiency, unspecified: Secondary | ICD-10-CM | POA: Diagnosis not present

## 2023-04-15 DIAGNOSIS — I10 Essential (primary) hypertension: Secondary | ICD-10-CM

## 2023-04-15 LAB — VITAMIN D 25 HYDROXY (VIT D DEFICIENCY, FRACTURES): VITD: 45.44 ng/mL (ref 30.00–100.00)

## 2023-04-15 LAB — CBC WITH DIFFERENTIAL/PLATELET
Basophils Absolute: 0 10*3/uL (ref 0.0–0.1)
Basophils Relative: 0.5 % (ref 0.0–3.0)
Eosinophils Absolute: 0.1 10*3/uL (ref 0.0–0.7)
Eosinophils Relative: 1.6 % (ref 0.0–5.0)
HCT: 37.6 % (ref 36.0–46.0)
Hemoglobin: 12.4 g/dL (ref 12.0–15.0)
Lymphocytes Relative: 20.6 % (ref 12.0–46.0)
Lymphs Abs: 1.4 10*3/uL (ref 0.7–4.0)
MCHC: 33 g/dL (ref 30.0–36.0)
MCV: 105.3 fL — ABNORMAL HIGH (ref 78.0–100.0)
Monocytes Absolute: 0.5 10*3/uL (ref 0.1–1.0)
Monocytes Relative: 6.8 % (ref 3.0–12.0)
Neutro Abs: 4.9 10*3/uL (ref 1.4–7.7)
Neutrophils Relative %: 70.5 % (ref 43.0–77.0)
Platelets: 176 10*3/uL (ref 150.0–400.0)
RBC: 3.57 Mil/uL — ABNORMAL LOW (ref 3.87–5.11)
RDW: 13.8 % (ref 11.5–15.5)
WBC: 6.9 10*3/uL (ref 4.0–10.5)

## 2023-04-15 LAB — VITAMIN B12: Vitamin B-12: 582 pg/mL (ref 211–911)

## 2023-04-15 LAB — LIPID PANEL
Cholesterol: 206 mg/dL — ABNORMAL HIGH (ref 0–200)
HDL: 63.6 mg/dL (ref >39.00)
LDL Cholesterol: 124 mg/dL — ABNORMAL HIGH (ref 0–99)
NonHDL: 142.86
Total CHOL/HDL Ratio: 3
Triglycerides: 94 mg/dL (ref 0.0–149.0)
VLDL: 18.8 mg/dL (ref 0.0–40.0)

## 2023-04-15 LAB — COMPREHENSIVE METABOLIC PANEL
ALT: 10 U/L (ref 0–35)
AST: 14 U/L (ref 0–37)
Albumin: 4.5 g/dL (ref 3.5–5.2)
Alkaline Phosphatase: 68 U/L (ref 39–117)
BUN: 17 mg/dL (ref 6–23)
CO2: 28 meq/L (ref 19–32)
Calcium: 9.3 mg/dL (ref 8.4–10.5)
Chloride: 103 meq/L (ref 96–112)
Creatinine, Ser: 0.74 mg/dL (ref 0.40–1.20)
GFR: 79.36 mL/min (ref >60.00)
Glucose, Bld: 95 mg/dL (ref 70–99)
Potassium: 4.5 meq/L (ref 3.5–5.1)
Sodium: 139 meq/L (ref 135–145)
Total Bilirubin: 0.8 mg/dL (ref 0.2–1.2)
Total Protein: 6.3 g/dL (ref 6.0–8.3)

## 2023-04-15 LAB — TSH: TSH: 0.92 u[IU]/mL (ref 0.35–5.50)

## 2023-04-15 NOTE — Progress Notes (Signed)
Medicare Wellness question were answered 04/12/23.

## 2023-04-15 NOTE — Progress Notes (Signed)
Established Patient Office Visit     CC/Reason for Visit: Annual preventive exam, subsequent Medicare wellness visit, discuss chronic and acute concerns  HPI: Shelly Bowers is a 73 y.o. female who is coming in today for the above mentioned reasons. Past Medical History is significant for: Atrial fibrillation status post Watchman procedure, hypertension, hyperlipidemia, hypothyroidism, vitamin D deficiency.  She is starting to complain of numbness of her left thumb index and middle finger.  She is left-handed.  Her hand feels achy and tired.  She has routine eye and dental care.  No perceived hearing deficit.  She is due for COVID-vaccine and a bone density scan.  All other cancer screening is up-to-date.   Past Medical/Surgical History: Past Medical History:  Diagnosis Date   Atrial fibrillation (HCC)    Cardiomyopathy (HCC)    HTN (hypertension)    Hyperlipidemia    Osteopenia    Presence of Watchman left atrial appendage closure device 07/12/2022   31mm Watchman FLX with Dr. Lalla Brothers    Past Surgical History:  Procedure Laterality Date   ATRIAL FIBRILLATION ABLATION N/A 11/21/2020   Procedure: ATRIAL FIBRILLATION ABLATION;  Surgeon: Lanier Prude, MD;  [facility];  Service: Cardiovascular;  Laterality: N/A;   AUGMENTATION MAMMAPLASTY Bilateral    BREAST EXCISIONAL BIOPSY Left    BUBBLE STUDY  08/25/2020   Procedure: BUBBLE STUDY;  Surgeon: Christell Constant, MD;  Location: [facility];  Service: Cardiovascular;;   CARDIOVERSION N/A 08/25/2020   Procedure: CARDIOVERSION;  Surgeon: Christell Constant, MD;  [facility];  Service: Cardiovascular;  Laterality: N/A;   CARDIOVERSION N/A 12/26/2020   Procedure: CARDIOVERSION;  Surgeon: Christell Constant, MD;  [facility];  Service: Cardiovascular;  Laterality: N/A;   INCISION AND DRAINAGE ABSCESS Left 12/27/2015   Procedure: INCISION AND DRAINAGE ABSCESS;  Surgeon: Cindee Salt, MD;  Location: [facility];  Service: Orthopedics;  Laterality: Left;   LEFT ATRIAL APPENDAGE OCCLUSION N/A 07/12/2022   Procedure: LEFT ATRIAL APPENDAGE OCCLUSION;  Surgeon: Lanier Prude, MD;  [facility];  Service: Cardiovascular;  Laterality: N/A;   TEE WITHOUT CARDIOVERSION N/A 08/25/2020   Procedure: TRANSESOPHAGEAL ECHOCARDIOGRAM (TEE);  Surgeon: Christell Constant, MD;  Location: [facility];  Service: Cardiovascular;  Laterality: N/A;   TEE WITHOUT CARDIOVERSION N/A 07/12/2022   Procedure: TRANSESOPHAGEAL ECHOCARDIOGRAM;  Surgeon: Lanier Prude, MD;  Location: [facility] INVASIVE CV LAB;  Service: Cardiovascular;  Laterality: N/A;    Social History:  reports that she has never smoked. She has never used smokeless tobacco. She reports current alcohol use of about 14.0 standard drinks of alcohol per week. She reports that she does not use drugs.  Allergies: Allergies  Allergen Reactions   Codeine Nausea Only   Hydrocodone Nausea And Vomiting    Family History:  Family History  Problem Relation Age of Onset   Breast cancer Mother 35   Atrial fibrillation Father      Current Outpatient Medications:    atorvastatin (LIPITOR) 20 MG tablet, TAKE 1 TABLET BY MOUTH EVERY DAY, Disp: 90 tablet, Rfl: 1   CALCIUM-VITAMIN D PO, Take 1 tablet by mouth daily., Disp: , Rfl:    celecoxib (CELEBREX) 200 MG capsule, TAKE 1 CAPSULE BY MOUTH 2 TIMES DAILY AS NEEDED (Patient taking differently: Take 200 mg by mouth 2 (two) times daily as needed for moderate pain (pain score 4-6).), Disp: 90 capsule, Rfl: 1   cyanocobalamin (VITAMIN  B12) 1000 MCG tablet, Take 1,000 mcg by mouth daily., Disp: , Rfl:    folic acid (FOLVITE) 800 MCG tablet, Take 800 mcg by mouth daily., Disp: , Rfl:    furosemide (LASIX) 20 MG tablet, Take 1 tablet (20 mg total) by mouth daily as needed for fluid., Disp: 30 tablet, Rfl: 8   ibuprofen (ADVIL,MOTRIN) 200 MG tablet, Take 400 mg  by mouth every 6 (six) hours as needed for headache, fever or mild pain., Disp: , Rfl:    levothyroxine (SYNTHROID) 50 MCG tablet, TAKE 1 TABLET BY MOUTH DAILY BEFORE BREAKFAST, Disp: 90 tablet, Rfl: 0   metoprolol succinate (TOPROL-XL) 25 MG 24 hr tablet, TAKE 1/2 TABLET BY MOUTH DAILY, Disp: 45 tablet, Rfl: 3   metroNIDAZOLE (METROGEL) 0.75 % gel, Apply 1 application topically at bedtime., Disp: , Rfl:    naphazoline-pheniramine (VISINE) 0.025-0.3 % ophthalmic solution, Place 1 drop into both eyes as needed for eye irritation., Disp: , Rfl:    sacubitril-valsartan (ENTRESTO) 24-26 MG, TAKE 1 TABLET BY MOUTH TWICE A DAY, Disp: 180 tablet, Rfl: 3   vitamin E 180 MG (400 UNITS) capsule, Take 400 Units by mouth daily., Disp: , Rfl:   Review of Systems:  Negative unless indicated in HPI.   Physical Exam: Vitals:   04/15/23 0902  BP: 130/70  Pulse: (!) 53  Temp: 98.6 F (37 C)  TempSrc: Oral  SpO2: 96%  Weight: 194 lb 3.2 oz (88.1 kg)  Height: 5\' 5"  (1.651 m)    Body mass index is 32.32 kg/m.   Physical Exam Vitals reviewed.  Constitutional:      General: She is not in acute distress.    Appearance: Normal appearance. She is not ill-appearing, toxic-appearing or diaphoretic.  HENT:     Head: Normocephalic.     Right Ear: Tympanic membrane, ear canal and external ear normal. There is no impacted cerumen.     Left Ear: Tympanic membrane, ear canal and external ear normal. There is no impacted cerumen.     Nose: Nose normal.     Mouth/Throat:     Mouth: Mucous membranes are moist.     Pharynx: Oropharynx is clear. No oropharyngeal exudate or posterior oropharyngeal erythema.  Eyes:     General: No scleral icterus.       Right eye: No discharge.        Left eye: No discharge.     Conjunctiva/sclera: Conjunctivae normal.     Pupils: Pupils are equal, round, and reactive to light.  Neck:     Vascular: No carotid bruit.  Cardiovascular:     Rate and Rhythm: Normal rate and  regular rhythm.     Pulses: Normal pulses.     Heart sounds: Normal heart sounds.  Pulmonary:     Effort: Pulmonary effort is normal. No respiratory distress.     Breath sounds: Normal breath sounds.  Abdominal:     General: Abdomen is flat. Bowel sounds are normal.     Palpations: Abdomen is soft.  Musculoskeletal:        General: Normal range of motion.     Cervical back: Normal range of motion.  Skin:    General: Skin is warm and dry.  Neurological:     General: No focal deficit present.     Mental Status: She is alert and oriented to person, place, and time. Mental status is at baseline.  Psychiatric:        Mood and Affect: Mood normal.  Behavior: Behavior normal.        Thought Content: Thought content normal.        Judgment: Judgment normal.    Subsequent Medicare wellness visit   1. Risk factors, based on past  M,S,F - Cardiac Risk Factors include: advanced age (>3men, >71 women)   2.  Physical activities: Dietary issues and exercise activities discussed:      3.  Depression/mood:  Flowsheet Row Office Visit from 10/10/2022 in [facility] Total Score 0        4.  ADL's:    04/11/2023    4:38 PM 07/12/2022   11:35 AM  In your present state of health, do you have any difficulty performing the following activities:  Hearing? 0 0  Vision? 0 0  Difficulty concentrating or making decisions? 0 0  Walking or climbing stairs? 0 0  Dressing or bathing? 0 0  Doing errands, shopping? 0   Preparing Food and eating ? N   Using the Toilet? N   In the past six months, have you accidently leaked urine? N   Do you have problems with loss of bowel control? N   Managing your Medications? N   Managing your Finances? N   Housekeeping or managing your Housekeeping? N      5.  Fall risk:     08/17/2021   10:12 AM 02/07/2022    3:19 PM 04/11/2022   10:40 AM 10/10/2022   10:03 AM 04/11/2023    4:40 PM  Fall Risk  Falls in the  past year? 0 0 0 0 0  Was there an injury with Fall? 0 0 0 0 0  Fall Risk Category Calculator 0 0 0 0 0  Fall Risk Category (Retired) Low Low     (RETIRED) Patient Fall Risk Level  Low fall risk     Patient at Risk for Falls Due to No Fall Risks No Fall Risks No Fall Risks    Fall risk Follow up Falls evaluation completed Falls evaluation completed Falls evaluation completed Falls evaluation completed Falls evaluation completed     6.  Home safety: No problems identified   7.  Height weight, and visual acuity: height and weight as above, vision/hearing: Vision Screening   Right eye Left eye Both eyes  Without correction     With correction 20/25 20/25 20/25      8.  Counseling: Counseling given: Not Answered    9. Lab orders based on risk factors: Laboratory update will be reviewed   10. Cognitive assessment:        04/11/2023    4:40 PM  6CIT Screen  What Year? 0 points  What month? 0 points  What time? 0 points  Count back from 20 0 points  Months in reverse 0 points  Repeat phrase 0 points  Total Score 0 points     11. Screening: Patient provided with a written and personalized 5-10 year screening schedule in the AVS. Health Maintenance  Topic Date Due   Pap Smear  08/16/2021   COVID-19 Vaccine (6 - 2024-25 season) 11/25/2022   Mammogram  07/03/2023   Cologuard (Stool DNA test)  02/28/2024   Medicare Annual Wellness Visit  04/14/2024   DTaP/Tdap/Td vaccine (2 - Td or Tdap) 04/20/2031   Pneumonia Vaccine  Completed   Flu Shot  Completed   DEXA scan (bone density measurement)  Completed   Hepatitis C Screening  Completed   Zoster (Shingles)  Vaccine  Completed   HPV Vaccine  Aged Out    12. Provider List Update: Patient Care Team    Relationship Specialty Notifications Start End  Philip Aspen, Limmie Lineth, MD PCP - General Internal Medicine  02/28/18   Rollene Rotunda, MD PCP - Cardiology Cardiology  08/24/20   Lanier Prude, MD PCP - Electrophysiology  Cardiology  11/08/20   Sherrill Raring, [facility]  Pharmacist  08/14/22      13. Advance Directives: Does Patient Have a Medical Advance Directive?: Yes Type of Advance Directive: Healthcare Power of Attorney, Living will, Out of facility DNR (pink MOST or yellow form) Does patient want to make changes to medical advance directive?: No - Patient declined Copy of Healthcare Power of Attorney in Chart?: No - copy requested  14. Opioids: Patient is not on any opioid prescriptions and has no risk factors for a substance use disorder.   15.   Goals      Weight (lb) < 200 lb (90.7 kg)         I have personally reviewed and noted the following in the patient's chart:   Medical and social history Use of alcohol, tobacco or illicit drugs  Current medications and supplements Functional ability and status Nutritional status Physical activity Advanced directives List of other physicians Hospitalizations, surgeries, and ER visits in previous 12 months Vitals Screenings to include cognitive, depression, and falls Referrals and appointments  In addition, I have reviewed and discussed with patient certain preventive protocols, quality metrics, and best practice recommendations. A written personalized care plan for preventive services as well as general preventive health recommendations were provided to patient.   Impression and Plan:  Medicare annual wellness visit, subsequent  Essential hypertension -     CBC with Differential/Platelet; Future -     Comprehensive metabolic panel; Future  Mixed hyperlipidemia -     Lipid panel; Future  Vitamin D deficiency -     VITAMIN D 25 Hydroxy (Vit-D Deficiency, Fractures); Future  Paroxysmal atrial fibrillation (HCC) -     TSH; Future -     Vitamin B12; Future  Dyslipidemia  Presence of Watchman left atrial appendage closure device  Left carpal tunnel syndrome  Postmenopausal estrogen deficiency -     DG Bone Density;  Future  -Recommend routine eye and dental care. -Healthy lifestyle discussed in detail. -Labs to be updated today. -Prostate cancer screening: N/A Health Maintenance  Topic Date Due   Pap Smear  08/16/2021   COVID-19 Vaccine (6 - 2024-25 season) 11/25/2022   Mammogram  07/03/2023   Cologuard (Stool DNA test)  02/28/2024   Medicare Annual Wellness Visit  04/14/2024   DTaP/Tdap/Td vaccine (2 - Td or Tdap) 04/20/2031   Pneumonia Vaccine  Completed   Flu Shot  Completed   DEXA scan (bone density measurement)  Completed   Hepatitis C Screening  Completed   Zoster (Shingles) Vaccine  Completed   HPV Vaccine  Aged Out      -Will obtain COVID-vaccine at pharmacy. -DEXA scheduled. -For her left carpal tunnel syndrome we will try conservative management first with the wrist splint.  If no improvement she will follow-up with her hand surgeon Dr. Merlyn Lot.    Chaya Jan, MD [facility]

## 2023-04-21 DIAGNOSIS — G4733 Obstructive sleep apnea (adult) (pediatric): Secondary | ICD-10-CM | POA: Diagnosis not present

## 2023-04-22 ENCOUNTER — Encounter: Payer: Self-pay | Admitting: Internal Medicine

## 2023-04-22 DIAGNOSIS — E782 Mixed hyperlipidemia: Secondary | ICD-10-CM

## 2023-04-22 MED ORDER — ATORVASTATIN CALCIUM 40 MG PO TABS: 40.0000 mg | ORAL_TABLET | Freq: Every day | ORAL | 1 refills | Status: DC

## 2023-04-29 ENCOUNTER — Ambulatory Visit (HOSPITAL_BASED_OUTPATIENT_CLINIC_OR_DEPARTMENT_OTHER)
Admission: RE | Admit: 2023-04-29 | Discharge: 2023-04-29 | Disposition: A | Discharge status: Home / Self Care | Payer: Medicare HMO | Source: Ambulatory Visit | Attending: Internal Medicine | Admitting: Internal Medicine

## 2023-04-29 DIAGNOSIS — Z780 Asymptomatic menopausal state: Secondary | ICD-10-CM | POA: Diagnosis not present

## 2023-05-12 ENCOUNTER — Other Ambulatory Visit: Payer: Self-pay | Admitting: Internal Medicine

## 2023-05-12 DIAGNOSIS — E039 Hypothyroidism, unspecified: Secondary | ICD-10-CM

## 2023-05-21 ENCOUNTER — Encounter: Payer: Self-pay | Admitting: Internal Medicine

## 2023-07-31 ENCOUNTER — Encounter: Payer: Self-pay | Admitting: Family

## 2023-07-31 ENCOUNTER — Ambulatory Visit: Admitting: Family

## 2023-07-31 ENCOUNTER — Other Ambulatory Visit (INDEPENDENT_AMBULATORY_CARE_PROVIDER_SITE_OTHER): Payer: Self-pay

## 2023-07-31 DIAGNOSIS — M25561 Pain in right knee: Secondary | ICD-10-CM

## 2023-07-31 MED ORDER — LIDOCAINE HCL 1 % IJ SOLN
5.0000 mL | INTRAMUSCULAR | Status: AC | PRN
Start: 2023-07-31 — End: 2023-07-31
  Administered 2023-07-31: 5 mL

## 2023-07-31 MED ORDER — METHYLPREDNISOLONE ACETATE 40 MG/ML IJ SUSP
40.0000 mg | INTRAMUSCULAR | Status: AC | PRN
  Administered 2023-07-31: 40 mg via INTRA_ARTICULAR

## 2023-07-31 NOTE — Progress Notes (Signed)
 Office Visit Note   Patient: Shelly Bowers           Date of Birth: 12-31-1950           MRN: [id number] Visit Date: 07/31/2023              Requested by: Zilphia Hilt, Charyl Coppersmith, MD [address] [place],  [place] [address] PCP: Zilphia Hilt, Charyl Coppersmith, MD  Chief Complaint  Patient presents with   Right Knee - Pain      HPI: The patient is a 73 year old woman who presents today for evaluation of right knee pain she fell walking up her stairs about 3 weeks ago she initially had several other injuries and did not notice her knee to be bothering her for about 10 days.  She has had anterior knee pain popping sensations medially fullness complains of some associated swelling she complains of pain with pivoting and walking laterally  Assessment & Plan: Visit Diagnoses:  1. Acute pain of right knee     Plan: Depo-Medrol injection today.  Concern for possible meniscal involvement  Follow-Up Instructions: No follow-ups on file.   Right Knee Exam   Muscle Strength  The patient has normal right knee strength.  Tenderness  The patient is experiencing tenderness in the medial joint line and lateral joint line.  Range of Motion  The patient has normal right knee ROM.  Tests  Varus: negative Valgus: negative  Other  Swelling: moderate Effusion: no effusion present      Patient is alert, oriented, no adenopathy, well-dressed, normal affect, normal respiratory effort.   Imaging: No results found. No images are attached to the encounter.  Labs: Lab Results  Component Value Date   HGBA1C 5.5 04/11/2022   HGBA1C 5.7 02/10/2021     Lab Results  Component Value Date   ALBUMIN 4.5 04/15/2023   ALBUMIN 4.5 04/11/2022   ALBUMIN 4.6 05/31/2021    Lab Results  Component Value Date   MG 2.0 09/01/2020   MG 1.6 (L) 08/26/2020   MG 2.0 08/23/2020   Lab Results  Component Value Date   VD25OH 45.44 04/15/2023   VD25OH 37.45 10/10/2022   VD25OH  35.43 07/17/2022    No results found for: "PREALBUMIN"    Latest Ref Rng & Units 04/15/2023    9:44 AM 06/25/2022   11:23 AM 04/11/2022   10:59 AM  CBC EXTENDED  WBC 4.0 - 10.5 K/uL 6.9  4.9  4.8   RBC 3.87 - 5.11 Mil/uL 3.57  3.53  3.64   Hemoglobin 12.0 - 15.0 g/dL 62.1  30.8  65.7   HCT 36.0 - 46.0 % 37.6  36.4  36.9   Platelets 150.0 - 400.0 K/uL 176.0  197  187.0   NEUT# 1.4 - 7.7 K/uL 4.9   2.6   Lymph# 0.7 - 4.0 K/uL 1.4   1.7      There is no height or weight on file to calculate BMI.  Orders:  Orders Placed This Encounter  Procedures   XR Knee 1-2 Views Right   No orders of the defined types were placed in this encounter.    Procedures: Large Joint Inj: R knee on 07/31/2023 2:10 PM Indications: pain Details: 18 G 1.5 in needle, anteromedial approach Medications: 5 mL lidocaine  1 %; 40 mg methylPREDNISolone acetate 40 MG/ML Consent was given by the patient.      Clinical Data: No additional findings.  ROS:  All other systems negative, except  as noted in the HPI. Review of Systems  Objective: Vital Signs: There were no vitals taken for this visit.  Specialty Comments:  EXAM: MRI LUMBAR SPINE WITHOUT CONTRAST   TECHNIQUE: Multiplanar, multisequence MR imaging of the lumbar spine was performed. No intravenous contrast was administered.   COMPARISON:  12/28/2008   FINDINGS: The vertebral bodies of the lumbar spine are normal in size. The vertebral bodies of the lumbar spine are normal in alignment. There is normal bone marrow signal demonstrated throughout the vertebra. There is degenerative disc disease with disc height loss at L1-2 and L4-5.   The spinal cord is normal in signal and contour. The cord terminates normally at L1 . The nerve roots of the cauda equina and the filum terminale are normal.   The visualized portions of the SI joints are unremarkable.   The imaged intra-abdominal contents are unremarkable.   T12-L1: No significant  disc bulge. No evidence of neural foraminal stenosis. No central canal stenosis.   L1-L2: Mild broad-based disc bulge. Mild right facet arthropathy. Moderate left facet arthropathy and left lateral recess stenosis. Moderate left foraminal stenosis.   L2-L3: Mild broad-based disc bulge. Mild bilateral facet arthropathy. Left lateral recess stenosis. No evidence of neural foraminal stenosis. No central canal stenosis.   L3-L4: Mild broad-based disc bulge. Moderate bilateral facet arthropathy with lateral recess stenosis and mild central canal stenosis. No evidence of neural foraminal stenosis.   L4-L5: Mild broad-based disc bulge. Severe right and mild left facet arthropathy. Mild right foraminal stenosis. No left foraminal stenosis.   L5-S1: Mild broad-based disc bulge. No evidence of neural foraminal stenosis. No central canal stenosis.   IMPRESSION: 1. At L1-2 there is mild broad-based disc bulge. Mild right facet arthropathy. Moderate left facet arthropathy and left lateral recess stenosis. Moderate left foraminal stenosis. 2. At L3-4 there is mild broad-based disc bulge. Moderate bilateral facet arthropathy with lateral recess stenosis and mild central canal stenosis. 3. At L4-5 with a mild broad-based disc bulge. Severe right and mild left facet arthropathy. Mild right foraminal stenosis.     Electronically Signed   By: Onnie Bilis   On: 04/28/2015 10:01  PMFS History: Patient Active Problem List   Diagnosis Date Noted   Atrial fibrillation (HCC) 07/12/2022   Presence of Watchman left atrial appendage closure device 07/12/2022   Dilated cardiomyopathy (HCC) 12/13/2021   Snoring 12/13/2021   Nocturia 12/13/2021   Severe obstructive sleep apnea-hypopnea syndrome 12/13/2021   Hypothyroidism 08/17/2021   Secondary hypercoagulable state (HCC) 12/19/2020   Acute systolic HF (heart failure) (HCC) 09/18/2020   Hypomagnesemia 09/18/2020   Hypokalemia 09/18/2020    Persistent atrial fibrillation (HCC) 08/24/2020   Atrial fibrillation with RVR (HCC) 08/23/2020   Macrocytosis without anemia 01/26/2020   Vitamin D  deficiency 01/26/2020   SCC (squamous cell carcinoma) 04/25/2018   Hyperlipidemia 02/28/2018   Osteopenia 02/28/2018   Essential hypertension 10/02/2017   Chronic sciatica 08/21/2017   Menopausal symptom 08/21/2017   Granuloma of skin 01/04/2016   Dyslipidemia 04/20/2013   Encounter for preventive health examination 03/15/2011   HIP PAIN, LEFT 10/28/2008   Past Medical History:  Diagnosis Date   Atrial fibrillation (HCC)    Cardiomyopathy (HCC)    HTN (hypertension)    Hyperlipidemia    Osteopenia    Presence of Watchman left atrial appendage closure device 07/12/2022   31mm Watchman FLX with Dr. Marven Slimmer    Family History  Problem Relation Age of Onset   Breast cancer Mother 70  Atrial fibrillation Father     Past Surgical History:  Procedure Laterality Date   ATRIAL FIBRILLATION ABLATION N/A 11/21/2020   Procedure: ATRIAL FIBRILLATION ABLATION;  Surgeon: Boyce Byes, MD;  [facility];  Service: Cardiovascular;  Laterality: N/A;   AUGMENTATION MAMMAPLASTY Bilateral    BREAST EXCISIONAL BIOPSY Left    BUBBLE STUDY  08/25/2020   Procedure: BUBBLE STUDY;  Surgeon: Jann Melody, MD;  Location: [facility];  Service: Cardiovascular;;   CARDIOVERSION N/A 08/25/2020   Procedure: CARDIOVERSION;  Surgeon: Jann Melody, MD;  Location: [facility];  Service: Cardiovascular;  Laterality: N/A;   CARDIOVERSION N/A 12/26/2020   Procedure: CARDIOVERSION;  Surgeon: Jann Melody, MD;  Location: [facility];  Service: Cardiovascular;  Laterality: N/A;   INCISION AND DRAINAGE ABSCESS Left 12/27/2015   Procedure: INCISION AND DRAINAGE ABSCESS;  Surgeon: Lyanne Sample, MD;  Location: [facility];  Service: Orthopedics;  Laterality: Left;   LEFT ATRIAL APPENDAGE OCCLUSION N/A 07/12/2022    Procedure: LEFT ATRIAL APPENDAGE OCCLUSION;  Surgeon: Boyce Byes, MD;  [facility];  Service: Cardiovascular;  Laterality: N/A;   TEE WITHOUT CARDIOVERSION N/A 08/25/2020   Procedure: TRANSESOPHAGEAL ECHOCARDIOGRAM (TEE);  Surgeon: Jann Melody, MD;  Location[facility] ENDOSCOPY;  Service: Cardiovascular;  Laterality: N/A;   TEE WITHOUT CARDIOVERSION N/A 07/12/2022   Procedure: TRANSESOPHAGEAL ECHOCARDIOGRAM;  Surgeon: Boyce Byes, MD;  Location: [facility] INVASIVE CV LAB;  Service: Cardiovascular;  Laterality: N/A;   Social History   Occupational History   Not on file  Tobacco Use   Smoking status: Never   Smokeless tobacco: Never  Substance and Sexual Activity   Alcohol use: Yes    Alcohol/week: 14.0 standard drinks of alcohol    Types: 14 Glasses of wine per week    Comment: 1 glass of wine nightly   Drug use: No   Sexual activity: Not on file

## 2023-08-13 ENCOUNTER — Telehealth: Payer: Self-pay

## 2023-08-13 NOTE — Telephone Encounter (Signed)
 Called to check on patient, who had LAAO 07/12/2022.  Left message to call back. Will arrange overdue follow-up with Dr. Lavonne Prairie at that time.

## 2023-08-14 NOTE — Telephone Encounter (Signed)
 Called to check in with patient, who had LAAO on 07/12/2022. The patient reports doing well with no issues.  The patient understands to call with questions or concerns.  Scheduled her for overdue follow-up with Dr. Lavonne Prairie next available to reestablish general cardiac care. She was grateful for call and agreed with plan.

## 2023-08-20 DIAGNOSIS — G4733 Obstructive sleep apnea (adult) (pediatric): Secondary | ICD-10-CM | POA: Diagnosis not present

## 2023-08-28 DIAGNOSIS — D2271 Melanocytic nevi of right lower limb, including hip: Secondary | ICD-10-CM | POA: Diagnosis not present

## 2023-08-28 DIAGNOSIS — L821 Other seborrheic keratosis: Secondary | ICD-10-CM | POA: Diagnosis not present

## 2023-08-28 DIAGNOSIS — D225 Melanocytic nevi of trunk: Secondary | ICD-10-CM | POA: Diagnosis not present

## 2023-08-28 DIAGNOSIS — L812 Freckles: Secondary | ICD-10-CM | POA: Diagnosis not present

## 2023-08-28 DIAGNOSIS — L570 Actinic keratosis: Secondary | ICD-10-CM | POA: Diagnosis not present

## 2023-08-28 DIAGNOSIS — L438 Other lichen planus: Secondary | ICD-10-CM | POA: Diagnosis not present

## 2023-08-28 DIAGNOSIS — L304 Erythema intertrigo: Secondary | ICD-10-CM | POA: Diagnosis not present

## 2023-08-28 DIAGNOSIS — D2272 Melanocytic nevi of left lower limb, including hip: Secondary | ICD-10-CM | POA: Diagnosis not present

## 2023-08-28 DIAGNOSIS — D2262 Melanocytic nevi of left upper limb, including shoulder: Secondary | ICD-10-CM | POA: Diagnosis not present

## 2023-09-06 ENCOUNTER — Telehealth: Payer: Self-pay

## 2023-09-06 NOTE — Telephone Encounter (Signed)
 Copied from CRM [id number]. Topic: General - Other >> Sep 06, 2023 11:21 AM Turkey A wrote: Reason for CRM: Patient constantly receiving call from OBGYN to schedule Exam, patient wants to know that since she is 10 and no longer sexually active can she not get the exam-please contact

## 2023-09-10 ENCOUNTER — Encounter: Payer: Self-pay | Admitting: Internal Medicine

## 2023-09-13 ENCOUNTER — Institutional Professional Consult (permissible substitution): Admitting: Plastic Surgery

## 2023-09-17 ENCOUNTER — Other Ambulatory Visit: Payer: Self-pay | Admitting: Internal Medicine

## 2023-09-17 ENCOUNTER — Ambulatory Visit
Admission: RE | Admit: 2023-09-17 | Discharge: 2023-09-17 | Disposition: A | Discharge status: Home / Self Care | Source: Ambulatory Visit | Attending: Internal Medicine | Admitting: Internal Medicine

## 2023-09-17 DIAGNOSIS — Z1231 Encounter for screening mammogram for malignant neoplasm of breast: Secondary | ICD-10-CM

## 2023-09-23 ENCOUNTER — Telehealth: Payer: Self-pay

## 2023-09-23 NOTE — Telephone Encounter (Signed)
 Copied from CRM [id number]. Topic: General - Other >> Sep 23, 2023  2:07 PM Shardie S wrote: Reason for CRM: Att: Jon Lindau- Patient states her prescription discount card has expired and would like a callback at [phone]

## 2023-09-23 NOTE — Progress Notes (Signed)
   09/23/2023  Patient ID: Shelly Bowers, female   DOB: 09-Jan-1951, 73 y.o.   MRN: [id number]  Received notification that patient's discount card for Entresto  had expired.  Re-enrolled patient in entresto  cost assistance through [employer]. Contacted patient's pharmacy to provide updated processing information and confirmed the new price is $0.  Made patient aware and provided processing info to her for her reference, will also provide via a mychart message at patient request.  Patient will reach back out with any future concerns.  Processing info for reference:   Jon VEAR Lindau, PharmD Clinical Pharmacist [phone]

## 2023-09-23 NOTE — Telephone Encounter (Signed)
 Spoke with patient, see other encounter from today for details.

## 2023-09-24 ENCOUNTER — Ambulatory Visit: Admitting: Plastic Surgery

## 2023-09-24 ENCOUNTER — Telehealth: Payer: Self-pay | Admitting: *Deleted

## 2023-09-24 VITALS — BP 136/80 | HR 60 | Ht 65.5 in | Wt 187.8 lb

## 2023-09-24 DIAGNOSIS — H02834 Dermatochalasis of left upper eyelid: Secondary | ICD-10-CM

## 2023-09-24 DIAGNOSIS — H02831 Dermatochalasis of right upper eyelid: Secondary | ICD-10-CM | POA: Diagnosis not present

## 2023-09-24 DIAGNOSIS — H02833 Dermatochalasis of right eye, unspecified eyelid: Secondary | ICD-10-CM | POA: Insufficient documentation

## 2023-09-24 DIAGNOSIS — H02403 Unspecified ptosis of bilateral eyelids: Secondary | ICD-10-CM | POA: Diagnosis not present

## 2023-09-24 NOTE — Telephone Encounter (Signed)
   Name: Shelly Bowers  DOB: January 12, 1951  MRN: [id number]  Primary Cardiologist: Lynwood Schilling, MD  Chart reviewed as part of pre-operative protocol coverage. The patient has an upcoming visit scheduled with Dr. Schilling on 10/10/2023 at which time clearance can be addressed in case there are any issues that would impact surgical recommendations.   I added preop FYI to appointment note so that provider is aware to address at time of outpatient visit.  Per office protocol the cardiology provider should forward their finalized clearance decision and recommendations regarding antiplatelet therapy to the requesting party below.     I will route this message as FYI to requesting party and remove this message from the preop box as separate preop APP input not needed at this time.   Please call with any questions.  Wyn Raddle, Jackee Shove, NP  09/24/2023, 1:55 PM

## 2023-09-24 NOTE — Telephone Encounter (Signed)
   Pre-operative Risk Assessment    Patient Name: Shelly Bowers  DOB: 16-Oct-1950 MRN: [id number]   Date of last office visit: 01/07/23 KATE Minus, NP Date of next office visit: 10/10/23 [facility]. [facility]    Request for Surgical Clearance    Procedure:  B/L BLEPHAROPLASTY  Date of Surgery:  Clearance TBD                                Surgeon:  DR. ESTEFANA FRITTER Surgeon's Group or Practice Name:  [facility] Phone number:  [phone] Fax number:  [phone]   Type of Clearance Requested:   - Medical ; NONE INDICATED TO BE HELD   Type of Anesthesia:  General    Additional requests/questions:    [name]   09/24/2023, 1:50 PM

## 2023-09-24 NOTE — Progress Notes (Signed)
 Patient ID: Shelly Bowers, female    DOB: [birth year], 73 y.o.   MRN: [id number]   Chief Complaint  Patient presents with   Advice Only    The patient is a 73 year old female here for evaluation of her eyelids.  She is interested in a blepharoplasty.  She complains of continual trouble with her visual field.  It is worse in the evening.  It is worse in the superior quadrants.  She finds herself having to either tilts her head back or lift her lids in order to have full visual field vision.  She is 5 feet 5 inches tall and weighs 187 pounds.  She had cardiac procedure and states that she cannot get epinephrine.  She has a visual field exam scheduled for August.  She does not have any other complaints.    Review of Systems  Constitutional: Negative.   HENT: Negative.    Eyes: Negative.   Respiratory: Negative.    Cardiovascular: Negative.   Gastrointestinal: Negative.   Endocrine: Negative.   Genitourinary: Negative.   Musculoskeletal: Negative.     Past Medical History:  Diagnosis Date   Atrial fibrillation (HCC)    Cardiomyopathy (HCC)    HTN (hypertension)    Hyperlipidemia    Osteopenia    Presence of Watchman left atrial appendage closure device 07/12/2022   31mm Watchman FLX with Dr. Cindie    Past Surgical History:  Procedure Laterality Date   ATRIAL FIBRILLATION ABLATION N/A 11/21/2020   Procedure: ATRIAL FIBRILLATION ABLATION;  Surgeon: Cindie Ole DASEN, MD;  [facility]: [facility];  Service: Cardiovascular;  Laterality: N/A;   AUGMENTATION MAMMAPLASTY Bilateral    BREAST EXCISIONAL BIOPSY Left    BUBBLE STUDY  08/25/2020   Procedure: BUBBLE STUDY;  Surgeon: Santo Stanly LABOR, MD;  Location: [facility];  Service: Cardiovascular;;   CARDIOVERSION N/A 08/25/2020   Procedure: CARDIOVERSION;  Surgeon: Santo Stanly LABOR, MD;  Location: [facility];  Service: Cardiovascular;  Laterality: N/A;   CARDIOVERSION N/A 12/26/2020   Procedure:  CARDIOVERSION;  Surgeon: Santo Stanly LABOR, MD;  Location: [facility];  Service: Cardiovascular;  Laterality: N/A;   INCISION AND DRAINAGE ABSCESS Left 12/27/2015   Procedure: INCISION AND DRAINAGE ABSCESS;  Surgeon: Arley Curia, MD;  Location: [facility];  Service: Orthopedics;  Laterality: Left;   LEFT ATRIAL APPENDAGE OCCLUSION N/A 07/12/2022   Procedure: LEFT ATRIAL APPENDAGE OCCLUSION;  Surgeon: Cindie Ole DASEN, MD;  [facility];  Service: Cardiovascular;  Laterality: N/A;   TEE WITHOUT CARDIOVERSION N/A 08/25/2020   Procedure: TRANSESOPHAGEAL ECHOCARDIOGRAM (TEE);  Surgeon: Santo Stanly LABOR, MD;  Location: [facility] ENDOSCOPY;  Service: Cardiovascular;  Laterality: N/A;   TEE WITHOUT CARDIOVERSION N/A 07/12/2022   Procedure: TRANSESOPHAGEAL ECHOCARDIOGRAM;  Surgeon: Cindie Ole DASEN, MD;  Location: [facility] INVASIVE CV LAB;  Service: Cardiovascular;  Laterality: N/A;      Current Outpatient Medications:    atorvastatin  (LIPITOR) 40 MG tablet, Take 1 tablet (40 mg total) by mouth daily., Disp: 90 tablet, Rfl: 1   CALCIUM -VITAMIN D  PO, Take 1 tablet by mouth daily., Disp: , Rfl:    celecoxib  (CELEBREX ) 200 MG capsule, TAKE 1 CAPSULE BY MOUTH 2 TIMES DAILY AS NEEDED (Patient taking differently: Take 200 mg by mouth 2 (two) times daily as needed for moderate pain (pain score 4-6).), Disp: 90 capsule, Rfl: 1   cyanocobalamin  (VITAMIN B12) 1000 MCG tablet, Take 1,000 mcg by mouth daily., Disp: , Rfl:  folic acid (FOLVITE) 800 MCG tablet, Take 800 mcg by mouth daily., Disp: , Rfl:    furosemide  (LASIX ) 20 MG tablet, Take 1 tablet (20 mg total) by mouth daily as needed for fluid., Disp: 30 tablet, Rfl: 8   ibuprofen (ADVIL,MOTRIN) 200 MG tablet, Take 400 mg by mouth every 6 (six) hours as needed for headache, fever or mild pain., Disp: , Rfl:    levothyroxine  (SYNTHROID ) 50 MCG tablet, TAKE 1 TABLET BY MOUTH DAILY BEFORE BREAKFAST, Disp: 90 tablet, Rfl: 1    metoprolol  succinate (TOPROL -XL) 25 MG 24 hr tablet, TAKE 1/2 TABLET BY MOUTH DAILY, Disp: 45 tablet, Rfl: 3   metroNIDAZOLE (METROGEL) 0.75 % gel, Apply 1 application topically at bedtime., Disp: , Rfl:    naphazoline-pheniramine (VISINE) 0.025-0.3 % ophthalmic solution, Place 1 drop into both eyes as needed for eye irritation., Disp: , Rfl:    sacubitril -valsartan  (ENTRESTO ) 24-26 MG, TAKE 1 TABLET BY MOUTH TWICE A DAY, Disp: 180 tablet, Rfl: 3   vitamin E 180 MG (400 UNITS) capsule, Take 400 Units by mouth daily., Disp: , Rfl:    Objective:   Vitals:   09/24/23 0937  BP: 136/80  Pulse: 60  SpO2: 96%    Physical Exam Vitals reviewed.  Constitutional:      Appearance: Normal appearance.   Cardiovascular:     Rate and Rhythm: Normal rate.     Pulses: Normal pulses.  Pulmonary:     Effort: Pulmonary effort is normal.   Skin:    General: Skin is warm.     Capillary Refill: Capillary refill takes less than 2 seconds.     Coloration: Skin is not jaundiced.     Findings: No bruising or lesion.   Neurological:     Mental Status: She is alert and oriented to person, place, and time.   Psychiatric:        Mood and Affect: Mood normal.        Behavior: Behavior normal.        Thought Content: Thought content normal.        Judgment: Judgment normal.     Assessment & Plan:  Ptosis of both upper eyelids - Plan: Ambulatory referral to Ophthalmology  Dermatochalasis of both upper eyelids  We will send the patient for a visual field exam and then I will plan to talk with her afterwards and see if we can submit that to insurance.  We will try to avoid epinephrine.  Plan will be for bilateral upper lid blepharoplasty.   Pictures were obtained of the patient and placed in the chart with the patient's or guardian's permission.   [name], DO

## 2023-10-08 NOTE — Progress Notes (Unsigned)
 Cardiology Office Note:   Date:  10/10/2023  ID:  ZOIEY CHRISTY, DOB 20-Apr-1950, MRN [id number] PCP: Theophilus Andrews, Tully GRADE, MD  [facility] Providers Cardiologist:  Lynwood Schilling, MD Electrophysiologist:  OLE ONEIDA HOLTS, MD {  History of Present Illness:   Shelly Bowers is a 73 y.o. female who I saw in 2022 for follow up of atrial fib.  She required TEE/DCCV.  She had an EF of 30 - 35%.  She thinks that she was probably in sinus rhythm for couple of days before she went back into fibrillation.  She eventually had Watchman.   Her EF on TEE at the time of Watchman in 2024 was up to 55 - 60%.     She has done very well.  She has not noticed any atrial fibrillation.  She is not exercising as much as I would like because she is a little bit leery of developing fibrillation.   The patient denies any new symptoms such as chest discomfort, neck or arm discomfort. There has been no new shortness of breath, PND or orthopnea. There have been no reported palpitations, presyncope or syncope.   She does her household chores.    ROS: As stated in the HPI and negative for all other systems.   Studies Reviewed:    EKG:   EKG Interpretation Date/Time:  Thursday October 10 2023 09:58:13 EDT Ventricular Rate:  57 PR Interval:  132 QRS Duration:  84 QT Interval:  428 QTC Calculation: 416 R Axis:   -20  Text Interpretation: Sinus bradycardia Low voltage QRS Nonspecific ST abnormality When compared with ECG of 09-Jan-2021 14:23, No significant change since last tracing Confirmed by Schilling Lynwood ([id number]) on 10/10/2023 10:01:55 AM   ***  Risk Assessment/Calculations:    CHA2DS2-VASc Score = 5   This indicates a 7.2% annual risk of stroke. The patient's score is based upon: CHF History: 1 HTN History: 1 Diabetes History: 0 Stroke History: 0 Vascular Disease History: 0 Age Score: 2 Gender Score: 1   Physical Exam:   VS:  BP 130/78 (BP Location: Left Arm, Patient  Position: Sitting)   Pulse (!) 57   Ht 5' 5 (1.651 m)   Wt 191 lb (86.6 kg)   SpO2 96%   BMI 31.78 kg/m    Wt Readings from Last 3 Encounters:  10/10/23 191 lb (86.6 kg)  09/24/23 187 lb 12.8 oz (85.2 kg)  04/15/23 194 lb 3.2 oz (88.1 kg)     GEN: Well nourished, well developed in no acute distress NECK: No JVD; No carotid bruits CARDIAC: RRR, 2 out of 6 apical murmur radiating slightly to the axilla, no diastolic murmurs, rubs, gallops RESPIRATORY:  Clear to auscultation without rales, wheezing or rhonchi  ABDOMEN: Soft, non-tender, non-distended EXTREMITIES:  No edema; No deformity   ASSESSMENT AND PLAN:   Atrial fib with RVR:   She has had no further arrhythmias.  No change in therapy.  Acute systolic HF:    Her EF was 55 - 60% in 2024.  I will be evaluating an echo next year as a below.  MR: She had some mild MR on her TEE in 2024 and I will check this again next year.  HTN: Her blood pressure is at target.  No change in therapy.  Hyperlipidemia: LDL recently was 124.  She does have some coronary calcium .  Her primary increased her Lipitor and I agree with this and she is due to have a lipid profile  soon.  I would suggest that the goal is an LDL at least less than 100 and ideally 70  Sleep apnea: She is having this treated with a mask.  Preop: She might have eyelid surgery.  She would be at acceptable risk for the planned procedure according to ACC/AHA guidelines.     Follow up I will see her in 1 year after the echo.  Signed, Lynwood Schilling, MD

## 2023-10-09 ENCOUNTER — Other Ambulatory Visit: Payer: Self-pay | Admitting: Internal Medicine

## 2023-10-09 DIAGNOSIS — E782 Mixed hyperlipidemia: Secondary | ICD-10-CM

## 2023-10-10 ENCOUNTER — Encounter: Payer: Self-pay | Admitting: Cardiology

## 2023-10-10 ENCOUNTER — Ambulatory Visit: Attending: Cardiology | Admitting: Cardiology

## 2023-10-10 VITALS — BP 130/78 | HR 57 | Ht 65.0 in | Wt 191.0 lb

## 2023-10-10 DIAGNOSIS — I429 Cardiomyopathy, unspecified: Secondary | ICD-10-CM | POA: Diagnosis not present

## 2023-10-10 DIAGNOSIS — I340 Nonrheumatic mitral (valve) insufficiency: Secondary | ICD-10-CM

## 2023-10-10 DIAGNOSIS — I4891 Unspecified atrial fibrillation: Secondary | ICD-10-CM | POA: Diagnosis not present

## 2023-10-10 DIAGNOSIS — I4819 Other persistent atrial fibrillation: Secondary | ICD-10-CM

## 2023-10-10 DIAGNOSIS — I10 Essential (primary) hypertension: Secondary | ICD-10-CM | POA: Diagnosis not present

## 2023-10-10 DIAGNOSIS — E785 Hyperlipidemia, unspecified: Secondary | ICD-10-CM

## 2023-10-10 MED ORDER — METOPROLOL SUCCINATE ER 25 MG PO TB24: 12.5000 mg | ORAL_TABLET | Freq: Every day | ORAL | 3 refills | Status: AC

## 2023-10-10 NOTE — Patient Instructions (Signed)
 Medication Instructions:  Your physician recommends that you continue on your current medications as directed. Please refer to the Current Medication list given to you today.  *If you need a refill on your cardiac medications before your next appointment, please call your pharmacy*  Lab Work: NONE If you have labs (blood work) drawn today and your tests are completely normal, you will receive your results only by: MyChart Message (if you have MyChart) OR A paper copy in the mail If you have any lab test that is abnormal or we need to change your treatment, we will call you to review the results.  Testing/Procedures: Echocardiogram Your physician has requested that you have an echocardiogram. Echocardiography is a painless test that uses sound waves to create images of your heart. It provides your doctor with information about the size and shape of your heart and how well your heart's chambers and valves are working. This procedure takes approximately one hour. There are no restrictions for this procedure. Please do NOT wear cologne, perfume, aftershave, or lotions (deodorant is allowed). Please arrive 15 minutes prior to your appointment time.  Please note: We ask at that you not bring children with you during ultrasound (echo/ vascular) testing. Due to room size and safety concerns, children are not allowed in the ultrasound rooms during exams. Our front office staff cannot provide observation of children in our lobby area while testing is being conducted. An adult accompanying a patient to their appointment will only be allowed in the ultrasound room at the discretion of the ultrasound technician under special circumstances. We apologize for any inconvenience.   Follow-Up: At [facility], you and your health needs are our priority.  As part of our continuing mission to provide you with exceptional heart care, our providers are all part of one team.  This team includes your primary  Cardiologist (physician) and Advanced Practice Providers or APPs (Physician Assistants and Nurse Practitioners) who all work together to provide you with the care you need, when you need it.  Your next appointment:   1 year(s) after echocardiogram  Provider:   Lynwood Schilling, MD    We recommend signing up for the patient portal called MyChart.  Sign up information is provided on this After Visit Summary.  MyChart is used to connect with patients for Virtual Visits (Telemedicine).  Patients are able to view lab/test results, encounter notes, upcoming appointments, etc.  Non-urgent messages can be sent to your provider as well.   To learn more about what you can do with MyChart, go to [email or web address].

## 2023-10-16 ENCOUNTER — Other Ambulatory Visit (INDEPENDENT_AMBULATORY_CARE_PROVIDER_SITE_OTHER)

## 2023-10-16 ENCOUNTER — Ambulatory Visit: Payer: Self-pay | Admitting: Internal Medicine

## 2023-10-16 DIAGNOSIS — E782 Mixed hyperlipidemia: Secondary | ICD-10-CM

## 2023-10-16 LAB — LIPID PANEL
Cholesterol: 182 mg/dL (ref 0–200)
HDL: 64.5 mg/dL (ref >39.00)
LDL Cholesterol: 96 mg/dL (ref 0–99)
NonHDL: 117.48
Total CHOL/HDL Ratio: 3
Triglycerides: 108 mg/dL (ref 0.0–149.0)
VLDL: 21.6 mg/dL (ref 0.0–40.0)

## 2023-10-24 ENCOUNTER — Other Ambulatory Visit: Payer: Self-pay | Admitting: Internal Medicine

## 2023-10-24 ENCOUNTER — Encounter: Payer: Self-pay | Admitting: Internal Medicine

## 2023-10-24 MED ORDER — CELECOXIB 200 MG PO CAPS: 200.0000 mg | ORAL_CAPSULE | Freq: Two times a day (BID) | ORAL | 1 refills | Status: DC | PRN

## 2023-10-30 ENCOUNTER — Ambulatory Visit: Admitting: Family

## 2023-10-30 DIAGNOSIS — M25561 Pain in right knee: Secondary | ICD-10-CM

## 2023-10-30 NOTE — Progress Notes (Signed)
 Office Visit Note   Patient: Shelly Bowers           Date of Birth: 01/01/1951           MRN: [id number] Visit Date: 10/30/2023              Requested by: Theophilus Andrews, Tully GRADE, MD [address] [place],  [place] [address] PCP: Theophilus Andrews, Tully GRADE, MD  Chief Complaint  Patient presents with   Right Knee - Pain    S/p injection 07/31/2023      HPI: The patient is a 72 year old woman who presents today for concern of ongoing right knee pain.  Continues to have lateral knee pain and some clicking at times.  Difficulty getting from a seated to a standing position  Last seen in the office for the same May 7 of this year Depo-Medrol  injection at that time this provided her with adequate relief  Assessment & Plan: Visit Diagnoses: No diagnosis found.  Plan: Depo-Medrol  injection right knee..  Patient tolerated well.  She will follow-up in the office as needed.  Could consider MRI  Follow-Up Instructions: No follow-ups on file.   Right Knee Exam   Tenderness  The patient is experiencing tenderness in the medial joint line and lateral joint line.  Range of Motion  The patient has normal right knee ROM.  Tests  Varus: negative Valgus: negative  Other  Erythema: absent Effusion: no effusion present      Patient is alert, oriented, no adenopathy, well-dressed, normal affect, normal respiratory effort.    Imaging: No results found. No images are attached to the encounter.  Labs: Lab Results  Component Value Date   HGBA1C 5.5 04/11/2022   HGBA1C 5.7 02/10/2021     Lab Results  Component Value Date   ALBUMIN 4.5 04/15/2023   ALBUMIN 4.5 04/11/2022   ALBUMIN 4.6 05/31/2021    Lab Results  Component Value Date   MG 2.0 09/01/2020   MG 1.6 (L) 08/26/2020   MG 2.0 08/23/2020   Lab Results  Component Value Date   VD25OH 45.44 04/15/2023   VD25OH 37.45 10/10/2022   VD25OH 35.43 07/17/2022    No results found for: PREALBUMIN     Latest Ref Rng & Units 04/15/2023    9:44 AM 06/25/2022   11:23 AM 04/11/2022   10:59 AM  CBC EXTENDED  WBC 4.0 - 10.5 K/uL 6.9  4.9  4.8   RBC 3.87 - 5.11 Mil/uL 3.57  3.53  3.64   Hemoglobin 12.0 - 15.0 g/dL 87.5  88.1  87.3   HCT 36.0 - 46.0 % 37.6  36.4  36.9   Platelets 150.0 - 400.0 K/uL 176.0  197  187.0   NEUT# 1.4 - 7.7 K/uL 4.9   2.6   Lymph# 0.7 - 4.0 K/uL 1.4   1.7      There is no height or weight on file to calculate BMI.  Orders:  No orders of the defined types were placed in this encounter.  No orders of the defined types were placed in this encounter.    Procedures: Large Joint Inj: R knee on 10/30/2023 2:22 PM Indications: pain Details: 18 G 1.5 in needle, anteromedial approach Medications: 5 mL lidocaine  1 %; 40 mg methylPREDNISolone  acetate 40 MG/ML Consent was given by the patient.      Clinical Data: No additional findings.  ROS:  All other systems negative, except as noted in the HPI. Review of Systems  Objective: Vital Signs: There were no vitals taken for this visit.  Specialty Comments:  EXAM: MRI LUMBAR SPINE WITHOUT CONTRAST   TECHNIQUE: Multiplanar, multisequence MR imaging of the lumbar spine was performed. No intravenous contrast was administered.   COMPARISON:  12/28/2008   FINDINGS: The vertebral bodies of the lumbar spine are normal in size. The vertebral bodies of the lumbar spine are normal in alignment. There is normal bone marrow signal demonstrated throughout the vertebra. There is degenerative disc disease with disc height loss at L1-2 and L4-5.   The spinal cord is normal in signal and contour. The cord terminates normally at L1 . The nerve roots of the cauda equina and the filum terminale are normal.   The visualized portions of the SI joints are unremarkable.   The imaged intra-abdominal contents are unremarkable.   T12-L1: No significant disc bulge. No evidence of neural foraminal stenosis. No central canal  stenosis.   L1-L2: Mild broad-based disc bulge. Mild right facet arthropathy. Moderate left facet arthropathy and left lateral recess stenosis. Moderate left foraminal stenosis.   L2-L3: Mild broad-based disc bulge. Mild bilateral facet arthropathy. Left lateral recess stenosis. No evidence of neural foraminal stenosis. No central canal stenosis.   L3-L4: Mild broad-based disc bulge. Moderate bilateral facet arthropathy with lateral recess stenosis and mild central canal stenosis. No evidence of neural foraminal stenosis.   L4-L5: Mild broad-based disc bulge. Severe right and mild left facet arthropathy. Mild right foraminal stenosis. No left foraminal stenosis.   L5-S1: Mild broad-based disc bulge. No evidence of neural foraminal stenosis. No central canal stenosis.   IMPRESSION: 1. At L1-2 there is mild broad-based disc bulge. Mild right facet arthropathy. Moderate left facet arthropathy and left lateral recess stenosis. Moderate left foraminal stenosis. 2. At L3-4 there is mild broad-based disc bulge. Moderate bilateral facet arthropathy with lateral recess stenosis and mild central canal stenosis. 3. At L4-5 with a mild broad-based disc bulge. Severe right and mild left facet arthropathy. Mild right foraminal stenosis.     Electronically Signed   By: Julaine Blanch   On: 04/28/2015 10:01  PMFS History: Patient Active Problem List   Diagnosis Date Noted   Dermatochalasis of both eyelids 09/24/2023   Atrial fibrillation (HCC) 07/12/2022   Presence of Watchman left atrial appendage closure device 07/12/2022   Dilated cardiomyopathy (HCC) 12/13/2021   Snoring 12/13/2021   Nocturia 12/13/2021   Severe obstructive sleep apnea-hypopnea syndrome 12/13/2021   Hypothyroidism 08/17/2021   Secondary hypercoagulable state (HCC) 12/19/2020   Acute systolic HF (heart failure) (HCC) 09/18/2020   Hypomagnesemia 09/18/2020   Hypokalemia 09/18/2020   Persistent atrial fibrillation  (HCC) 08/24/2020   Atrial fibrillation with RVR (HCC) 08/23/2020   Macrocytosis without anemia 01/26/2020   Vitamin D  deficiency 01/26/2020   SCC (squamous cell carcinoma) 04/25/2018   Hyperlipidemia 02/28/2018   Osteopenia 02/28/2018   Essential hypertension 10/02/2017   Chronic sciatica 08/21/2017   Menopausal symptom 08/21/2017   Granuloma of skin 01/04/2016   Dyslipidemia 04/20/2013   Encounter for preventive health examination 03/15/2011   HIP PAIN, LEFT 10/28/2008   Past Medical History:  Diagnosis Date   Atrial fibrillation (HCC)    Cardiomyopathy (HCC)    HTN (hypertension)    Hyperlipidemia    Osteopenia    Presence of Watchman left atrial appendage closure device 07/12/2022   31mm Watchman FLX with Dr. Cindie    Family History  Problem Relation Age of Onset   Breast cancer Mother 17  Atrial fibrillation Father     Past Surgical History:  Procedure Laterality Date   ATRIAL FIBRILLATION ABLATION N/A 11/21/2020   Procedure: ATRIAL FIBRILLATION ABLATION;  Surgeon: Cindie Ole DASEN, MD;  Location: [facility];  Service: Cardiovascular;  Laterality: N/A;   AUGMENTATION MAMMAPLASTY Bilateral    BREAST EXCISIONAL BIOPSY Left    BUBBLE STUDY  08/25/2020   Procedure: BUBBLE STUDY;  Surgeon: Santo Stanly LABOR, MD;  Location: [facility];  Service: Cardiovascular;;   CARDIOVERSION N/A 08/25/2020   Procedure: CARDIOVERSION;  Surgeon: Santo Stanly LABOR, MD;  Location: [facility];  Service: Cardiovascular;  Laterality: N/A;   CARDIOVERSION N/A 12/26/2020   Procedure: CARDIOVERSION;  Surgeon: Santo Stanly LABOR, MD;  Location: [facility];  Service: Cardiovascular;  Laterality: N/A;   INCISION AND DRAINAGE ABSCESS Left 12/27/2015   Procedure: INCISION AND DRAINAGE ABSCESS;  Surgeon: Arley Curia, MD;  Location: [facility];  Service: Orthopedics;  Laterality: Left;   LEFT ATRIAL APPENDAGE OCCLUSION N/A 07/12/2022   Procedure: LEFT ATRIAL  APPENDAGE OCCLUSION;  Surgeon: Cindie Ole DASEN, MD;  Location: [facility];  Service: Cardiovascular;  Laterality: N/A;   TEE WITHOUT CARDIOVERSION N/A 08/25/2020   Procedure: TRANSESOPHAGEAL ECHOCARDIOGRAM (TEE);  Surgeon: Santo Stanly LABOR, MD;  Location[facility] ENDOSCOPY;  Service: Cardiovascular;  Laterality: N/A;   TEE WITHOUT CARDIOVERSION N/A 07/12/2022   Procedure: TRANSESOPHAGEAL ECHOCARDIOGRAM;  Surgeon: Cindie Ole DASEN, MD;  Location: [facility] INVASIVE CV LAB;  Service: Cardiovascular;  Laterality: N/A;   Social History   Occupational History   Not on file  Tobacco Use   Smoking status: Never   Smokeless tobacco: Never  Substance and Sexual Activity   Alcohol use: Yes    Alcohol/week: 14.0 standard drinks of alcohol    Types: 14 Glasses of wine per week    Comment: 1 glass of wine nightly   Drug use: No   Sexual activity: Not on file

## 2023-11-01 DIAGNOSIS — H02831 Dermatochalasis of right upper eyelid: Secondary | ICD-10-CM | POA: Diagnosis not present

## 2023-11-01 DIAGNOSIS — H02834 Dermatochalasis of left upper eyelid: Secondary | ICD-10-CM | POA: Diagnosis not present

## 2023-11-05 ENCOUNTER — Encounter: Payer: Self-pay | Admitting: Family

## 2023-11-12 ENCOUNTER — Telehealth: Payer: Self-pay | Admitting: Plastic Surgery

## 2023-11-12 ENCOUNTER — Ambulatory Visit (INDEPENDENT_AMBULATORY_CARE_PROVIDER_SITE_OTHER): Payer: Self-pay | Admitting: Plastic Surgery

## 2023-11-12 DIAGNOSIS — H02834 Dermatochalasis of left upper eyelid: Secondary | ICD-10-CM

## 2023-11-12 DIAGNOSIS — H02831 Dermatochalasis of right upper eyelid: Secondary | ICD-10-CM

## 2023-11-12 NOTE — Progress Notes (Signed)
 Patient is is scheduled to have the visual field exam on August 25.  Will set up a televisit to talk once we get that.

## 2023-11-12 NOTE — Telephone Encounter (Signed)
 she canceled the 8-25 apt with [facility] and went to [facility], Im calling them now to get her records

## 2023-11-12 NOTE — Telephone Encounter (Signed)
 called [facility] was on hold a min then was sent to office vmail let a detailed vmail requesting the VFE , can call pt once we rec VFE  Please keep open for me

## 2023-11-12 NOTE — Progress Notes (Deleted)
   Subjective:    Patient ID: Shelly Bowers, female    DOB: [birth year], 74 y.o.   MRN: [id number]  HPI    Review of Systems     Objective:   Physical Exam        Assessment & Plan:

## 2023-11-19 DIAGNOSIS — G4733 Obstructive sleep apnea (adult) (pediatric): Secondary | ICD-10-CM | POA: Diagnosis not present

## 2023-11-22 MED ORDER — LIDOCAINE HCL 1 % IJ SOLN
5.0000 mL | INTRAMUSCULAR | Status: AC | PRN
  Administered 2023-10-30: 5 mL

## 2023-11-22 MED ORDER — METHYLPREDNISOLONE ACETATE 40 MG/ML IJ SUSP
40.0000 mg | INTRAMUSCULAR | Status: AC | PRN
  Administered 2023-10-30: 40 mg via INTRA_ARTICULAR

## 2023-11-26 ENCOUNTER — Telehealth: Payer: Self-pay | Admitting: Family

## 2023-11-26 NOTE — Telephone Encounter (Signed)
 Patient called and wanted to know who did Erin send her too  to do the MRI? She think they called her but wasn't sure. CB#[phone]

## 2023-11-27 NOTE — Telephone Encounter (Signed)
 Pt informed. [facility] is who called her.

## 2023-12-02 ENCOUNTER — Encounter: Payer: Self-pay | Admitting: Family

## 2023-12-03 ENCOUNTER — Telehealth: Payer: Self-pay | Admitting: Plastic Surgery

## 2023-12-03 NOTE — Telephone Encounter (Signed)
 Pt had a f/u from VFT, there is a VFT in the pt shart from [facility], I do not see a referral route for sx, Pt just called back asking an update on ins, Dr D are you able to put in a route for sx please

## 2023-12-05 ENCOUNTER — Ambulatory Visit
Admission: RE | Admit: 2023-12-05 | Discharge: 2023-12-05 | Disposition: A | Discharge status: Home / Self Care | Source: Ambulatory Visit | Attending: Family | Admitting: Family

## 2023-12-05 ENCOUNTER — Other Ambulatory Visit

## 2023-12-05 DIAGNOSIS — M1711 Unilateral primary osteoarthritis, right knee: Secondary | ICD-10-CM | POA: Diagnosis not present

## 2023-12-05 DIAGNOSIS — M25561 Pain in right knee: Secondary | ICD-10-CM

## 2023-12-05 DIAGNOSIS — M23611 Other spontaneous disruption of anterior cruciate ligament of right knee: Secondary | ICD-10-CM | POA: Diagnosis not present

## 2023-12-06 ENCOUNTER — Telehealth: Payer: Self-pay | Admitting: Orthopedic Surgery

## 2023-12-06 DIAGNOSIS — H02831 Dermatochalasis of right upper eyelid: Secondary | ICD-10-CM

## 2023-12-06 NOTE — Telephone Encounter (Signed)
 Called pt and left vm for pt to set an MRI review appt with [facility] 1 to 2 wks after 9/11

## 2023-12-16 ENCOUNTER — Ambulatory Visit: Admitting: Orthopedic Surgery

## 2023-12-16 ENCOUNTER — Encounter: Payer: Self-pay | Admitting: Orthopedic Surgery

## 2023-12-16 DIAGNOSIS — M1711 Unilateral primary osteoarthritis, right knee: Secondary | ICD-10-CM

## 2023-12-16 DIAGNOSIS — M25561 Pain in right knee: Secondary | ICD-10-CM | POA: Diagnosis not present

## 2023-12-16 NOTE — Progress Notes (Signed)
 Office Visit Note   Patient: Shelly Bowers           Date of Birth: 01-16-1951           MRN: [id number] Visit Date: 12/16/2023              Requested by: Theophilus Andrews, Tully GRADE, MD [address] [place],  [place] [address] PCP: Theophilus Andrews, Tully GRADE, MD  Chief Complaint  Patient presents with   Right Knee - Follow-up      HPI: Discussed the use of AI scribe software for clinical note transcription with the patient, who gave verbal consent to proceed.  History of Present Illness Shelly Bowers is a 73 year old female who presents with knee pain and functional limitations.  She has significant arthritis in her knee. She reports that she cannot perform desired activities due to her knee condition.  She has received two injections previously, with the first lasting about three months and the second requiring a repeat after six weeks. She mentions that she can receive these injections every three months, which helps manage her symptoms.  She recalls two falls, one on linoleum and another on carpeted steps, which she believes contributed to her knee issues. These incidents occurred since the pandemic began.  Her husband has undergone multiple knee replacements, which she references in discussing her own potential future treatment options.     Assessment & Plan: Visit Diagnoses:  1. Acute pain of right knee   2. 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 MRI showed wear and tear without acute tears. Symptoms manageable with injections and exercises. - Continue knee injections every three months or as needed. - Encourage strengthening exercises to maintain knee function. - Consider total knee arthroplasty if daily activities become  significantly impaired. Discussed potential complications.      Follow-Up Instructions: No follow-ups on file.   Ortho Exam  Patient is alert, oriented, no adenopathy, well-dressed, normal affect, normal respiratory effort. Physical Exam MUSCULOSKELETAL: Slight antalgic gait, no effusion, cruciates stable.  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  Review of the MRI scan shows tricompartmental arthritic changes with chronic meniscal tearing.      Imaging: No results found. No images are attached to the encounter.  Labs: Lab Results  Component Value Date   HGBA1C 5.5 04/11/2022   HGBA1C 5.7 02/10/2021     Lab Results  Component Value Date   ALBUMIN 4.5 04/15/2023   ALBUMIN 4.5 04/11/2022   ALBUMIN 4.6 05/31/2021    Lab Results  Component Value Date   MG 2.0 09/01/2020   MG 1.6 (L) 08/26/2020   MG 2.0 08/23/2020   Lab Results  Component Value Date   VD25OH 45.44 04/15/2023   VD25OH 37.45 10/10/2022   VD25OH 35.43 07/17/2022    No results found for: PREALBUMIN    Latest Ref Rng & Units 04/15/2023    9:44 AM 06/25/2022   11:23 AM 04/11/2022   10:59 AM  CBC EXTENDED  WBC 4.0 - 10.5 K/uL 6.9  4.9  4.8   RBC 3.87 - 5.11 Mil/uL 3.57  3.53  3.64   Hemoglobin 12.0 - 15.0 g/dL 87.5  88.1  87.3   HCT 36.0 - 46.0 % 37.6  36.4  36.9  Platelets 150.0 - 400.0 K/uL 176.0  197  187.0   NEUT# 1.4 - 7.7 K/uL 4.9   2.6   Lymph# 0.7 - 4.0 K/uL 1.4   1.7      There is no height or weight on file to calculate BMI.  Orders:  No orders of the defined types were placed in this encounter.  No orders of the defined types were placed in this encounter.    Procedures: No procedures performed  Clinical Data: No additional findings.  ROS:  All other systems negative, except as noted in the HPI. Review of Systems  Objective: Vital Signs: There were no vitals taken for this visit.  Specialty  Comments:  EXAM: MRI LUMBAR SPINE WITHOUT CONTRAST   TECHNIQUE: Multiplanar, multisequence MR imaging of the lumbar spine was performed. No intravenous contrast was administered.   COMPARISON:  12/28/2008   FINDINGS: The vertebral bodies of the lumbar spine are normal in size. The vertebral bodies of the lumbar spine are normal in alignment. There is normal bone marrow signal demonstrated throughout the vertebra. There is degenerative disc disease with disc height loss at L1-2 and L4-5.   The spinal cord is normal in signal and contour. The cord terminates normally at L1 . The nerve roots of the cauda equina and the filum terminale are normal.   The visualized portions of the SI joints are unremarkable.   The imaged intra-abdominal contents are unremarkable.   T12-L1: No significant disc bulge. No evidence of neural foraminal stenosis. No central canal stenosis.   L1-L2: Mild broad-based disc bulge. Mild right facet arthropathy. Moderate left facet arthropathy and left lateral recess stenosis. Moderate left foraminal stenosis.   L2-L3: Mild broad-based disc bulge. Mild bilateral facet arthropathy. Left lateral recess stenosis. No evidence of neural foraminal stenosis. No central canal stenosis.   L3-L4: Mild broad-based disc bulge. Moderate bilateral facet arthropathy with lateral recess stenosis and mild central canal stenosis. No evidence of neural foraminal stenosis.   L4-L5: Mild broad-based disc bulge. Severe right and mild left facet arthropathy. Mild right foraminal stenosis. No left foraminal stenosis.   L5-S1: Mild broad-based disc bulge. No evidence of neural foraminal stenosis. No central canal stenosis.   IMPRESSION: 1. At L1-2 there is mild broad-based disc bulge. Mild right facet arthropathy. Moderate left facet arthropathy and left lateral recess stenosis. Moderate left foraminal stenosis. 2. At L3-4 there is mild broad-based disc bulge. Moderate  bilateral facet arthropathy with lateral recess stenosis and mild central canal stenosis. 3. At L4-5 with a mild broad-based disc bulge. Severe right and mild left facet arthropathy. Mild right foraminal stenosis.     Electronically Signed   By: Julaine Blanch   On: 04/28/2015 10:01  PMFS History: Patient Active Problem List   Diagnosis Date Noted   Dermatochalasis of both eyelids 09/24/2023   Atrial fibrillation (HCC) 07/12/2022   Presence of Watchman left atrial appendage closure device 07/12/2022   Dilated cardiomyopathy (HCC) 12/13/2021   Snoring 12/13/2021   Nocturia 12/13/2021   Severe obstructive sleep apnea-hypopnea syndrome 12/13/2021   Hypothyroidism 08/17/2021   Secondary hypercoagulable state (HCC) 12/19/2020   Acute systolic HF (heart failure) (HCC) 09/18/2020   Hypomagnesemia 09/18/2020   Hypokalemia 09/18/2020   Persistent atrial fibrillation (HCC) 08/24/2020   Atrial fibrillation with RVR (HCC) 08/23/2020   Macrocytosis without anemia 01/26/2020   Vitamin D  deficiency 01/26/2020   SCC (squamous cell carcinoma) 04/25/2018   Hyperlipidemia 02/28/2018   Osteopenia 02/28/2018   Essential hypertension 10/02/2017  Chronic sciatica 08/21/2017   Menopausal symptom 08/21/2017   Granuloma of skin 01/04/2016   Dyslipidemia 04/20/2013   Encounter for preventive health examination 03/15/2011   HIP PAIN, LEFT 10/28/2008   Past Medical History:  Diagnosis Date   Atrial fibrillation (HCC)    Cardiomyopathy (HCC)    HTN (hypertension)    Hyperlipidemia    Osteopenia    Presence of Watchman left atrial appendage closure device 07/12/2022   31mm Watchman FLX with Dr. Cindie    Family History  Problem Relation Age of Onset   Breast cancer Mother 53   Atrial fibrillation Father     Past Surgical History:  Procedure Laterality Date   ATRIAL FIBRILLATION ABLATION N/A 11/21/2020   Procedure: ATRIAL FIBRILLATION ABLATION;  Surgeon: Cindie Ole DASEN, MD;  Location:  [facility];  Service: Cardiovascular;  Laterality: N/A;   AUGMENTATION MAMMAPLASTY Bilateral    BREAST EXCISIONAL BIOPSY Left    BUBBLE STUDY  08/25/2020   Procedure: BUBBLE STUDY;  Surgeon: Santo Stanly LABOR, MD;  Location: [facility];  Service: Cardiovascular;;   CARDIOVERSION N/A 08/25/2020   Procedure: CARDIOVERSION;  Surgeon: Santo Stanly LABOR, MD;  Location: [facility];  Service: Cardiovascular;  Laterality: N/A;   CARDIOVERSION N/A 12/26/2020   Procedure: CARDIOVERSION;  Surgeon: Santo Stanly LABOR, MD;  Location: [facility];  Service: Cardiovascular;  Laterality: N/A;   INCISION AND DRAINAGE ABSCESS Left 12/27/2015   Procedure: INCISION AND DRAINAGE ABSCESS;  Surgeon: Arley Curia, MD;  Location: [facility];  Service: Orthopedics;  Laterality: Left;   LEFT ATRIAL APPENDAGE OCCLUSION N/A 07/12/2022   Procedure: LEFT ATRIAL APPENDAGE OCCLUSION;  Surgeon: Cindie Ole DASEN, MD;  Location: [facility];  Service: Cardiovascular;  Laterality: N/A;   TEE WITHOUT CARDIOVERSION N/A 08/25/2020   Procedure: TRANSESOPHAGEAL ECHOCARDIOGRAM (TEE);  Surgeon: Santo Stanly LABOR, MD;  Location: [facility]OS[facility];  Service: Cardiovascular;  Laterality: N/A;   TEE WITHOUT CARDIOVERSION N/A 07/12/2022   Procedure: TRANSESOPHAGEAL ECHOCARDIOGRAM;  Surgeon: Cindie Ole DASEN, MD;  Location: [facility];  Service: Cardiovascular;  Laterality: N/A;   Social History   Occupational History   Not on file  Tobacco Use   Smoking status: Never   Smokeless tobacco: Never  Substance and Sexual Activity   Alcohol use: Yes    Alcohol/week: 14.0 standard drinks of alcohol    Types: 14 Glasses of wine per week    Comment: 1 glass of wine nightly   Drug use: No   Sexual activity: Not on file

## 2023-12-18 ENCOUNTER — Other Ambulatory Visit: Payer: Self-pay | Admitting: Internal Medicine

## 2023-12-18 DIAGNOSIS — E039 Hypothyroidism, unspecified: Secondary | ICD-10-CM

## 2024-01-01 ENCOUNTER — Telehealth: Payer: Self-pay

## 2024-01-01 ENCOUNTER — Telehealth: Payer: Self-pay | Admitting: Cardiology

## 2024-01-01 ENCOUNTER — Telehealth (HOSPITAL_BASED_OUTPATIENT_CLINIC_OR_DEPARTMENT_OTHER): Payer: Self-pay | Admitting: *Deleted

## 2024-01-01 ENCOUNTER — Encounter: Payer: Self-pay | Admitting: Internal Medicine

## 2024-01-01 ENCOUNTER — Ambulatory Visit: Admitting: Student

## 2024-01-01 ENCOUNTER — Encounter: Payer: Self-pay | Admitting: Student

## 2024-01-01 VITALS — BP 148/85 | HR 57 | Ht 65.0 in | Wt 189.0 lb

## 2024-01-01 DIAGNOSIS — H02834 Dermatochalasis of left upper eyelid: Secondary | ICD-10-CM | POA: Diagnosis not present

## 2024-01-01 DIAGNOSIS — H02831 Dermatochalasis of right upper eyelid: Secondary | ICD-10-CM | POA: Diagnosis not present

## 2024-01-01 MED ORDER — TRAMADOL HCL 50 MG PO TABS: 50.0000 mg | ORAL_TABLET | Freq: Three times a day (TID) | ORAL | 0 refills | Status: DC | PRN

## 2024-01-01 MED ORDER — CEPHALEXIN 500 MG PO CAPS
500.0000 mg | ORAL_CAPSULE | Freq: Four times a day (QID) | ORAL | 0 refills | Status: AC
Start: 2024-01-01 — End: 2024-01-04

## 2024-01-01 NOTE — Addendum Note (Signed)
 Addended by: ANDRIS STAGGER on: 01/01/2024 12:47 PM   Modules accepted: Level of Service

## 2024-01-01 NOTE — Telephone Encounter (Signed)
   Pre-operative Risk Assessment    Patient Name: Shelly Bowers  DOB: [birth year] MRN: [id number]   Date of last office visit: 10/10/23 DR. HOCHREIN Date of next office visit: NONE   Request for Surgical Clearance    Procedure:  BLEPHAROPLASTY   Date of Surgery:  Clearance 01/23/24  STAT                              Surgeon:  DR. ESTEFANA FRITTER Surgeon's Group or Practice Name:  [facility]  Phone number:  [phone] Fax number:  [phone]   Type of Clearance Requested:   - Medical ; NONE INDICATED   Type of Anesthesia:  General    Additional requests/questions:    [name]   01/01/2024, 9:51 AM

## 2024-01-01 NOTE — Telephone Encounter (Signed)
 *  STAT* If patient is at the pharmacy, call can be transferred to refill team.   1. Which medications need to be refilled? (please list name of each medication and dose if known)   sacubitril -valsartan  (ENTRESTO ) 24-26 MG     2. Would you like to learn more about the convenience, safety, & potential cost savings by using the [facility]? No      3. Are you open to using the [facility] (Type [facility]. ). No    4. Which pharmacy/location (including street and city if local pharmacy) is medication to be sent to?[id number] - [place], [place] - [address]    5. Do they need a 30 day or 90 day supply? 90 days    Patient need refill only have 1 pill left

## 2024-01-01 NOTE — Telephone Encounter (Signed)
 Med Rec and Consent done    Patient Consent for Virtual Visit        Shelly Bowers has provided verbal consent on 01/01/2024 for a virtual visit (video or telephone).   CONSENT FOR VIRTUAL VISIT FOR:  Shelly Bowers  By participating in this virtual visit I agree to the following:  I hereby voluntarily request, consent and authorize [facility] and its employed or contracted physicians, physician assistants, nurse practitioners or other licensed health care professionals (the Practitioner), to provide me with telemedicine health care services (the "Services) as deemed necessary by the treating Practitioner. I acknowledge and consent to receive the Services by the Practitioner via telemedicine. I understand that the telemedicine visit will involve communicating with the Practitioner through live audiovisual communication technology and the disclosure of certain medical information by electronic transmission. I acknowledge that I have been given the opportunity to request an in-person assessment or other available alternative prior to the telemedicine visit and am voluntarily participating in the telemedicine visit.  I understand that I have the right to withhold or withdraw my consent to the use of telemedicine in the course of my care at any time, without affecting my right to future care or treatment, and that the Practitioner or I may terminate the telemedicine visit at any time. I understand that I have the right to inspect all information obtained and/or recorded in the course of the telemedicine visit and may receive copies of available information for a reasonable fee.  I understand that some of the potential risks of receiving the Services via telemedicine include:  Delay or interruption in medical evaluation due to technological equipment failure or disruption; Information transmitted may not be sufficient (e.g. poor resolution of images) to allow for appropriate  medical decision making by the Practitioner; and/or  In rare instances, security protocols could fail, causing a breach of personal health information.  Furthermore, I acknowledge that it is my responsibility to provide information about my medical history, conditions and care that is complete and accurate to the best of my ability. I acknowledge that Practitioner's advice, recommendations, and/or decision may be based on factors not within their control, such as incomplete or inaccurate data provided by me or distortions of diagnostic images or specimens that may result from electronic transmissions. I understand that the practice of medicine is not an exact science and that Practitioner makes no warranties or guarantees regarding treatment outcomes. I acknowledge that a copy of this consent can be made available to me via my patient portal [facility] MyChart), or I can request a printed copy by calling the office of [facility].    I understand that my insurance will be billed for this visit.   I have read or had this consent read to me. I understand the contents of this consent, which adequately explains the benefits and risks of the Services being provided via telemedicine.  I have been provided ample opportunity to ask questions regarding this consent and the Services and have had my questions answered to my satisfaction. I give my informed consent for the services to be provided through the use of telemedicine in my medical care

## 2024-01-01 NOTE — Progress Notes (Signed)
 Patient ID: Shelly Bowers, female    DOB: [birth year], 73 y.o.   MRN: [id number]  Chief Complaint  Patient presents with   Pre-op Exam     PreOP: Bil bleph / Ins / Sx 01-23-24       ICD-10-CM   1. Dermatochalasis of both upper eyelids  H02.831    H02.834        History of Present Illness: Shelly Bowers is a 73 y.o.  female  with a history of dermatochalasis of the eyelids.  She presents for preoperative evaluation for upcoming procedure, blepharoplasty, scheduled for 01/23/2024 with Dr. Lowery.  The patient has not had problems with anesthesia.  Patient states that she does see a cardiologist and electrophysiologist as she did have a Watchman device placed.  She states that she is not taking any blood thinners since the watchman was placed.  She reports she is not a smoker.  Patient denies taking any birth control or hormone replacement.  She denies any history of miscarriages.  She denies any personal family history of blood clots or clotting diseases.  She denies any recent surgeries, traumas or infections.  She denies any history of stroke or heart attack.  She denies any history of Crohn's disease or ulcerative colitis, COPD or asthma.  She denies any history of cancer.  She denies any varicosities to her lower extremities.  She denies any recent fevers, chills or changes in her health.  Patient states that given her history of CHF and A-fib status post several ablations and Watchman procedure, Dr. Cindie her electrophysiologist told her that she cannot have epinephrine.  Summary of Previous Visit: Patient was seen for initial consult by Dr. Lowery on 09/24/2023.  At this visit, patient complained of trouble with her visual field.  Patient reported that she had a cardiac procedure and stated that she could not get epinephrine.  She did not have any other complaints.  Plan was to send the patient for a visual field exam.  Tentative plan was for bilateral upper lid  blepharoplasty.  It was noted that epinephrine would be avoided.  Job: Works part-time as a Conservation officer, nature, planning to take 1 week off  PMH Significant for: Hypertension, atrial fibrillation status post Watchman device placement, acute systolic heart failure, dilated cardiomyopathy, OSA, dermatochalasis   Past Medical History: Allergies: Allergies  Allergen Reactions   Codeine Nausea Only   Hydrocodone  Nausea And Vomiting    Current Medications:  Current Outpatient Medications:    atorvastatin  (LIPITOR) 40 MG tablet, TAKE 1 TABLET BY MOUTH EVERY DAY, Disp: 90 tablet, Rfl: 1   CALCIUM -VITAMIN D  PO, Take 1 tablet by mouth daily., Disp: , Rfl:    celecoxib  (CELEBREX ) 200 MG capsule, Take 1 capsule (200 mg total) by mouth 2 (two) times daily as needed. TAKE 1 CAPSULE BY MOUTH 2 TIMES DAILY AS NEEDED, Disp: 60 capsule, Rfl: 1   cyanocobalamin  (VITAMIN B12) 1000 MCG tablet, Take 1,000 mcg by mouth daily., Disp: , Rfl:    folic acid (FOLVITE) 800 MCG tablet, Take 800 mcg by mouth daily., Disp: , Rfl:    furosemide  (LASIX ) 20 MG tablet, Take 1 tablet (20 mg total) by mouth daily as needed for fluid., Disp: 30 tablet, Rfl: 8   ibuprofen (ADVIL,MOTRIN) 200 MG tablet, Take 400 mg by mouth every 6 (six) hours as needed for headache, fever or mild pain., Disp: , Rfl:    levothyroxine  (SYNTHROID ) 50 MCG tablet, TAKE 1 TABLET BY MOUTH  DAILY BEFORE BREAKFAST, Disp: 90 tablet, Rfl: 1   metoprolol  succinate (TOPROL -XL) 25 MG 24 hr tablet, Take 0.5 tablets (12.5 mg total) by mouth daily., Disp: 45 tablet, Rfl: 3   metroNIDAZOLE (METROGEL) 0.75 % gel, Apply 1 application topically at bedtime., Disp: , Rfl:    naphazoline-pheniramine (VISINE) 0.025-0.3 % ophthalmic solution, Place 1 drop into both eyes as needed for eye irritation., Disp: , Rfl:    sacubitril -valsartan  (ENTRESTO ) 24-26 MG, TAKE 1 TABLET BY MOUTH TWICE A DAY, Disp: 180 tablet, Rfl: 3   vitamin E 180 MG (400 UNITS) capsule, Take 400 Units by mouth  daily., Disp: , Rfl:   Past Medical Problems: Past Medical History:  Diagnosis Date   Atrial fibrillation (HCC)    Cardiomyopathy (HCC)    HTN (hypertension)    Hyperlipidemia    Osteopenia    Presence of Watchman left atrial appendage closure device 07/12/2022   31mm Watchman FLX with Dr. Cindie    Past Surgical History: Past Surgical History:  Procedure Laterality Date   ATRIAL FIBRILLATION ABLATION N/A 11/21/2020   Procedure: ATRIAL FIBRILLATION ABLATION;  Surgeon: Cindie Ole DASEN, MD;  Location: [facility];  Service: Cardiovascular;  Laterality: N/A;   AUGMENTATION MAMMAPLASTY Bilateral    BREAST EXCISIONAL BIOPSY Left    BUBBLE STUDY  08/25/2020   Procedure: BUBBLE STUDY;  Surgeon: Santo Stanly LABOR, MD;  Location: [facility];  Service: Cardiovascular;;   CARDIOVERSION N/A 08/25/2020   Procedure: CARDIOVERSION;  Surgeon: Santo Stanly LABOR, MD;  Location: [facility];  Service: Cardiovascular;  Laterality: N/A;   CARDIOVERSION N/A 12/26/2020   Procedure: CARDIOVERSION;  Surgeon: Santo Stanly LABOR, MD;  Location: [facility];  Service: Cardiovascular;  Laterality: N/A;   INCISION AND DRAINAGE ABSCESS Left 12/27/2015   Procedure: INCISION AND DRAINAGE ABSCESS;  Surgeon: Arley Curia, MD;  Location: [facility];  Service: Orthopedics;  Laterality: Left;   LEFT ATRIAL APPENDAGE OCCLUSION N/A 07/12/2022   Procedure: LEFT ATRIAL APPENDAGE OCCLUSION;  Surgeon: Cindie Ole DASEN, MD;  Location: [facility];  Service: Cardiovascular;  Laterality: N/A;   TEE WITHOUT CARDIOVERSION N/A 08/25/2020   Procedure: TRANSESOPHAGEAL ECHOCARDIOGRAM (TEE);  Surgeon: Santo Stanly LABOR, MD;  Location: [facility] ENDOSCOPY;  Service: Cardiovascular;  Laterality: N/A;   TEE WITHOUT CARDIOVERSION N/A 07/12/2022   Procedure: TRANSESOPHAGEAL ECHOCARDIOGRAM;  Surgeon: Cindie Ole DASEN, MD;  Location: [facility] INVASIVE CV LAB;  Service: Cardiovascular;  Laterality: N/A;     Social History: Social History   Socioeconomic History   Marital status: Married    Spouse name: Not on file   Number of children: Not on file   Years of education: Not on file   Highest education level: Not on file  Occupational History   Not on file  Tobacco Use   Smoking status: Never   Smokeless tobacco: Never  Substance and Sexual Activity   Alcohol use: Yes    Alcohol/week: 14.0 standard drinks of alcohol    Types: 14 Glasses of wine per week    Comment: 1 glass of wine nightly   Drug use: No   Sexual activity: Not on file  Other Topics Concern   Not on file  Social History Narrative   Left handed   Caffeine use: tea sometimes   Soda-zero sugar/no caffeine ginger ale   1-2 cups coffee per day (decaf)   Social Drivers of Health   Financial Resource Strain: Low Risk  (04/11/2023)   Overall Financial Resource Strain (CARDIA)  Difficulty of Paying Living Expenses: Not hard at all  Food Insecurity: No Food Insecurity (04/11/2023)   Hunger Vital Sign    Worried About Running Out of Food in the Last Year: Never true    Ran Out of Food in the Last Year: Never true  Transportation Needs: No Transportation Needs (04/11/2023)   PRAPARE - Administrator, Civil Service (Medical): No    Lack of Transportation (Non-Medical): No  Physical Activity: Sufficiently Active (04/11/2023)   Exercise Vital Sign    Days of Exercise per Week: 4 days    Minutes of Exercise per Session: 60 min  Stress: No Stress Concern Present (04/11/2023)   [employer] of Occupational Health - Occupational Stress Questionnaire    Feeling of Stress : Not at all  Social Connections: Moderately Integrated (04/11/2023)   Social Connection and Isolation Panel    Frequency of Communication with Friends and Family: More than three times a week    Frequency of Social Gatherings with Friends and Family: More than three times a week    Attends Religious Services: 1 to 4 times per year     Active Member of [employer] or Organizations: No    Attends Banker Meetings: Never    Marital Status: Married  Catering manager Violence: Not At Risk (04/11/2023)   Humiliation, Afraid, Rape, and Kick questionnaire    Fear of Current or Ex-Partner: No    Emotionally Abused: No    Physically Abused: No    Sexually Abused: No    Family History: Family History  Problem Relation Age of Onset   Breast cancer Mother 52   Atrial fibrillation Father     Review of Systems: Denies any recent fevers, chills or changes in her health  Physical Exam: Vital Signs BP (!) 148/85   Pulse (!) 57   Ht 5' 5 (1.651 m)   Wt 189 lb (85.7 kg)   SpO2 95%   BMI 31.45 kg/m   Physical Exam  Constitutional:      General: Not in acute distress.    Appearance: Normal appearance. Not ill-appearing.  HENT:     Head: Normocephalic and atraumatic.     Eyelids: Ptosis of the upper eyelids is noted bilaterally Neck:     Musculoskeletal: Normal range of motion.  Cardiovascular:     Rate and Rhythm: Normal rate Pulmonary:     Effort: Pulmonary effort is normal. No respiratory distress.  Musculoskeletal: Normal range of motion.  Skin:    General: Skin is warm and dry.     Findings: No erythema or rash.  Neurological:     Mental Status: Alert and oriented to person, place, and time. Mental status is at baseline.  Psychiatric:        Mood and Affect: Mood normal.        Behavior: Behavior normal.    Assessment/Plan: The patient is scheduled for blepharoplasty with Dr. Lowery.  Risks, benefits, and alternatives of procedure discussed, questions answered and consent obtained.    Patient medically optimized pending clearances. Will send clearances to patient's cardiologist, electrophysiologist and PCP.  Clearance forms were given to clinical staff to fax off.  Smoking Status: Non-smoker; Counseling Given?  N/A  Caprini Score: 6; Risk Factors include: Age, BMI greater than 25, and length  of planned surgery. Recommendation for mechanical prophylaxis. Encourage early ambulation.   Pictures obtained: @consult   Post-op Rx sent to pharmacy: Tramadol, Keflex  Instructed patient to hold Celebrex , ibuprofen at  least 1 week prior to surgery.  Discussed with her to hold multivitamins and supplements 1 week prior to surgery.  Discussed with her to hold Entresto  and Lasix  the day of surgery.  Patient expressed understanding.  Patient was provided with the blepharoplasty and General Surgical Risk consent document and Pain Medication Agreement prior to their appointment.  They had adequate time to read through the risk consent documents and Pain Medication Agreement. We also discussed them in person together during this preop appointment. All of their questions were answered to their satisfaction.  Recommended calling if they have any further questions.  Risk consent form and Pain Medication Agreement to be scanned into patient's chart.  The consent was obtained with risks and complications reviewed which included bleeding, pain, scar, infection and the risk of anesthesia.  The patients questions were answered to the patients expressed satisfaction.    Electronically signed by: Estefana FORBES Peck, PA-C 01/01/2024 10:28 AM

## 2024-01-01 NOTE — Telephone Encounter (Signed)
   Name: Shelly Bowers  DOB: 1950/11/08  MRN: [id number]  Primary Cardiologist: Lynwood Schilling, MD   Preoperative team, please contact this patient and set up a phone call appointment ASAP as request is labeled STAT, for further preoperative risk assessment. Please obtain consent and complete medication review. Thank you for your help.  I confirm that guidance regarding antiplatelet and oral anticoagulation therapy has been completed and, if necessary, noted below.  Patient is not on anticoagulation or antiplatelet per review of medical record in Epic.    I also confirmed the patient resides in the state of [place] . As per [facility] Medical Board telemedicine laws, the patient must reside in the state in which the provider is licensed.   Lamarr Satterfield, NP 01/01/2024, 10:03 AM [facility]

## 2024-01-01 NOTE — Telephone Encounter (Signed)
 Faxed a Req for Surgical Clearance form to Dr. Lynwood Schilling and Dr. Ole Holts to the Pre-Op department for cardiology on [address] with confirmed receipt.  Fax: [phone]

## 2024-01-01 NOTE — Telephone Encounter (Signed)
 Request for Surgical Clearance  was faxed to PCP. Received  confirmation

## 2024-01-01 NOTE — Telephone Encounter (Signed)
 S/W pt and scheduled TELE Preop appt 01/03/24. Med Rec and Consent done.  Will update the surgeons office.

## 2024-01-01 NOTE — H&P (View-Only) (Signed)
 Patient ID: Shelly Bowers, female    DOB: [birth year], 73 y.o.   MRN: [id number]  Chief Complaint  Patient presents with   Pre-op Exam     PreOP: Bil bleph / Ins / Sx 01-23-24       ICD-10-CM   1. Dermatochalasis of both upper eyelids  H02.831    H02.834        History of Present Illness: Shelly Bowers is a 73 y.o.  female  with a history of dermatochalasis of the eyelids.  She presents for preoperative evaluation for upcoming procedure, blepharoplasty, scheduled for 01/23/2024 with Dr. Lowery.  The patient has not had problems with anesthesia.  Patient states that she does see a cardiologist and electrophysiologist as she did have a Watchman device placed.  She states that she is not taking any blood thinners since the watchman was placed.  She reports she is not a smoker.  Patient denies taking any birth control or hormone replacement.  She denies any history of miscarriages.  She denies any personal family history of blood clots or clotting diseases.  She denies any recent surgeries, traumas or infections.  She denies any history of stroke or heart attack.  She denies any history of Crohn's disease or ulcerative colitis, COPD or asthma.  She denies any history of cancer.  She denies any varicosities to her lower extremities.  She denies any recent fevers, chills or changes in her health.  Patient states that given her history of CHF and A-fib status post several ablations and Watchman procedure, Dr. Cindie her electrophysiologist told her that she cannot have epinephrine.  Summary of Previous Visit: Patient was seen for initial consult by Dr. Lowery on 09/24/2023.  At this visit, patient complained of trouble with her visual field.  Patient reported that she had a cardiac procedure and stated that she could not get epinephrine.  She did not have any other complaints.  Plan was to send the patient for a visual field exam.  Tentative plan was for bilateral upper lid  blepharoplasty.  It was noted that epinephrine would be avoided.  Job: Works part-time as a Conservation officer, [employer], planning to take 1 week off  PMH Significant for: Hypertension, atrial fibrillation status post Watchman device placement, acute systolic heart failure, dilated cardiomyopathy, OSA, dermatochalasis   Past Medical History: Allergies: Allergies  Allergen Reactions   Codeine Nausea Only   Hydrocodone  Nausea And Vomiting    Current Medications:  Current Outpatient Medications:    atorvastatin  (LIPITOR) 40 MG tablet, TAKE 1 TABLET BY MOUTH EVERY DAY, Disp: 90 tablet, Rfl: 1   CALCIUM -VITAMIN D  PO, Take 1 tablet by mouth daily., Disp: , Rfl:    celecoxib  (CELEBREX ) 200 MG capsule, Take 1 capsule (200 mg total) by mouth 2 (two) times daily as needed. TAKE 1 CAPSULE BY MOUTH 2 TIMES DAILY AS NEEDED, Disp: 60 capsule, Rfl: 1   cyanocobalamin  (VITAMIN B12) 1000 MCG tablet, Take 1,000 mcg by mouth daily., Disp: , Rfl:    folic acid (FOLVITE) 800 MCG tablet, Take 800 mcg by mouth daily., Disp: , Rfl:    furosemide  (LASIX ) 20 MG tablet, Take 1 tablet (20 mg total) by mouth daily as needed for fluid., Disp: 30 tablet, Rfl: 8   ibuprofen (ADVIL,MOTRIN) 200 MG tablet, Take 400 mg by mouth every 6 (six) hours as needed for headache, fever or mild pain., Disp: , Rfl:    levothyroxine  (SYNTHROID ) 50 MCG tablet, TAKE 1 TABLET BY MOUTH  DAILY BEFORE BREAKFAST, Disp: 90 tablet, Rfl: 1   metoprolol  succinate (TOPROL -XL) 25 MG 24 hr tablet, Take 0.5 tablets (12.5 mg total) by mouth daily., Disp: 45 tablet, Rfl: 3   metroNIDAZOLE (METROGEL) 0.75 % gel, Apply 1 application topically at bedtime., Disp: , Rfl:    naphazoline-pheniramine (VISINE) 0.025-0.3 % ophthalmic solution, Place 1 drop into both eyes as needed for eye irritation., Disp: , Rfl:    sacubitril -valsartan  (ENTRESTO ) 24-26 MG, TAKE 1 TABLET BY MOUTH TWICE A DAY, Disp: 180 tablet, Rfl: 3   vitamin E 180 MG (400 UNITS) capsule, Take 400 Units by mouth  daily., Disp: , Rfl:   Past Medical Problems: Past Medical History:  Diagnosis Date   Atrial fibrillation (HCC)    Cardiomyopathy (HCC)    HTN (hypertension)    Hyperlipidemia    Osteopenia    Presence of Watchman left atrial appendage closure device 07/12/2022   31mm Watchman FLX with Dr. Cindie    Past Surgical History: Past Surgical History:  Procedure Laterality Date   ATRIAL FIBRILLATION ABLATION N/A 11/21/2020   Procedure: ATRIAL FIBRILLATION ABLATION;  Surgeon: Cindie Ole DASEN, MD;  Location: [facility];  Service: Cardiovascular;  Laterality: N/A;   AUGMENTATION MAMMAPLASTY Bilateral    BREAST EXCISIONAL BIOPSY Left    BUBBLE STUDY  08/25/2020   Procedure: BUBBLE STUDY;  Surgeon: Santo Stanly LABOR, MD;  Location: [facility];  Service: Cardiovascular;;   CARDIOVERSION N/A 08/25/2020   Procedure: CARDIOVERSION;  Surgeon: Santo Stanly LABOR, MD;  Location: [facility];  Service: Cardiovascular;  Laterality: N/A;   CARDIOVERSION N/A 12/26/2020   Procedure: CARDIOVERSION;  Surgeon: Santo Stanly LABOR, MD;  Location: [facility];  Service: Cardiovascular;  Laterality: N/A;   INCISION AND DRAINAGE ABSCESS Left 12/27/2015   Procedure: INCISION AND DRAINAGE ABSCESS;  Surgeon: Arley Curia, MD;  Location: [facility];  Service: Orthopedics;  Laterality: Left;   LEFT ATRIAL APPENDAGE OCCLUSION N/A 07/12/2022   Procedure: LEFT ATRIAL APPENDAGE OCCLUSION;  Surgeon: Cindie Ole DASEN, MD;  Location: [facility];  Service: Cardiovascular;  Laterality: N/A;   TEE WITHOUT CARDIOVERSION N/A 08/25/2020   Procedure: TRANSESOPHAGEAL ECHOCARDIOGRAM (TEE);  Surgeon: Santo Stanly LABOR, MD;  Location: [facility] ENDOSCOPY;  Service: Cardiovascular;  Laterality: N/A;   TEE WITHOUT CARDIOVERSION N/A 07/12/2022   Procedure: TRANSESOPHAGEAL ECHOCARDIOGRAM;  Surgeon: Cindie Ole DASEN, MD;  Location: [facility] INVASIVE CV LAB;  Service: Cardiovascular;  Laterality: N/A;     Social History: Social History   Socioeconomic History   Marital status: Married    Spouse name: Not on file   Number of children: Not on file   Years of education: Not on file   Highest education level: Not on file  Occupational History   Not on file  Tobacco Use   Smoking status: Never   Smokeless tobacco: Never  Substance and Sexual Activity   Alcohol use: Yes    Alcohol/week: 14.0 standard drinks of alcohol    Types: 14 Glasses of wine per week    Comment: 1 glass of wine nightly   Drug use: No   Sexual activity: Not on file  Other Topics Concern   Not on file  Social History Narrative   Left handed   Caffeine use: tea sometimes   Soda-zero sugar/no caffeine ginger ale   1-2 cups coffee per day (decaf)   Social Drivers of Health   Financial Resource Strain: Low Risk  (04/11/2023)   Overall Financial Resource Strain (CARDIA)  Difficulty of Paying Living Expenses: Not hard at all  Food Insecurity: No Food Insecurity (04/11/2023)   Hunger Vital Sign    Worried About Running Out of Food in the Last Year: Never true    Ran Out of Food in the Last Year: Never true  Transportation Needs: No Transportation Needs (04/11/2023)   PRAPARE - Administrator, Civil Service (Medical): No    Lack of Transportation (Non-Medical): No  Physical Activity: Sufficiently Active (04/11/2023)   Exercise Vital Sign    Days of Exercise per Week: 4 days    Minutes of Exercise per Session: 60 min  Stress: No Stress Concern Present (04/11/2023)   [employer] of Occupational Health - Occupational Stress Questionnaire    Feeling of Stress : Not at all  Social Connections: Moderately Integrated (04/11/2023)   Social Connection and Isolation Panel    Frequency of Communication with Friends and Family: More than three times a week    Frequency of Social Gatherings with Friends and Family: More than three times a week    Attends Religious Services: 1 to 4 times per year     Active Member of [employer] or Organizations: No    Attends Banker Meetings: Never    Marital Status: Married  Catering manager Violence: Not At Risk (04/11/2023)   Humiliation, Afraid, Rape, and Kick questionnaire    Fear of Current or Ex-Partner: No    Emotionally Abused: No    Physically Abused: No    Sexually Abused: No    Family History: Family History  Problem Relation Age of Onset   Breast cancer Mother 52   Atrial fibrillation Father     Review of Systems: Denies any recent fevers, chills or changes in her health  Physical Exam: Vital Signs BP (!) 148/85   Pulse (!) 57   Ht 5' 5 (1.651 m)   Wt 189 lb (85.7 kg)   SpO2 95%   BMI 31.45 kg/m   Physical Exam  Constitutional:      General: Not in acute distress.    Appearance: Normal appearance. Not ill-appearing.  HENT:     Head: Normocephalic and atraumatic.     Eyelids: Ptosis of the upper eyelids is noted bilaterally Neck:     Musculoskeletal: Normal range of motion.  Cardiovascular:     Rate and Rhythm: Normal rate Pulmonary:     Effort: Pulmonary effort is normal. No respiratory distress.  Musculoskeletal: Normal range of motion.  Skin:    General: Skin is warm and dry.     Findings: No erythema or rash.  Neurological:     Mental Status: Alert and oriented to person, place, and time. Mental status is at baseline.  Psychiatric:        Mood and Affect: Mood normal.        Behavior: Behavior normal.    Assessment/Plan: The patient is scheduled for blepharoplasty with Dr. Lowery.  Risks, benefits, and alternatives of procedure discussed, questions answered and consent obtained.    Patient medically optimized pending clearances. Will send clearances to patient's cardiologist, electrophysiologist and PCP.  Clearance forms were given to clinical staff to fax off.  Smoking Status: Non-smoker; Counseling Given?  N/A  Caprini Score: 6; Risk Factors include: Age, BMI greater than 25, and length  of planned surgery. Recommendation for mechanical prophylaxis. Encourage early ambulation.   Pictures obtained: @consult   Post-op Rx sent to pharmacy: Tramadol, Keflex  Instructed patient to hold Celebrex , ibuprofen at  least 1 week prior to surgery.  Discussed with her to hold multivitamins and supplements 1 week prior to surgery.  Discussed with her to hold Entresto  and Lasix  the day of surgery.  Patient expressed understanding.  Patient was provided with the blepharoplasty and General Surgical Risk consent document and Pain Medication Agreement prior to their appointment.  They had adequate time to read through the risk consent documents and Pain Medication Agreement. We also discussed them in person together during this preop appointment. All of their questions were answered to their satisfaction.  Recommended calling if they have any further questions.  Risk consent form and Pain Medication Agreement to be scanned into patient's chart.  The consent was obtained with risks and complications reviewed which included bleeding, pain, scar, infection and the risk of anesthesia.  The patients questions were answered to the patients expressed satisfaction.    Electronically signed by: Estefana FORBES Peck, PA-C 01/01/2024 10:28 AM

## 2024-01-02 ENCOUNTER — Telehealth: Payer: Self-pay

## 2024-01-02 MED ORDER — SACUBITRIL-VALSARTAN 24-26 MG PO TABS: 1.0000 | ORAL_TABLET | Freq: Two times a day (BID) | ORAL | 2 refills | Status: AC

## 2024-01-02 NOTE — Telephone Encounter (Signed)
 Faxed Request for Surgical Clearance form to patient's PCP with confirmed receipt

## 2024-01-02 NOTE — Telephone Encounter (Signed)
 RX sent in

## 2024-01-02 NOTE — Progress Notes (Unsigned)
 Virtual Visit via Telephone Note   Because of Shelly Bowers co-morbid illnesses, she is at least at moderate risk for complications without adequate follow up.  This format is felt to be most appropriate for this patient at this time.  Due to technical limitations with video connection (technology), today's appointment will be conducted as an audio only telehealth visit, and Shelly Bowers verbally agreed to proceed in this manner.   All issues noted in this document were discussed and addressed.  No physical exam could be performed with this format.  Evaluation Performed:  Preoperative cardiovascular risk assessment _____________   Date:  01/02/2024   Patient ID:  Shelly Bowers, DOB 08-08-1950, MRN [id number] Patient Location:  Home Provider location:   Office  Primary Care Provider:  Theophilus Andrews, Tully GRADE, MD Primary Cardiologist:  Lynwood Schilling, MD  Chief Complaint / Patient Profile   73 y.o. y/o female with a h/o essential hypertension, atrial fibrillation, dilated cardiomyopathy who is pending blepharoplasty and presents today for telephonic preoperative cardiovascular risk assessment.  History of Present Illness    Shelly Bowers is a 73 y.o. female who presents via audio/video conferencing for a telehealth visit today.  Pt was last seen in cardiology clinic on 10/10/2023 by Dr. Schilling.  At that time Shelly Bowers was doing well .  The patient is now pending procedure as outlined above. Since her last visit, she remains stable from a cardiac standpoint.  Today she denies chest pain, shortness of breath, lower extremity edema, fatigue, palpitations, melena, hematuria, hemoptysis, diaphoresis, weakness, presyncope, syncope, orthopnea, and PND.   Past Medical History    Past Medical History:  Diagnosis Date   Atrial fibrillation (HCC)    Cardiomyopathy (HCC)    HTN (hypertension)    Hyperlipidemia    Osteopenia    Presence of Watchman left  atrial appendage closure device 07/12/2022   31mm Watchman FLX with Dr. Cindie   Past Surgical History:  Procedure Laterality Date   ATRIAL FIBRILLATION ABLATION N/A 11/21/2020   Procedure: ATRIAL FIBRILLATION ABLATION;  Surgeon: Cindie Ole DASEN, MD;  Location: [facility];  Service: Cardiovascular;  Laterality: N/A;   AUGMENTATION MAMMAPLASTY Bilateral    BREAST EXCISIONAL BIOPSY Left    BUBBLE STUDY  08/25/2020   Procedure: BUBBLE STUDY;  Surgeon: Santo Stanly LABOR, MD;  Location: [facility];  Service: Cardiovascular;;   CARDIOVERSION N/A 08/25/2020   Procedure: CARDIOVERSION;  Surgeon: Santo Stanly LABOR, MD;  Location: [facility];  Service: Cardiovascular;  Laterality: N/A;   CARDIOVERSION N/A 12/26/2020   Procedure: CARDIOVERSION;  Surgeon: Santo Stanly LABOR, MD;  Location: [facility];  Service: Cardiovascular;  Laterality: N/A;   INCISION AND DRAINAGE ABSCESS Left 12/27/2015   Procedure: INCISION AND DRAINAGE ABSCESS;  Surgeon: Arley Curia, MD;  Location: [facility];  Service: Orthopedics;  Laterality: Left;   LEFT ATRIAL APPENDAGE OCCLUSION N/A 07/12/2022   Procedure: LEFT ATRIAL APPENDAGE OCCLUSION;  Surgeon: Cindie Ole DASEN, MD;  Location: [facility];  Service: Cardiovascular;  Laterality: N/A;   TEE WITHOUT CARDIOVERSION N/A 08/25/2020   Procedure: TRANSESOPHAGEAL ECHOCARDIOGRAM (TEE);  Surgeon: Santo Stanly LABOR, MD;  Location[facility];  Service: Cardiovascular;  Laterality: N/A;   TEE WITHOUT CARDIOVERSION N/A 07/12/2022   Procedure: TRANSESOPHAGEAL ECHOCARDIOGRAM;  Surgeon: Cindie Ole DASEN, MD;  Location: [facility];  Service: Cardiovascular;  Laterality: N/A;    Allergies  Allergies  Allergen Reactions   Codeine Nausea Only  Hydrocodone  Nausea And Vomiting    Home Medications    Prior to Admission medications   Medication Sig Start Date End Date Taking? Authorizing Provider  atorvastatin   (LIPITOR) 40 MG tablet TAKE 1 TABLET BY MOUTH EVERY DAY 10/09/23   Theophilus Andrews, Tully GRADE, MD  CALCIUM -VITAMIN D  PO Take 1 tablet by mouth daily.    [provider]  celecoxib  (CELEBREX ) 200 MG capsule Take 1 capsule (200 mg total) by mouth 2 (two) times daily as needed. TAKE 1 CAPSULE BY MOUTH 2 TIMES DAILY AS NEEDED 10/24/23   Theophilus Andrews, Tully GRADE, MD  cephALEXin (KEFLEX) 500 MG capsule Take 1 capsule (500 mg total) by mouth 4 (four) times daily for 3 days. 01/01/24 01/04/24  Andris Estefana BRAVO, PA-C  cyanocobalamin  (VITAMIN B12) 1000 MCG tablet Take 1,000 mcg by mouth daily.    [provider]  folic acid (FOLVITE) 800 MCG tablet Take 800 mcg by mouth daily.    [provider]  furosemide  (LASIX ) 20 MG tablet Take 1 tablet (20 mg total) by mouth daily as needed for fluid. 11/29/22   McDaniel, Jill D, NP  ibuprofen (ADVIL,MOTRIN) 200 MG tablet Take 400 mg by mouth every 6 (six) hours as needed for headache, fever or mild pain.    [provider]  levothyroxine  (SYNTHROID ) 50 MCG tablet TAKE 1 TABLET BY MOUTH DAILY BEFORE BREAKFAST 12/18/23   Theophilus Andrews, Tully GRADE, MD  metoprolol  succinate (TOPROL -XL) 25 MG 24 hr tablet Take 0.5 tablets (12.5 mg total) by mouth daily. 10/10/23   Lavona Agent, MD  metroNIDAZOLE (METROGEL) 0.75 % gel Apply 1 application topically at bedtime. 11/10/20   [provider]  naphazoline-pheniramine (VISINE) 0.025-0.3 % ophthalmic solution Place 1 drop into both eyes as needed for eye irritation.    [provider]  sacubitril -valsartan  (ENTRESTO ) 24-26 MG Take 1 tablet by mouth 2 (two) times daily. 01/02/24   Lavona Agent, MD  traMADol (ULTRAM) 50 MG tablet Take 1 tablet (50 mg total) by mouth every 8 (eight) hours as needed for up to 10 doses for moderate pain (pain score 4-6) or severe pain (pain score 7-10). 01/01/24   Andris Estefana BRAVO, PA-C  vitamin E 180 MG (400 UNITS) capsule Take 400 Units by mouth  daily.    [provider]    Physical Exam    Vital Signs:  Shelly Bowers does not have vital signs available for review today.  Given telephonic nature of communication, physical exam is limited. AAOx3. NAD. Normal affect.  Speech and respirations are unlabored.  Accessory Clinical Findings    None  Assessment & Plan    1.  Preoperative Cardiovascular Risk Assessment:BLEPHAROPLASTY    Date of Surgery:  Clearance 01/23/24  STAT                                Surgeon:  DR. ESTEFANA FRITTER Surgeon's Group or Practice Name:  [facility]  Phone number:  [phone] Fax number:  [phone]      Primary Cardiologist: Agent Lavona, MD  Chart reviewed as part of pre-operative protocol coverage. Given past medical history and time since last visit, based on ACC/AHA guidelines, Shelly Bowers would be at acceptable risk for the planned procedure without further cardiovascular testing.   Patient was advised that if she  develops new symptoms prior to surgery to contact our office to arrange a follow-up appointment.  She verbalized understanding.    I will route this recommendation to the requesting party via Epic fax function and remove from pre-op pool.       Time:   Today, I have spent 5 minutes with the patient with telehealth technology discussing medical history, symptoms, and management plan.  I spent 10 minutes reviewing patient's past cardiac history and cardiac medications.    Josefa CHRISTELLA Beauvais, NP  01/02/2024, 4:19 PM

## 2024-01-03 ENCOUNTER — Ambulatory Visit

## 2024-01-03 DIAGNOSIS — Z01810 Encounter for preprocedural cardiovascular examination: Secondary | ICD-10-CM

## 2024-01-13 NOTE — Telephone Encounter (Signed)
 Duplicate request. At the bottom of the form hand written notes: perioperative recommendations for watchman device

## 2024-01-13 NOTE — Telephone Encounter (Signed)
 Pt does not have a pacemaker, defibrillator or loop recorder.  No action needed from device clinic.

## 2024-01-16 ENCOUNTER — Encounter (HOSPITAL_BASED_OUTPATIENT_CLINIC_OR_DEPARTMENT_OTHER): Payer: Self-pay | Admitting: Plastic Surgery

## 2024-01-16 ENCOUNTER — Other Ambulatory Visit: Payer: Self-pay

## 2024-01-16 ENCOUNTER — Telehealth: Payer: Self-pay | Admitting: Cardiology

## 2024-01-16 NOTE — Telephone Encounter (Signed)
 Notes have been re-faxed x 2 to [facility] fax# [phone]  Me    01/16/24  4:34 PM Note Request in 01/01/24 notes. Pt was seen by [name], FNP 01/03/24. Looks to be the pt has been cleared. Will d/w the preop APP to confirm      Shelly Damien BROCKS, NP to Cv Div Preop Callback      01/16/24  3:52 PM See attached message. Please provide details. Thank you.     01/16/24  1:00 PM [name] routed this conversation to Cv [facility] Preop   [name] [facility]   01/16/24  1:00 PM Note Pts surgical office requesting clearance be faxed. Please advise.    Surgeon:  DR. ESTEFANA FRITTER Surgeon's Group or Practice Name:  [facility]  Phone number:  [phone] Fax number:  [phone]           01/16/24 12:59 PM Shelly Bowers Bruna contacted [name]

## 2024-01-16 NOTE — Telephone Encounter (Signed)
 Pts surgical office requesting clearance be faxed. Please advise.   Surgeon:  DR. ESTEFANA FRITTER Surgeon's Group or Practice Name:  [facility]  Phone number:  [phone] Fax number:  [phone]

## 2024-01-16 NOTE — Telephone Encounter (Signed)
 Request in 01/01/24 notes. Pt was seen by [name], FNP 01/03/24. Looks to be the pt has been cleared. Will d/w the preop APP to confirm

## 2024-01-23 ENCOUNTER — Other Ambulatory Visit: Payer: Self-pay

## 2024-01-23 ENCOUNTER — Ambulatory Visit (HOSPITAL_BASED_OUTPATIENT_CLINIC_OR_DEPARTMENT_OTHER): Admitting: Anesthesiology

## 2024-01-23 ENCOUNTER — Encounter (HOSPITAL_BASED_OUTPATIENT_CLINIC_OR_DEPARTMENT_OTHER)
Admission: RE | Disposition: A | Discharge status: Home / Self Care | Payer: Self-pay | Source: Home / Self Care | Attending: Plastic Surgery

## 2024-01-23 ENCOUNTER — Ambulatory Visit (HOSPITAL_BASED_OUTPATIENT_CLINIC_OR_DEPARTMENT_OTHER)
Admission: RE | Admit: 2024-01-23 | Discharge: 2024-01-23 | Disposition: A | Discharge status: Home / Self Care | Attending: Plastic Surgery | Admitting: Plastic Surgery

## 2024-01-23 ENCOUNTER — Encounter (HOSPITAL_BASED_OUTPATIENT_CLINIC_OR_DEPARTMENT_OTHER): Payer: Self-pay | Admitting: Plastic Surgery

## 2024-01-23 DIAGNOSIS — I110 Hypertensive heart disease with heart failure: Secondary | ICD-10-CM | POA: Diagnosis not present

## 2024-01-23 DIAGNOSIS — H02834 Dermatochalasis of left upper eyelid: Secondary | ICD-10-CM | POA: Diagnosis not present

## 2024-01-23 DIAGNOSIS — H02831 Dermatochalasis of right upper eyelid: Secondary | ICD-10-CM

## 2024-01-23 DIAGNOSIS — I5022 Chronic systolic (congestive) heart failure: Secondary | ICD-10-CM | POA: Diagnosis not present

## 2024-01-23 DIAGNOSIS — G4733 Obstructive sleep apnea (adult) (pediatric): Secondary | ICD-10-CM | POA: Diagnosis not present

## 2024-01-23 DIAGNOSIS — I4891 Unspecified atrial fibrillation: Secondary | ICD-10-CM | POA: Diagnosis not present

## 2024-01-23 DIAGNOSIS — Z01818 Encounter for other preprocedural examination: Secondary | ICD-10-CM

## 2024-01-23 DIAGNOSIS — Z95818 Presence of other cardiac implants and grafts: Secondary | ICD-10-CM | POA: Insufficient documentation

## 2024-01-23 DIAGNOSIS — E039 Hypothyroidism, unspecified: Secondary | ICD-10-CM | POA: Insufficient documentation

## 2024-01-23 HISTORY — PX: BROW LIFT: SHX178

## 2024-01-23 SURGERY — BLEPHAROPLASTY
Anesthesia: General | Site: Eye | Laterality: Bilateral

## 2024-01-23 MED ORDER — LIDOCAINE 2% (20 MG/ML) 5 ML SYRINGE
INTRAMUSCULAR | Status: AC
  Filled 2024-01-23: qty 5

## 2024-01-23 MED ORDER — ONDANSETRON HCL 4 MG/2ML IJ SOLN
INTRAMUSCULAR | Status: DC | PRN
  Administered 2024-01-23: 4 mg via INTRAVENOUS

## 2024-01-23 MED ORDER — PHENYLEPHRINE 80 MCG/ML (10ML) SYRINGE FOR IV PUSH (FOR BLOOD PRESSURE SUPPORT)
PREFILLED_SYRINGE | INTRAVENOUS | Status: AC
Start: 2024-01-23 — End: 2024-01-23
  Filled 2024-01-23: qty 10

## 2024-01-23 MED ORDER — BSS IO SOLN
INTRAOCULAR | Status: DC | PRN
  Administered 2024-01-23: 15 mL

## 2024-01-23 MED ORDER — POVIDONE-IODINE 5 % OP SOLN
OPHTHALMIC | Status: DC | PRN
  Administered 2024-01-23: 1

## 2024-01-23 MED ORDER — ACETAMINOPHEN 10 MG/ML IV SOLN
INTRAVENOUS | Status: DC | PRN
  Administered 2024-01-23: 1000 mg via INTRAVENOUS

## 2024-01-23 MED ORDER — ACETAMINOPHEN 10 MG/ML IV SOLN
INTRAVENOUS | Status: AC
  Filled 2024-01-23: qty 100

## 2024-01-23 MED ORDER — EPHEDRINE 5 MG/ML INJ
INTRAVENOUS | Status: AC
  Filled 2024-01-23: qty 5

## 2024-01-23 MED ORDER — ACETAMINOPHEN 325 MG RE SUPP: 650.0000 mg | RECTAL | Status: DC | PRN

## 2024-01-23 MED ORDER — PROPOFOL 500 MG/50ML IV EMUL
INTRAVENOUS | Status: DC | PRN
  Administered 2024-01-23: 150 ug/kg/min via INTRAVENOUS

## 2024-01-23 MED ORDER — POVIDONE-IODINE 5 % OP SOLN
OPHTHALMIC | Status: AC
  Filled 2024-01-23: qty 30

## 2024-01-23 MED ORDER — SUCCINYLCHOLINE CHLORIDE 200 MG/10ML IV SOSY
PREFILLED_SYRINGE | INTRAVENOUS | Status: AC
  Filled 2024-01-23: qty 10

## 2024-01-23 MED ORDER — FENTANYL CITRATE (PF) 100 MCG/2ML IJ SOLN
INTRAMUSCULAR | Status: AC
  Filled 2024-01-23: qty 2

## 2024-01-23 MED ORDER — BSS IO SOLN
INTRAOCULAR | Status: AC
  Filled 2024-01-23: qty 15

## 2024-01-23 MED ORDER — EPHEDRINE SULFATE (PRESSORS) 25 MG/5ML IV SOSY
PREFILLED_SYRINGE | INTRAVENOUS | Status: DC | PRN
  Administered 2024-01-23: 15 mg via INTRAVENOUS
  Administered 2024-01-23: 5 mg via INTRAVENOUS

## 2024-01-23 MED ORDER — FENTANYL CITRATE (PF) 100 MCG/2ML IJ SOLN
INTRAMUSCULAR | Status: DC | PRN
  Administered 2024-01-23: 50 ug via INTRAVENOUS

## 2024-01-23 MED ORDER — BACITRACIN-POLYMYXIN B 500-10000 UNIT/GM OP OINT
TOPICAL_OINTMENT | OPHTHALMIC | Status: DC | PRN
Start: 2024-01-23 — End: 2024-01-23
  Administered 2024-01-23: 1

## 2024-01-23 MED ORDER — FENTANYL CITRATE (PF) 100 MCG/2ML IJ SOLN: 25.0000 ug | INTRAMUSCULAR | Status: DC | PRN

## 2024-01-23 MED ORDER — BACITRACIN-POLYMYXIN B 500-10000 UNIT/GM OP OINT
TOPICAL_OINTMENT | OPHTHALMIC | Status: AC
Start: 2024-01-23 — End: 2024-01-23
  Filled 2024-01-23: qty 3.5

## 2024-01-23 MED ORDER — CHLORHEXIDINE GLUCONATE CLOTH 2 % EX PADS: 6.0000 | MEDICATED_PAD | Freq: Once | CUTANEOUS | Status: DC

## 2024-01-23 MED ORDER — LIDOCAINE 2% (20 MG/ML) 5 ML SYRINGE
INTRAMUSCULAR | Status: DC | PRN
  Administered 2024-01-23: 20 mg via INTRAVENOUS

## 2024-01-23 MED ORDER — ACETAMINOPHEN 325 MG PO TABS: 650.0000 mg | ORAL_TABLET | ORAL | Status: DC | PRN

## 2024-01-23 MED ORDER — PROPOFOL 10 MG/ML IV BOLUS
INTRAVENOUS | Status: DC | PRN
  Administered 2024-01-23: 150 mg via INTRAVENOUS

## 2024-01-23 MED ORDER — CEFAZOLIN SODIUM-DEXTROSE 2-4 GM/100ML-% IV SOLN
INTRAVENOUS | Status: AC
  Filled 2024-01-23: qty 100

## 2024-01-23 MED ORDER — LIDOCAINE-EPINEPHRINE 1 %-1:100000 IJ SOLN
INTRAMUSCULAR | Status: DC | PRN
Start: 2024-01-23 — End: 2024-01-23
  Administered 2024-01-23: 3 mL

## 2024-01-23 MED ORDER — ATROPINE SULFATE 0.4 MG/ML IV SOLN
INTRAVENOUS | Status: AC
  Filled 2024-01-23: qty 1

## 2024-01-23 MED ORDER — LACTATED RINGERS IV SOLN: INTRAVENOUS | Status: DC

## 2024-01-23 MED ORDER — TOBRAMYCIN-DEXAMETHASONE 0.3-0.1 % OP OINT
TOPICAL_OINTMENT | OPHTHALMIC | Status: AC
  Filled 2024-01-23: qty 3.5

## 2024-01-23 MED ORDER — CEFAZOLIN SODIUM-DEXTROSE 2-4 GM/100ML-% IV SOLN
2.0000 g | INTRAVENOUS | Status: AC
  Administered 2024-01-23: 2 g via INTRAVENOUS

## 2024-01-23 MED ORDER — OXYCODONE HCL 5 MG PO TABS: 5.0000 mg | ORAL_TABLET | ORAL | Status: DC | PRN

## 2024-01-23 MED ORDER — SODIUM CHLORIDE 0.9% FLUSH: 3.0000 mL | INTRAVENOUS | Status: DC | PRN

## 2024-01-23 MED ORDER — BUPIVACAINE HCL (PF) 0.25 % IJ SOLN
INTRAMUSCULAR | Status: AC
  Filled 2024-01-23: qty 30

## 2024-01-23 MED ORDER — MIDAZOLAM HCL 2 MG/2ML IJ SOLN
INTRAMUSCULAR | Status: AC
  Filled 2024-01-23: qty 2

## 2024-01-23 MED ORDER — ONDANSETRON HCL 4 MG/2ML IJ SOLN
INTRAMUSCULAR | Status: AC
  Filled 2024-01-23: qty 2

## 2024-01-23 MED ORDER — LIDOCAINE-EPINEPHRINE 1 %-1:100000 IJ SOLN
INTRAMUSCULAR | Status: AC
Start: 2024-01-23 — End: 2024-01-23
  Filled 2024-01-23: qty 1

## 2024-01-23 MED ORDER — SODIUM CHLORIDE 0.9 % IV SOLN: 250.0000 mL | INTRAVENOUS | Status: DC | PRN

## 2024-01-23 MED ORDER — ACETAMINOPHEN 500 MG PO TABS: 1000.0000 mg | ORAL_TABLET | Freq: Once | ORAL | Status: DC

## 2024-01-23 MED ORDER — SODIUM CHLORIDE 0.9% FLUSH: 3.0000 mL | Freq: Two times a day (BID) | INTRAVENOUS | Status: DC

## 2024-01-23 MED ORDER — DEXAMETHASONE SODIUM PHOSPHATE 4 MG/ML IJ SOLN
INTRAMUSCULAR | Status: DC | PRN
  Administered 2024-01-23: 4 mg via INTRAVENOUS

## 2024-01-23 SURGICAL SUPPLY — 32 items
APPLICATOR COTTON TIP 6 STRL (MISCELLANEOUS) ×4 IMPLANT
APPLICATOR DR MATTHEWS STRL (MISCELLANEOUS) ×2 IMPLANT
BLADE SURG 15 STRL LF DISP TIS (BLADE) ×4 IMPLANT
BNDG EYE OVAL 2 1/8 X 2 5/8 (GAUZE/BANDAGES/DRESSINGS) ×4 IMPLANT
CORD BIPOLAR FORCEPS 12FT (ELECTRODE) ×2 IMPLANT
COVER BACK TABLE 60X90IN (DRAPES) ×2 IMPLANT
COVER MAYO STAND STRL (DRAPES) ×2 IMPLANT
DRAPE U-SHAPE 76X120 STRL (DRAPES) ×2 IMPLANT
ELECT NDL BLADE 2-5/6 (NEEDLE) ×2 IMPLANT
ELECT NEEDLE BLADE 2-5/6 (NEEDLE) ×1 IMPLANT
ELECTRODE REM PT RTRN 9FT ADLT (ELECTROSURGICAL) ×2 IMPLANT
GLOVE BIO SURGEON STRL SZ 6.5 (GLOVE) ×4 IMPLANT
GLOVE BIO SURGEON STRL SZ7.5 (GLOVE) IMPLANT
GLOVE BIOGEL PI IND STRL 7.0 (GLOVE) IMPLANT
GLOVE BIOGEL PI IND STRL 8 (GLOVE) IMPLANT
GOWN STRL REUS W/ TWL LRG LVL3 (GOWN DISPOSABLE) ×4 IMPLANT
GOWN STRL REUS W/TWL XL LVL3 (GOWN DISPOSABLE) IMPLANT
NDL HYPO 30GX1 BEV (NEEDLE) ×2 IMPLANT
NEEDLE HYPO 30GX1 BEV (NEEDLE) ×1 IMPLANT
PACK BASIN DAY SURGERY FS (CUSTOM PROCEDURE TRAY) ×2 IMPLANT
PENCIL SMOKE EVACUATOR (MISCELLANEOUS) ×2 IMPLANT
SLEEVE SCD COMPRESS KNEE MED (STOCKING) ×2 IMPLANT
SPIKE FLUID TRANSFER (MISCELLANEOUS) IMPLANT
STRIP CLOSURE SKIN 1/2X4 (GAUZE/BANDAGES/DRESSINGS) ×2 IMPLANT
STRIP SUTURE WOUND CLOSURE 1/2 (MISCELLANEOUS) IMPLANT
SUT CHROMIC 6 0 G 1 (SUTURE) IMPLANT
SUT CHROMIC 6 0 PS 4 (SUTURE) ×2 IMPLANT
SUT MNCRL 6-0 UNDY P1 1X18 (SUTURE) IMPLANT
SUT PLAIN 6 0 TG1408 (SUTURE) ×2 IMPLANT
SYR CONTROL 10ML LL (SYRINGE) ×2 IMPLANT
TOWEL GREEN STERILE FF (TOWEL DISPOSABLE) ×4 IMPLANT
TRAY DSU PREP LF (CUSTOM PROCEDURE TRAY) ×2 IMPLANT

## 2024-01-23 NOTE — Anesthesia Procedure Notes (Signed)
 Procedure Name: LMA Insertion Date/Time: 01/23/2024 7:39 AM  Performed by: Emilio Rock BIRCH, CRNAPre-anesthesia Checklist: Patient identified, Emergency Drugs available, Suction available and Patient being monitored Patient Re-evaluated:Patient Re-evaluated prior to induction Oxygen Delivery Method: Circle System Utilized Preoxygenation: Pre-oxygenation with 100% oxygen Induction Type: IV induction Ventilation: Mask ventilation without difficulty LMA: LMA flexible inserted LMA Size: 4.0 Number of attempts: 1 Airway Equipment and Method: bite block Placement Confirmation: positive ETCO2 Tube secured with: Tape Dental Injury: Teeth and Oropharynx as per pre-operative assessment

## 2024-01-23 NOTE — Transfer of Care (Signed)
 Immediate Anesthesia Transfer of Care Note  Patient: Shelly Bowers  Procedure(s) Performed: BLEPHAROPLASTY (Bilateral: Eye)  Patient Location: PACU  Anesthesia Type:General  Level of Consciousness: awake, alert , and oriented  Airway & Oxygen Therapy: Patient Spontanous Breathing and Patient connected to face mask oxygen  Post-op Assessment: Report given to RN and Post -op Vital signs reviewed and stable  Post vital signs: Reviewed and stable  Last Vitals:  Vitals Value Taken Time  BP    Temp    Pulse    Resp    SpO2      Last Pain:  Vitals:   01/23/24 0633  TempSrc: Temporal  PainSc: 2       Patients Stated Pain Goal: 2 (01/23/24 9366)  Complications: No notable events documented.

## 2024-01-23 NOTE — Anesthesia Preprocedure Evaluation (Signed)
 Anesthesia Evaluation  Patient identified by MRN, date of birth, ID band Patient awake    Reviewed: Allergy & Precautions, NPO status , Patient's Chart, lab work & pertinent test results  Airway Mallampati: II  TM Distance: >3 FB Neck ROM: Full    Dental  (+) Dental Advisory Given   Pulmonary sleep apnea    breath sounds clear to auscultation       Cardiovascular hypertension, Pt. on medications and Pt. on home beta blockers + dysrhythmias Atrial Fibrillation  Rhythm:Regular Rate:Normal     Neuro/Psych  Neuromuscular disease    GI/Hepatic negative GI ROS, Neg liver ROS,,,  Endo/Other  Hypothyroidism    Renal/GU negative Renal ROS     Musculoskeletal   Abdominal   Peds  Hematology negative hematology ROS (+)   Anesthesia Other Findings   Reproductive/Obstetrics                              Anesthesia Physical Anesthesia Plan  ASA: 2  Anesthesia Plan: General   Post-op Pain Management: Tylenol  PO (pre-op)*   Induction: Intravenous  PONV Risk Score and Plan: 3 and Dexamethasone , Ondansetron  and Treatment may vary due to age or medical condition  Airway Management Planned: LMA  Additional Equipment:   Intra-op Plan:   Post-operative Plan: Extubation in OR  Informed Consent: I have reviewed the patients History and Physical, chart, labs and discussed the procedure including the risks, benefits and alternatives for the proposed anesthesia with the patient or authorized representative who has indicated his/her understanding and acceptance.     Dental advisory given  Plan Discussed with: CRNA  Anesthesia Plan Comments:         Anesthesia Quick Evaluation

## 2024-01-23 NOTE — Op Note (Signed)
 Operative Note  Date of operation: 01/23/2024  Patient: Shelly Bowers, MRN: [id number], 73 y.o. female.  Date of birth:  [birth year]  Location: [facility]  Preoperative Diagnosis: Bilateral upper lid dermatochalasis  Postoperative Diagnosis: Same  Procedure: Bilateral Upper lid blepharoplasty  Surgeon: Estefana Fritter, DO  Assistant: Estefana Peck, PA  Condition: Stable  Complications: None   EBL: Minimal  Disposition: Recovery Room  Indications: To prevent complications and improve visual field function.  Improve quality of life.  The patient presented with concerns regarding heavy upper eyelids with a decrease visual field.  This made it difficult with activities and reading.  It was better with manual elevation of the upper eyelids or extending the neck.  The clinical exam and tests confirmed the need for bilateral upper eyelid blepharoplasty.  Surgical risks and benefits were discussed in detail and are in the chart.  Procedure in detail: Patient was seen on the morning of the procedure after anesthesia evaluation. The patient was marked. All questions were answered.  The patient was then taken to the operating room.  Anesthesia was administered and a time out was called with all information confirmed to be correct.  The patient was prepped and draped with an opthalmologic betadine prep. The markings were confirmed. Local anesthetic consisting of 1% lidocaine  with 1:100,000 units of epinephrine was injected into the upper lids. The local was given several minutes to take effect.  Right upper lid.  The redundant upper eyelid was measured with 20 mm of upper eyelid skin to remain.  A # 15 blade was used to incise the upper eyelid skin. Hemostasis was achieved with bipolar cautery.  The dissection was carried down past the orbicularis to expose the fat.  A small amount of fat was excised in the medial and middle compartment of the upper lid. The  incisions were closed with 6-0 Chromic as a running subcuticular.  Left upper lid.  The redundant upper eyelid was measured with 20 mm of upper eyelid skin to remain.  A # 15 blade was used to incise the upper eyelid skin.  Hemostasis was achieved with bipolar cautery.  The dissection was carried down past the orbicularis to expose the fat.  A small amount of fat was excised in the medial and middle compartment of the upper lid. The incisions were closed with 6-0 Chromic as a running subcuticular.  The incisions were washed.  Ice was applied and patient was allowed to wake up and was taken to the recovery room with no apparent complications. Family was notified at the end of the case.   The advanced practice practitioner (APP) assisted throughout the case.  The APP was essential in retraction and counter traction when needed to make the case progress smoothly.  This retraction and assistance made it possible to see the tissue plans for the procedure.  The assistance was needed for blood control, tissue re-approximation and assisted with closure of the incision site.

## 2024-01-23 NOTE — Anesthesia Postprocedure Evaluation (Signed)
 Anesthesia Post Note  Patient: Shelly Bowers  Procedure(s) Performed: BLEPHAROPLASTY (Bilateral: Eye)     Patient location during evaluation: PACU Anesthesia Type: General Level of consciousness: awake and alert Pain management: pain level controlled Vital Signs Assessment: post-procedure vital signs reviewed and stable Respiratory status: spontaneous breathing, nonlabored ventilation, respiratory function stable and patient connected to nasal cannula oxygen Cardiovascular status: blood pressure returned to baseline and stable Postop Assessment: no apparent nausea or vomiting Anesthetic complications: no   No notable events documented.  Last Vitals:  Vitals:   01/23/24 0900 01/23/24 0912  BP:  132/65  Pulse:  (!) 58  Resp:  16  Temp:  (!) 36.4 C  SpO2: 93% 93%    Last Pain:  Vitals:   01/23/24 0912  TempSrc:   PainSc: 0-No pain                 Epifanio Lamar BRAVO

## 2024-01-23 NOTE — Discharge Instructions (Addendum)
 INSTRUCTIONS FOR AFTER SURGERY   You will likely have some questions about what to expect following your operation.  The following information will help you and your family understand what to expect when you are discharged from the hospital.  It is important to follow these guidelines to help ensure a smooth recovery and reduce complication.  Postoperative instructions include information on: diet, wound care, medications and physical activity.  AFTER SURGERY Expect to go home after the procedure.  In some cases, you may need to spend one night in the hospital for observation.  DIET Surgery does not require a specific diet.  However, the healthier you eat the better your body will heal. It is important to increasing your protein intake.  This means limiting the foods with sugar and carbohydrates.  Focus on vegetables and some meat.  If you have liposuction during your procedure be sure to drink water.  If your urine is bright yellow, then it is concentrated, and you need to drink more water.  As a general rule after surgery, you should have 8 ounces of water every hour while awake.  If you find you are persistently nauseated or unable to take in liquids let us  know.  NO TOBACCO USE or EXPOSURE.  This will slow your healing process and lead to a wound.  WOUND CARE You can shower the day after surgery. If you have steri-strips / tape directly attached to your skin leave them in place. It is OK to get these wet.   No baths, pools or hot tubs for four weeks. We close your incision to leave the smallest and best-looking scar. No ointment or creams on your incisions for four weeks.  No Neosporin (Too many skin reactions. You can use Ice on the operative site.    ACTIVITY No heavy lifting until cleared by the doctor.  This usually means no more than a half-gallon of milk.  It is OK to walk and climb stairs. Moving your legs is very important to decrease your risk of a blood clot.  It will also help keep  you from getting deconditioned.  Every 1 to 2 hours get up and walk for 5 minutes. This will help with a quicker recovery back to normal.  Let pain be your guide so you don't do too much.  This time is for you to recover.  You will be more comfortable if you sleep and rest with your head elevated either with a few pillows under you or in a recliner.  No stomach sleeping for a month.  WORK Everyone returns to work at different times. As a rough guide, most people take at least 1 - 2 weeks off prior to returning to work. If you need documentation for your job, give the forms to the front staff at the clinic.  DRIVING Arrange for someone to bring you home from the hospital after your surgery.  You may be able to drive a few days after surgery but not while taking any narcotics or valium.  BOWEL MOVEMENTS Constipation can occur after anesthesia and while taking pain medication.  It is important to stay ahead for your comfort.  We recommend taking Milk of Magnesia (2 tablespoons; twice a day) while taking the pain pills.  MEDICATIONS You may be prescribed should start after surgery At your preoperative visit for you history and physical you were given the following medications: Antibiotic: Start this medication when you get home and take according to the instructions on the bottle.  Zofran  4 mg:  This is to treat nausea and vomiting.  You can take this every 6 hours as needed and only if needed. Oxycodone 5 mg every 6 hours for 3 - 5 days.  This is to be used after you have taken the Motrin or the Tylenol .  Over the counter Medication to take: Ibuprofen (Motrin) 400 - 600 mg every 6 hour for 7 days Tylenol  500 mg every 6 hours for 7 days.  Only take the Oxycodone after you have tried these two. MiraLAX or stool softener of choice: Take this according to the bottle if you take the Norco.  WHEN TO CALL Call your surgeon's office if any of the following occur: Fever 101 degrees F or greater Excessive  bleeding or fluid from the incision site. Pain that increases over time without aid from the medications Redness, warmth, or pus draining from incision sites Persistent nausea or inability to take in liquids Severe misshapen area that underwent the operation.   Post Anesthesia Home Care Instructions  Activity: Get plenty of rest for the remainder of the day. A responsible individual must stay with you for 24 hours following the procedure.  For the next 24 hours, DO NOT: -Drive a car -Advertising copywriter -Drink alcoholic beverages -Take any medication unless instructed by your physician -Make any legal decisions or sign important papers.  Meals: Start with liquid foods such as gelatin or soup. Progress to regular foods as tolerated. Avoid greasy, spicy, heavy foods. If nausea and/or vomiting occur, drink only clear liquids until the nausea and/or vomiting subsides. Call your physician if vomiting continues.  Special Instructions/Symptoms: Your throat may feel dry or sore from the anesthesia or the breathing tube placed in your throat during surgery. If this causes discomfort, gargle with warm salt water. The discomfort should disappear within 24 hours.  If you had a scopolamine patch placed behind your ear for the management of post- operative nausea and/or vomiting:  1. The medication in the patch is effective for 72 hours, after which it should be removed.  Wrap patch in a tissue and discard in the trash. Wash hands thoroughly with soap and water. 2. You may remove the patch earlier than 72 hours if you experience unpleasant side effects which may include dry mouth, dizziness or visual disturbances. 3. Avoid touching the patch. Wash your hands with soap and water after contact with the patch.     No Tylenol  until after 1:51pm today if needed

## 2024-01-23 NOTE — Interval H&P Note (Signed)
 History and Physical Interval Note:  01/23/2024 6:56 AM  Shelly Bowers  has presented today for surgery, with the diagnosis of dermatochalasis.  The various methods of treatment have been discussed with the patient and family. After consideration of risks, benefits and other options for treatment, the patient has consented to  Procedure(s): BLEPHAROPLASTY (Bilateral) as a surgical intervention.  The patient's history has been reviewed, patient examined, no change in status, stable for surgery.  I have reviewed the patient's chart and labs.  Questions were answered to the patient's satisfaction.     Estefana RAMAN Sharad Vaneaton

## 2024-01-24 ENCOUNTER — Encounter (HOSPITAL_BASED_OUTPATIENT_CLINIC_OR_DEPARTMENT_OTHER): Payer: Self-pay | Admitting: Plastic Surgery

## 2024-01-26 ENCOUNTER — Other Ambulatory Visit: Payer: Self-pay | Admitting: Internal Medicine

## 2024-01-27 ENCOUNTER — Encounter: Payer: Self-pay | Admitting: Radiology

## 2024-01-31 ENCOUNTER — Encounter: Payer: Self-pay | Admitting: Plastic Surgery

## 2024-01-31 ENCOUNTER — Ambulatory Visit (INDEPENDENT_AMBULATORY_CARE_PROVIDER_SITE_OTHER): Admitting: Plastic Surgery

## 2024-01-31 VITALS — BP 128/82 | HR 64

## 2024-01-31 DIAGNOSIS — H02834 Dermatochalasis of left upper eyelid: Secondary | ICD-10-CM

## 2024-01-31 DIAGNOSIS — H02831 Dermatochalasis of right upper eyelid: Secondary | ICD-10-CM

## 2024-01-31 NOTE — Progress Notes (Signed)
 The patient is a 73 year old female here for follow-up after undergoing bilateral upper lid blepharoplasties.  Overall she has a little bit of bruising and swelling but doing really well.  I was able to remove the lateral stitches.  I did remove medially as I do not see them.  Continue with protecting the area and keeping it clean.  Will plan to see her back in 2 weeks.  Pictures were obtained of the patient and placed in the chart with the patient's or guardian's permission.

## 2024-02-11 ENCOUNTER — Encounter: Admitting: Student

## 2024-02-13 ENCOUNTER — Ambulatory Visit: Admitting: Student

## 2024-02-13 ENCOUNTER — Encounter: Payer: Self-pay | Admitting: Student

## 2024-02-13 VITALS — BP 155/83 | HR 74

## 2024-02-13 DIAGNOSIS — H02831 Dermatochalasis of right upper eyelid: Secondary | ICD-10-CM

## 2024-02-13 DIAGNOSIS — H02834 Dermatochalasis of left upper eyelid: Secondary | ICD-10-CM

## 2024-02-13 NOTE — Progress Notes (Signed)
 Patient is a 73 year old female who recently underwent bilateral upper lid blepharoplasties with Dr. Lowery on 01/23/2024.  Patient is about 3 weeks postop.  She presents to the clinic today for postoperative follow-up.  Patient was last seen in the clinic on 01/31/2024.  At this visit, it was noted that patient had a little bit of bruising and swelling but was doing really well.  The lateral stitches were removed.  Plan is for patient to return in 2 weeks.  Today, patient reports she is doing really well.  She denies any fevers or chills.  She denies any new complaints or concerns.  She states overall she is happy with her results at this time.  On exam, patient is sitting upright in no acute distress.  There is a little bit of swelling noted to the bilateral periorbital regions.  There is no fluid collections palpated on exam.  No erythema or tenderness to palpation.  Incisions appear to be well-healed.  There are no sutures noted on exam.  There are 2 small bumps to the lateral eyelids.  I suspect that this is a little bit of scar tissue or it may be a suture underneath the skin.  There is no erythema or drainage.  No signs of infection.  Discussed with the patient that it appears she is healing very well from her surgery.  Recommended that she start applying silicone-based scar creams to the incisions.  I discussed with her she can massage those 2 small bumps out laterally.  Patient expressed understanding.  Patient has another appointment in a few weeks.  I discussed with her that if she continues to heal really well, she may cancel that appointment.  I discussed with her if she would like to be evaluated though she may keep that appointment.  She expressed understanding.  Pictures were obtained of the patient and placed in the chart with the patient's or guardian's permission.

## 2024-02-17 DIAGNOSIS — G4733 Obstructive sleep apnea (adult) (pediatric): Secondary | ICD-10-CM | POA: Diagnosis not present

## 2024-02-25 ENCOUNTER — Encounter: Admitting: Student

## 2024-02-26 ENCOUNTER — Ambulatory Visit (INDEPENDENT_AMBULATORY_CARE_PROVIDER_SITE_OTHER)

## 2024-02-26 DIAGNOSIS — Z23 Encounter for immunization: Secondary | ICD-10-CM | POA: Diagnosis not present

## 2024-02-27 ENCOUNTER — Encounter: Admitting: Student

## 2024-03-16 NOTE — Progress Notes (Addendum)
 Shelly Bowers                                          MRN: [id number]   03/16/2024   The [facility] Quality Team Specialist reviewed this patient medical record for the purposes of chart review for care gap closure. The following were reviewed: chart review for care gap closure-controlling blood pressure.  04/08/2024- no new cbp to close 2025    [facility] Quality Team

## 2024-03-24 ENCOUNTER — Ambulatory Visit (INDEPENDENT_AMBULATORY_CARE_PROVIDER_SITE_OTHER): Admitting: Family

## 2024-03-24 ENCOUNTER — Encounter: Payer: Self-pay | Admitting: Family

## 2024-03-24 DIAGNOSIS — M1711 Unilateral primary osteoarthritis, right knee: Secondary | ICD-10-CM

## 2024-03-24 MED ORDER — METHYLPREDNISOLONE ACETATE 40 MG/ML IJ SUSP
40.0000 mg | INTRAMUSCULAR | Status: AC | PRN
  Administered 2024-03-24: 40 mg via INTRA_ARTICULAR

## 2024-03-24 MED ORDER — LIDOCAINE HCL 1 % IJ SOLN
5.0000 mL | INTRAMUSCULAR | Status: AC | PRN
  Administered 2024-03-24: 5 mL

## 2024-03-24 NOTE — Progress Notes (Signed)
 "  Office Visit Note   Patient: Shelly Bowers           Date of Birth: [birth year]           MRN: [id number] Visit Date: 03/24/2024              Requested by: Theophilus Andrews, Tully GRADE, MD [address] [place],  [place] [address] PCP: Theophilus Andrews, Tully GRADE, MD  Chief Complaint  Patient presents with   Right Knee - Pain      HPI: The patient is a 73 year old woman who presents today requesting repeat Depo-Medrol  injection right knee.  She has chronic knee pain associated with her osteoarthritis  Difficulty performing her IADLs due to her knee pain.  Her last injection was in September  Assessment & Plan: Visit Diagnoses: No diagnosis found.  Plan: Depo-Medrol  injection right knee.  Patient tolerated well.  Follow-Up Instructions: No follow-ups on file.   Right Knee Exam   Tenderness  The patient is experiencing tenderness in the medial joint line.  Range of Motion  The patient has normal right knee ROM.  Tests  Varus: negative Valgus: negative  Other  Effusion: no effusion present      Patient is alert, oriented, no adenopathy, well-dressed, normal affect, normal respiratory effort.     Imaging: No results found. No images are attached to the encounter.  Labs: Lab Results  Component Value Date   HGBA1C 5.5 04/11/2022   HGBA1C 5.7 02/10/2021     Lab Results  Component Value Date   ALBUMIN 4.5 04/15/2023   ALBUMIN 4.5 04/11/2022   ALBUMIN 4.6 05/31/2021    Lab Results  Component Value Date   MG 2.0 09/01/2020   MG 1.6 (L) 08/26/2020   MG 2.0 08/23/2020   Lab Results  Component Value Date   VD25OH 45.44 04/15/2023   VD25OH 37.45 10/10/2022   VD25OH 35.43 07/17/2022    No results found for: PREALBUMIN    Latest Ref Rng & Units 04/15/2023    9:44 AM 06/25/2022   11:23 AM 04/11/2022   10:59 AM  CBC EXTENDED  WBC 4.0 - 10.5 K/uL 6.9  4.9  4.8   RBC 3.87 - 5.11 Mil/uL 3.57  3.53  3.64   Hemoglobin 12.0 - 15.0 g/dL 87.5   88.1  87.3   HCT 36.0 - 46.0 % 37.6  36.4  36.9   Platelets 150.0 - 400.0 K/uL 176.0  197  187.0   NEUT# 1.4 - 7.7 K/uL 4.9   2.6   Lymph# 0.7 - 4.0 K/uL 1.4   1.7      There is no height or weight on file to calculate BMI.  Orders:  No orders of the defined types were placed in this encounter.  No orders of the defined types were placed in this encounter.    Procedures: Large Joint Inj: R knee on 03/24/2024 9:30 AM Indications: pain Details: 18 G 1.5 in needle, anteromedial approach Medications: 5 mL lidocaine  1 %; 40 mg methylPREDNISolone  acetate 40 MG/ML Consent was given by the patient.      Clinical Data: No additional findings.  ROS:  All other systems negative, except as noted in the HPI. Review of Systems  Objective: Vital Signs: There were no vitals taken for this visit.  Specialty Comments:  EXAM: MRI LUMBAR SPINE WITHOUT CONTRAST   TECHNIQUE: Multiplanar, multisequence MR imaging of the lumbar spine was performed. No intravenous contrast was administered.   COMPARISON:  12/28/2008  FINDINGS: The vertebral bodies of the lumbar spine are normal in size. The vertebral bodies of the lumbar spine are normal in alignment. There is normal bone marrow signal demonstrated throughout the vertebra. There is degenerative disc disease with disc height loss at L1-2 and L4-5.   The spinal cord is normal in signal and contour. The cord terminates normally at L1 . The nerve roots of the cauda equina and the filum terminale are normal.   The visualized portions of the SI joints are unremarkable.   The imaged intra-abdominal contents are unremarkable.   T12-L1: No significant disc bulge. No evidence of neural foraminal stenosis. No central canal stenosis.   L1-L2: Mild broad-based disc bulge. Mild right facet arthropathy. Moderate left facet arthropathy and left lateral recess stenosis. Moderate left foraminal stenosis.   L2-L3: Mild broad-based disc  bulge. Mild bilateral facet arthropathy. Left lateral recess stenosis. No evidence of neural foraminal stenosis. No central canal stenosis.   L3-L4: Mild broad-based disc bulge. Moderate bilateral facet arthropathy with lateral recess stenosis and mild central canal stenosis. No evidence of neural foraminal stenosis.   L4-L5: Mild broad-based disc bulge. Severe right and mild left facet arthropathy. Mild right foraminal stenosis. No left foraminal stenosis.   L5-S1: Mild broad-based disc bulge. No evidence of neural foraminal stenosis. No central canal stenosis.   IMPRESSION: 1. At L1-2 there is mild broad-based disc bulge. Mild right facet arthropathy. Moderate left facet arthropathy and left lateral recess stenosis. Moderate left foraminal stenosis. 2. At L3-4 there is mild broad-based disc bulge. Moderate bilateral facet arthropathy with lateral recess stenosis and mild central canal stenosis. 3. At L4-5 with a mild broad-based disc bulge. Severe right and mild left facet arthropathy. Mild right foraminal stenosis.     Electronically Signed   By: Julaine Blanch   On: 04/28/2015 10:01  PMFS History: Patient Active Problem List   Diagnosis Date Noted   Dermatochalasis of both eyelids 09/24/2023   Atrial fibrillation (HCC) 07/12/2022   Presence of Watchman left atrial appendage closure device 07/12/2022   Dilated cardiomyopathy (HCC) 12/13/2021   Snoring 12/13/2021   Nocturia 12/13/2021   Severe obstructive sleep apnea-hypopnea syndrome 12/13/2021   Hypothyroidism 08/17/2021   Secondary hypercoagulable state 12/19/2020   Acute systolic HF (heart failure) (HCC) 09/18/2020   Hypomagnesemia 09/18/2020   Hypokalemia 09/18/2020   Persistent atrial fibrillation (HCC) 08/24/2020   Atrial fibrillation with RVR (HCC) 08/23/2020   Macrocytosis without anemia 01/26/2020   Vitamin D  deficiency 01/26/2020   SCC (squamous cell carcinoma) 04/25/2018   Hyperlipidemia 02/28/2018    Osteopenia 02/28/2018   Essential hypertension 10/02/2017   Chronic sciatica 08/21/2017   Menopausal symptom 08/21/2017   Granuloma of skin 01/04/2016   Dyslipidemia 04/20/2013   Encounter for preventive health examination 03/15/2011   HIP PAIN, LEFT 10/28/2008   Past Medical History:  Diagnosis Date   Atrial fibrillation (HCC)    Cardiomyopathy (HCC)    HTN (hypertension)    Hyperlipidemia    Osteopenia    Presence of Watchman left atrial appendage closure device 07/12/2022   31mm Watchman FLX with Dr. Cindie    Family History  Problem Relation Age of Onset   Breast cancer Mother 1   Atrial fibrillation Father     Past Surgical History:  Procedure Laterality Date   ATRIAL FIBRILLATION ABLATION N/A 11/21/2020   Procedure: ATRIAL FIBRILLATION ABLATION;  Surgeon: Cindie Ole DASEN, MD;  [facility]: [facility];  Service: Cardiovascular;  Laterality: N/A;  AUGMENTATION MAMMAPLASTY Bilateral    BREAST EXCISIONAL BIOPSY Left    BROW LIFT Bilateral 01/23/2024   Procedure: BLEPHAROPLASTY;  Surgeon: Lowery Estefana RAMAN, DO;  Location: [facility];  Service: Plastics;  Laterality: Bilateral;   BUBBLE STUDY  08/25/2020   Procedure: BUBBLE STUDY;  Surgeon: Santo Stanly LABOR, MD;  Location: [facility];  Service: Cardiovascular;;   CARDIOVERSION N/A 08/25/2020   Procedure: CARDIOVERSION;  Surgeon: Santo Stanly LABOR, MD;  Location: [facility];  Service: Cardiovascular;  Laterality: N/A;   CARDIOVERSION N/A 12/26/2020   Procedure: CARDIOVERSION;  Surgeon: Santo Stanly LABOR, MD;  Location: [facility];  Service: Cardiovascular;  Laterality: N/A;   INCISION AND DRAINAGE ABSCESS Left 12/27/2015   Procedure: INCISION AND DRAINAGE ABSCESS;  Surgeon: Arley Curia, MD;  Location: [facility];  Service: Orthopedics;  Laterality: Left;   LEFT ATRIAL APPENDAGE OCCLUSION N/A 07/12/2022   Procedure: LEFT ATRIAL APPENDAGE OCCLUSION;  Surgeon: Cindie Ole DASEN, MD;  [facility]: [facility];  Service: Cardiovascular;  Laterality: N/A;   TEE WITHOUT CARDIOVERSION N/A 08/25/2020   Procedure: TRANSESOPHAGEAL ECHOCARDIOGRAM (TEE);  Surgeon: Santo Stanly LABOR, MD;  Location: [facility];  Service: Cardiovascular;  Laterality: N/A;   TEE WITHOUT CARDIOVERSION N/A 07/12/2022   Procedure: TRANSESOPHAGEAL ECHOCARDIOGRAM;  Surgeon: Cindie Ole DASEN, MD;  Location: [facility];  Service: Cardiovascular;  Laterality: N/A;   Social History   Occupational History   Not on file  Tobacco Use   Smoking status: Never   Smokeless tobacco: Never  Substance and Sexual Activity   Alcohol use: Yes    Alcohol/week: 19.0 standard drinks of alcohol    Types: 19 Glasses of wine per week    Comment: 2-3 per night   Drug use: No   Sexual activity: Not on file       "

## 2024-03-25 ENCOUNTER — Other Ambulatory Visit: Payer: Self-pay | Admitting: Internal Medicine

## 2024-03-25 DIAGNOSIS — E782 Mixed hyperlipidemia: Secondary | ICD-10-CM

## 2024-04-15 ENCOUNTER — Ambulatory Visit (INDEPENDENT_AMBULATORY_CARE_PROVIDER_SITE_OTHER): Payer: Medicare HMO | Admitting: Internal Medicine

## 2024-04-15 ENCOUNTER — Encounter: Payer: Self-pay | Admitting: Internal Medicine

## 2024-04-15 VITALS — BP 110/80 | HR 50 | Temp 98.0°F | Ht 65.0 in | Wt 182.6 lb

## 2024-04-15 DIAGNOSIS — Z0000 Encounter for general adult medical examination without abnormal findings: Secondary | ICD-10-CM | POA: Diagnosis not present

## 2024-04-15 DIAGNOSIS — I5021 Acute systolic (congestive) heart failure: Secondary | ICD-10-CM

## 2024-04-15 DIAGNOSIS — E785 Hyperlipidemia, unspecified: Secondary | ICD-10-CM

## 2024-04-15 DIAGNOSIS — Z1211 Encounter for screening for malignant neoplasm of colon: Secondary | ICD-10-CM

## 2024-04-15 DIAGNOSIS — I10 Essential (primary) hypertension: Secondary | ICD-10-CM | POA: Diagnosis not present

## 2024-04-15 DIAGNOSIS — E039 Hypothyroidism, unspecified: Secondary | ICD-10-CM | POA: Diagnosis not present

## 2024-04-15 DIAGNOSIS — E876 Hypokalemia: Secondary | ICD-10-CM

## 2024-04-15 DIAGNOSIS — E559 Vitamin D deficiency, unspecified: Secondary | ICD-10-CM | POA: Diagnosis not present

## 2024-04-15 DIAGNOSIS — D7589 Other specified diseases of blood and blood-forming organs: Secondary | ICD-10-CM

## 2024-04-15 DIAGNOSIS — I4891 Unspecified atrial fibrillation: Secondary | ICD-10-CM

## 2024-04-15 DIAGNOSIS — M85852 Other specified disorders of bone density and structure, left thigh: Secondary | ICD-10-CM

## 2024-04-15 LAB — CBC WITH DIFFERENTIAL/PLATELET
Basophils Absolute: 0 K/uL (ref 0.0–0.1)
Basophils Relative: 0.8 % (ref 0.0–3.0)
Eosinophils Absolute: 0.1 K/uL (ref 0.0–0.7)
Eosinophils Relative: 2.1 % (ref 0.0–5.0)
HCT: 38.4 % (ref 36.0–46.0)
Hemoglobin: 12.9 g/dL (ref 12.0–15.0)
Lymphocytes Relative: 30.8 % (ref 12.0–46.0)
Lymphs Abs: 1.5 K/uL (ref 0.7–4.0)
MCHC: 33.5 g/dL (ref 30.0–36.0)
MCV: 102.2 fl — ABNORMAL HIGH (ref 78.0–100.0)
Monocytes Absolute: 0.4 K/uL (ref 0.1–1.0)
Monocytes Relative: 8.6 % (ref 3.0–12.0)
Neutro Abs: 2.8 K/uL (ref 1.4–7.7)
Neutrophils Relative %: 57.7 % (ref 43.0–77.0)
Platelets: 158 K/uL (ref 150.0–400.0)
RBC: 3.76 Mil/uL — ABNORMAL LOW (ref 3.87–5.11)
RDW: 13.5 % (ref 11.5–15.5)
WBC: 4.8 K/uL (ref 4.0–10.5)

## 2024-04-15 LAB — COMPREHENSIVE METABOLIC PANEL WITH GFR
ALT: 20 U/L (ref 3–35)
AST: 23 U/L (ref 5–37)
Albumin: 4.5 g/dL (ref 3.5–5.2)
Alkaline Phosphatase: 68 U/L (ref 39–117)
BUN: 13 mg/dL (ref 6–23)
CO2: 28 meq/L (ref 19–32)
Calcium: 9.4 mg/dL (ref 8.4–10.5)
Chloride: 101 meq/L (ref 96–112)
Creatinine, Ser: 0.84 mg/dL (ref 0.40–1.20)
GFR: 67.69 mL/min
Glucose, Bld: 85 mg/dL (ref 70–99)
Potassium: 4.5 meq/L (ref 3.5–5.1)
Sodium: 136 meq/L (ref 135–145)
Total Bilirubin: 1 mg/dL (ref 0.2–1.2)
Total Protein: 6.8 g/dL (ref 6.0–8.3)

## 2024-04-15 LAB — LIPID PANEL
Cholesterol: 180 mg/dL (ref 28–200)
HDL: 71.6 mg/dL
LDL Cholesterol: 94 mg/dL (ref 10–99)
NonHDL: 108.32
Total CHOL/HDL Ratio: 3
Triglycerides: 71 mg/dL (ref 10.0–149.0)
VLDL: 14.2 mg/dL (ref 0.0–40.0)

## 2024-04-15 LAB — TSH: TSH: 0.8 u[IU]/mL (ref 0.35–5.50)

## 2024-04-15 LAB — VITAMIN D 25 HYDROXY (VIT D DEFICIENCY, FRACTURES): VITD: 39.62 ng/mL (ref 30.00–100.00)

## 2024-04-15 NOTE — Progress Notes (Signed)
 "    Established Patient Office Visit     CC/Reason for Visit: Annual preventive exam and subsequent Medicare wellness visit  HPI: Shelly Bowers is a 74 y.o. female who is coming in today for the above mentioned reasons. Past Medical History is significant for: History of atrial fibrillation status post Watchman procedure, hypertension, hyperlipidemia, hypothyroidism and vitamin D  deficiency.  She is feeling well and has no acute concerns or complaints.  Has routine eye and dental care.  Is due for colon cancer screening.  All other immunizations, cancer screenings and DEXA scan's are up-to-date.   Past Medical/Surgical History: Past Medical History:  Diagnosis Date   Atrial fibrillation (HCC)    Cardiomyopathy (HCC)    HTN (hypertension)    Hyperlipidemia    Osteopenia    Presence of Watchman left atrial appendage closure device 07/12/2022   31mm Watchman FLX with Dr. Cindie    Past Surgical History:  Procedure Laterality Date   ATRIAL FIBRILLATION ABLATION N/A 11/21/2020   Procedure: ATRIAL FIBRILLATION ABLATION;  Surgeon: Cindie Ole DASEN, MD;  [facility];  Service: Cardiovascular;  Laterality: N/A;   AUGMENTATION MAMMAPLASTY Bilateral    BREAST EXCISIONAL BIOPSY Left    BROW LIFT Bilateral 01/23/2024   Procedure: BLEPHAROPLASTY;  Surgeon: Lowery Estefana RAMAN, DO;  Location: [facility];  Service: Plastics;  Laterality: Bilateral;   BUBBLE STUDY  08/25/2020   Procedure: BUBBLE STUDY;  Surgeon: Santo Stanly LABOR, MD;  Location: [facility];  Service: Cardiovascular;;   CARDIOVERSION N/A 08/25/2020   Procedure: CARDIOVERSION;  Surgeon: Santo Stanly LABOR, MD;  Location: [facility];  Service: Cardiovascular;  Laterality: N/A;   CARDIOVERSION N/A 12/26/2020   Procedure: CARDIOVERSION;  Surgeon: Santo Stanly LABOR, MD;  Location: [facility];  Service: Cardiovascular;  Laterality: N/A;   INCISION AND DRAINAGE ABSCESS Left  12/27/2015   Procedure: INCISION AND DRAINAGE ABSCESS;  Surgeon: Arley Curia, MD;  Location: [facility];  Service: Orthopedics;  Laterality: Left;   LEFT ATRIAL APPENDAGE OCCLUSION N/A 07/12/2022   Procedure: LEFT ATRIAL APPENDAGE OCCLUSION;  Surgeon: Cindie Ole DASEN, MD;  [facility]: [facility];  Service: Cardiovascular;  Laterality: N/A;   TEE WITHOUT CARDIOVERSION N/A 08/25/2020   Procedure: TRANSESOPHAGEAL ECHOCARDIOGRAM (TEE);  Surgeon: Santo Stanly LABOR, MD;  Location: [facility];  Service: Cardiovascular;  Laterality: N/A;   TEE WITHOUT CARDIOVERSION N/A 07/12/2022   Procedure: TRANSESOPHAGEAL ECHOCARDIOGRAM;  Surgeon: Cindie Ole DASEN, MD;  Location: [facility];  Service: Cardiovascular;  Laterality: N/A;    Social History:  reports that she has never smoked. She has never used smokeless tobacco. She reports current alcohol use of about 19.0 standard drinks of alcohol per week. She reports that she does not use drugs.  Allergies: Allergies[1]  Family History:  Family History  Problem Relation Age of Onset   Breast cancer Mother 56   Atrial fibrillation Father     Current Medications[2]  Review of Systems:  Negative unless indicated in HPI.   Physical Exam: Vitals:   04/15/24 1011  BP: 110/80  Pulse: (!) 50  Temp: 98 F (36.7 C)  TempSrc: Oral  SpO2: 98%  Weight: 182 lb 9.6 oz (82.8 kg)  Height: 5' 5 (1.651 m)    Body mass index is 30.39 kg/m.   Physical Exam Vitals reviewed.  Constitutional:      General: She is not in acute distress.    Appearance: Normal appearance. She is not ill-appearing, toxic-appearing  or diaphoretic.  HENT:     Head: Normocephalic.     Right Ear: Tympanic membrane, ear canal and external ear normal. There is no impacted cerumen.     Left Ear: Tympanic membrane, ear canal and external ear normal. There is no impacted cerumen.     Nose: Nose normal.     Mouth/Throat:     Mouth: Mucous  membranes are moist.     Pharynx: Oropharynx is clear. No oropharyngeal exudate or posterior oropharyngeal erythema.  Eyes:     General: No scleral icterus.       Right eye: No discharge.        Left eye: No discharge.     Conjunctiva/sclera: Conjunctivae normal.     Pupils: Pupils are equal, round, and reactive to light.  Neck:     Vascular: No carotid bruit.  Cardiovascular:     Rate and Rhythm: Normal rate and regular rhythm.     Pulses: Normal pulses.     Heart sounds: Normal heart sounds.  Pulmonary:     Effort: Pulmonary effort is normal. No respiratory distress.     Breath sounds: Normal breath sounds.  Abdominal:     General: Abdomen is flat. Bowel sounds are normal.     Palpations: Abdomen is soft.  Musculoskeletal:        General: Normal range of motion.     Cervical back: Normal range of motion.  Skin:    General: Skin is warm and dry.  Neurological:     General: No focal deficit present.     Mental Status: She is alert and oriented to person, place, and time. Mental status is at baseline.  Psychiatric:        Mood and Affect: Mood normal.        Behavior: Behavior normal.        Thought Content: Thought content normal.        Judgment: Judgment normal.    Subsequent Medicare wellness visit   Visit info / Clinical Intake: Medicare Wellness Visit Type:: Subsequent Annual Wellness Visit Persons participating in visit and providing information:: patient Medicare Wellness Visit Mode:: In-person (required for WTM) Interpreter Needed?: No Pre-visit prep was completed: yes AWV questionnaire completed by patient prior to visit?: yes Date:: 03/14/25 Living arrangements:: lives with spouse/significant other Patient's Overall Health Status Rating: good Typical amount of pain: none Does pain affect daily life?: no Are you currently prescribed opioids?: no  Dietary Habits and Nutritional Risks How many meals a day?: 2 Eats fruit and vegetables daily?: yes Most meals  are obtained by: preparing own meals In the last 2 weeks, have you had any of the following?: none Diabetic:: no  Functional Status Activities of Daily Living (to include ambulation/medication): (Patient-Rptd) Independent Ambulation: (Patient-Rptd) Independent Medication Administration: (Patient-Rptd) Independent Home Management (perform basic housework or laundry): (Patient-Rptd) Independent Manage your own finances?: (Patient-Rptd) yes Primary transportation is: (Patient-Rptd) driving Concerns about vision?: no *vision screening is required for WTM* Concerns about hearing?: no  Fall Screening Falls in the past year?: (Patient-Rptd) 1 Number of falls in past year: (Patient-Rptd) 0 Was there an injury with Fall?: (Patient-Rptd) 1 Fall Risk Category Calculator: (Patient-Rptd) 2 Patient Fall Risk Level: (Patient-Rptd) Moderate Fall Risk  Fall Risk Patient at Risk for Falls Due to: History of fall(s) Fall risk Follow up: Falls evaluation completed  Home and Transportation Safety: All rugs have non-skid backing?: (Patient-Rptd) yes All stairs or steps have railings?: (Patient-Rptd) yes Grab bars in the bathtub  or shower?: (Patient-Rptd) yes Have non-skid surface in bathtub or shower?: (Patient-Rptd) yes Good home lighting?: (Patient-Rptd) yes Regular seat belt use?: (Patient-Rptd) yes Hospital stays in the last year:: (Patient-Rptd) no  Cognitive Assessment Difficulty concentrating, remembering, or making decisions? : (Patient-Rptd) no Will 6CIT or Mini Cog be Completed: yes What year is it?: 0 points What month is it?: 0 points Give patient an address phrase to remember (5 components): jack and jill went the [place] About what time is it?: 0 points Count backwards from 20 to 1: 0 points Say the months of the year in reverse: 0 points Repeat the address phrase from earlier: 0 points 6 CIT Score: 0 points  Advance Directives (For Healthcare) Does Patient Have a Medical  Advance Directive?: Yes Does patient want to make changes to medical advance directive?: No - Patient declined Type of Advance Directive: Healthcare Power of [place]; Living will Copy of Healthcare Power of Attorney in Chart?: No - copy requested  Reviewed/Updated  Reviewed/Updated: Reviewed All (Medical, Surgical, Family, Medications, Allergies, Care Teams, Patient Goals)    Vision Screening   Right eye Left eye Both eyes  Without correction     With correction 20/25 20/25 20/25       Depression/mood:  Flowsheet Row Office Visit from 10/10/2022 in [facility] [place]  PHQ-9 Total Score 0        Counseling: Counseling given: Not Answered     Lab orders based on risk factors: Laboratory update will be reviewed     Screening: Patient provided with a written and personalized 5-10 year screening schedule in the AVS. Health Maintenance  Topic Date Due   Pap Smear  08/16/2021   COVID-19 Vaccine (6 - 2025-26 season) 11/25/2023   Medicare Annual Wellness Visit  04/15/2024*   Breast Cancer Screening  09/16/2024   DTaP/Tdap/Td vaccine (2 - Td or Tdap) 04/20/2031   Pneumococcal Vaccine for age over 27  Completed   Flu Shot  Completed   Osteoporosis screening with Bone Density Scan  Completed   Hepatitis C Screening  Completed   Zoster (Shingles) Vaccine  Completed   Meningitis B Vaccine  Aged Out   Cologuard (Stool DNA test)  Discontinued  *Topic was postponed. The date shown is not the original due date.     Provider List Update: Patient Care Team    Relationship Specialty Notifications Start End  Theophilus Andrews, Tully GRADE, MD PCP - General Internal Medicine  02/28/18   Lavona Agent, MD PCP - Cardiology Cardiology  08/24/20   Cindie Ole DASEN, MD (Inactive) PCP - Electrophysiology Cardiology  11/08/20   Lionell Jon DEL, [facility]  Pharmacist  08/14/22        I have personally reviewed and noted the following in the patients chart:   Medical and  social history Use of alcohol, tobacco or illicit drugs  Current medications and supplements Functional ability and status Nutritional status Physical activity Advanced directives List of other physicians Hospitalizations, surgeries, and ER visits in previous 12 months Vitals Screenings to include cognitive, depression, and falls Referrals and appointments  In addition, I have reviewed and discussed with patient certain preventive protocols, quality metrics, and best practice recommendations. A written personalized care plan for preventive services as well as general preventive health recommendations were provided to patient.   Impression and Plan:  Dyslipidemia -     Lipid panel; Future  Essential hypertension -     CBC with Differential/Platelet; Future -  Comprehensive metabolic panel with GFR; Future  Vitamin D  deficiency -     VITAMIN D  25 Hydroxy (Vit-D Deficiency, Fractures); Future  Acquired hypothyroidism -     TSH; Future  Screening for colon cancer -     Cologuard   -Recommend routine eye and dental care. -Healthy lifestyle discussed in detail. -Labs to be updated today. -Prostate cancer screening: Not applicable Health Maintenance  Topic Date Due   Pap Smear  08/16/2021   COVID-19 Vaccine (6 - 2025-26 season) 11/25/2023   Medicare Annual Wellness Visit  04/15/2024*   Breast Cancer Screening  09/16/2024   DTaP/Tdap/Td vaccine (2 - Td or Tdap) 04/20/2031   Pneumococcal Vaccine for age over 67  Completed   Flu Shot  Completed   Osteoporosis screening with Bone Density Scan  Completed   Hepatitis C Screening  Completed   Zoster (Shingles) Vaccine  Completed   Meningitis B Vaccine  Aged Out   Cologuard (Stool DNA test)  Discontinued  *Topic was postponed. The date shown is not the original due date.     -Cologuard ordered.    Tully Theophilus Andrews, MD [facility]     [1]  Allergies Allergen Reactions   Codeine  Nausea Only   Hydrocodone  Nausea And Vomiting  [2]  Current Outpatient Medications:    atorvastatin  (LIPITOR) 40 MG tablet, TAKE 1 TABLET BY MOUTH EVERY DAY, Disp: 90 tablet, Rfl: 1   CALCIUM -VITAMIN D  PO, Take 1 tablet by mouth daily., Disp: , Rfl:    celecoxib  (CELEBREX ) 200 MG capsule, TAKE 1 CAPSULE BY MOUTH TWICE A DAY AS NEEDED, Disp: 60 capsule, Rfl: 1   cyanocobalamin  (VITAMIN B12) 1000 MCG tablet, Take 1,000 mcg by mouth daily., Disp: , Rfl:    folic acid (FOLVITE) 800 MCG tablet, Take 800 mcg by mouth daily., Disp: , Rfl:    furosemide  (LASIX ) 20 MG tablet, Take 1 tablet (20 mg total) by mouth daily as needed for fluid., Disp: 30 tablet, Rfl: 8   ibuprofen (ADVIL,MOTRIN) 200 MG tablet, Take 400 mg by mouth every 6 (six) hours as needed for headache, fever or mild pain., Disp: , Rfl:    levothyroxine  (SYNTHROID ) 50 MCG tablet, TAKE 1 TABLET BY MOUTH DAILY BEFORE BREAKFAST, Disp: 90 tablet, Rfl: 1   Magnesium  400 MG CAPS, Take 250 mg by mouth every Monday, Wednesday, and Friday., Disp: , Rfl:    metoprolol  succinate (TOPROL -XL) 25 MG 24 hr tablet, Take 0.5 tablets (12.5 mg total) by mouth daily. (Patient taking differently: Take 12.5 mg by mouth at bedtime.), Disp: 45 tablet, Rfl: 3   metroNIDAZOLE (METROGEL) 0.75 % gel, Apply 1 application topically at bedtime., Disp: , Rfl:    naphazoline-pheniramine (VISINE) 0.025-0.3 % ophthalmic solution, Place 1 drop into both eyes as needed for eye irritation., Disp: , Rfl:    sacubitril -valsartan  (ENTRESTO ) 24-26 MG, Take 1 tablet by mouth 2 (two) times daily., Disp: 180 tablet, Rfl: 2   vitamin E 180 MG (400 UNITS) capsule, Take 400 Units by mouth daily., Disp: , Rfl:   "

## 2024-04-16 ENCOUNTER — Ambulatory Visit: Payer: Self-pay | Admitting: Internal Medicine

## 2024-10-09 ENCOUNTER — Other Ambulatory Visit (HOSPITAL_COMMUNITY)
